# Patient Record
Sex: Male | Born: 1935 | Race: Black or African American | Hispanic: No | Marital: Married | Smoking: Never smoker
Health system: Southern US, Community
[De-identification: ages and names within clinical notes are randomized; demographics above are authoritative.]

## PROBLEM LIST (undated history)

## (undated) DIAGNOSIS — R569 Unspecified convulsions: Secondary | ICD-10-CM

## (undated) DIAGNOSIS — G309 Alzheimer's disease, unspecified: Secondary | ICD-10-CM

## (undated) DIAGNOSIS — F0280 Dementia in other diseases classified elsewhere without behavioral disturbance: Secondary | ICD-10-CM

## (undated) HISTORY — DX: Dementia in other diseases classified elsewhere, unspecified severity, without behavioral disturbance, psychotic disturbance, mood disturbance, and anxiety: F02.80

## (undated) HISTORY — PX: KNEE SURGERY: SHX244

## (undated) HISTORY — DX: Alzheimer's disease, unspecified: G30.9

---

## 2001-04-10 ENCOUNTER — Encounter: Admission: RE | Admit: 2001-04-10 | Discharge: 2001-04-10 | Payer: Self-pay | Admitting: Internal Medicine

## 2001-04-10 ENCOUNTER — Encounter: Payer: Self-pay | Admitting: Internal Medicine

## 2001-04-19 ENCOUNTER — Ambulatory Visit (HOSPITAL_COMMUNITY): Admission: RE | Admit: 2001-04-19 | Discharge: 2001-04-19 | Payer: Self-pay | Admitting: Otolaryngology

## 2001-04-19 ENCOUNTER — Encounter (INDEPENDENT_AMBULATORY_CARE_PROVIDER_SITE_OTHER): Payer: Self-pay | Admitting: *Deleted

## 2001-04-27 ENCOUNTER — Encounter: Admission: RE | Admit: 2001-04-27 | Discharge: 2001-04-27 | Payer: Self-pay | Admitting: Otolaryngology

## 2001-04-27 ENCOUNTER — Encounter: Payer: Self-pay | Admitting: Otolaryngology

## 2001-05-11 ENCOUNTER — Emergency Department (HOSPITAL_COMMUNITY): Admission: EM | Admit: 2001-05-11 | Discharge: 2001-05-11 | Payer: Self-pay | Admitting: Emergency Medicine

## 2001-05-16 ENCOUNTER — Encounter: Admission: RE | Admit: 2001-05-16 | Discharge: 2001-05-16 | Payer: Self-pay | Admitting: Hematology and Oncology

## 2001-05-16 ENCOUNTER — Encounter: Payer: Self-pay | Admitting: Hematology and Oncology

## 2001-05-18 ENCOUNTER — Emergency Department (HOSPITAL_COMMUNITY): Admission: EM | Admit: 2001-05-18 | Discharge: 2001-05-18 | Payer: Self-pay

## 2001-05-18 ENCOUNTER — Ambulatory Visit: Admission: RE | Admit: 2001-05-18 | Discharge: 2001-08-16 | Payer: Self-pay | Admitting: Radiation Oncology

## 2001-06-29 ENCOUNTER — Emergency Department (HOSPITAL_COMMUNITY): Admission: EM | Admit: 2001-06-29 | Discharge: 2001-06-29 | Payer: Self-pay | Admitting: Emergency Medicine

## 2001-07-28 ENCOUNTER — Encounter: Payer: Self-pay | Admitting: Otolaryngology

## 2001-07-28 ENCOUNTER — Ambulatory Visit (HOSPITAL_COMMUNITY): Admission: RE | Admit: 2001-07-28 | Discharge: 2001-07-28 | Payer: Self-pay | Admitting: Otolaryngology

## 2001-08-02 ENCOUNTER — Inpatient Hospital Stay (HOSPITAL_COMMUNITY): Admission: RE | Admit: 2001-08-02 | Discharge: 2001-10-10 | Payer: Self-pay | Admitting: Otolaryngology

## 2001-08-02 ENCOUNTER — Encounter: Payer: Self-pay | Admitting: Neurosurgery

## 2001-08-02 ENCOUNTER — Encounter (INDEPENDENT_AMBULATORY_CARE_PROVIDER_SITE_OTHER): Payer: Self-pay | Admitting: *Deleted

## 2001-08-03 ENCOUNTER — Encounter: Payer: Self-pay | Admitting: Neurosurgery

## 2001-08-07 ENCOUNTER — Encounter: Payer: Self-pay | Admitting: Neurosurgery

## 2001-08-07 ENCOUNTER — Encounter: Payer: Self-pay | Admitting: Otolaryngology

## 2001-08-08 ENCOUNTER — Encounter: Payer: Self-pay | Admitting: Critical Care Medicine

## 2001-08-09 ENCOUNTER — Encounter: Payer: Self-pay | Admitting: Critical Care Medicine

## 2001-08-10 ENCOUNTER — Encounter: Payer: Self-pay | Admitting: Critical Care Medicine

## 2001-08-11 ENCOUNTER — Encounter: Payer: Self-pay | Admitting: Critical Care Medicine

## 2001-08-11 ENCOUNTER — Encounter: Payer: Self-pay | Admitting: Pulmonary Disease

## 2001-08-12 ENCOUNTER — Encounter: Payer: Self-pay | Admitting: Otolaryngology

## 2001-08-13 ENCOUNTER — Encounter: Payer: Self-pay | Admitting: Neurosurgery

## 2001-08-13 ENCOUNTER — Encounter: Payer: Self-pay | Admitting: Otolaryngology

## 2001-08-14 ENCOUNTER — Encounter: Payer: Self-pay | Admitting: Otolaryngology

## 2001-08-15 ENCOUNTER — Encounter: Payer: Self-pay | Admitting: Otolaryngology

## 2001-08-16 ENCOUNTER — Encounter: Payer: Self-pay | Admitting: Pulmonary Disease

## 2001-08-17 ENCOUNTER — Encounter: Payer: Self-pay | Admitting: Otolaryngology

## 2001-08-18 ENCOUNTER — Encounter: Payer: Self-pay | Admitting: Neurosurgery

## 2001-08-18 ENCOUNTER — Encounter: Payer: Self-pay | Admitting: Otolaryngology

## 2001-08-18 ENCOUNTER — Encounter: Payer: Self-pay | Admitting: Pulmonary Disease

## 2001-08-19 ENCOUNTER — Encounter: Payer: Self-pay | Admitting: Otolaryngology

## 2001-08-20 ENCOUNTER — Encounter: Payer: Self-pay | Admitting: Neurosurgery

## 2001-08-21 ENCOUNTER — Encounter: Payer: Self-pay | Admitting: Otolaryngology

## 2001-08-21 ENCOUNTER — Encounter: Payer: Self-pay | Admitting: Neurosurgery

## 2001-08-22 ENCOUNTER — Encounter: Payer: Self-pay | Admitting: Otolaryngology

## 2001-08-24 ENCOUNTER — Encounter: Payer: Self-pay | Admitting: Critical Care Medicine

## 2001-08-25 ENCOUNTER — Encounter: Payer: Self-pay | Admitting: Otolaryngology

## 2001-08-26 ENCOUNTER — Encounter: Payer: Self-pay | Admitting: Pulmonary Disease

## 2001-08-27 ENCOUNTER — Encounter: Payer: Self-pay | Admitting: Neurosurgery

## 2001-08-28 ENCOUNTER — Encounter: Payer: Self-pay | Admitting: Neurosurgery

## 2001-08-29 ENCOUNTER — Encounter: Payer: Self-pay | Admitting: Neurosurgery

## 2001-08-30 ENCOUNTER — Encounter: Payer: Self-pay | Admitting: Otolaryngology

## 2001-08-31 ENCOUNTER — Encounter: Payer: Self-pay | Admitting: Otolaryngology

## 2001-09-01 ENCOUNTER — Encounter: Payer: Self-pay | Admitting: Neurosurgery

## 2001-09-03 ENCOUNTER — Encounter: Payer: Self-pay | Admitting: Otolaryngology

## 2001-09-03 ENCOUNTER — Encounter: Payer: Self-pay | Admitting: Pulmonary Disease

## 2001-09-04 ENCOUNTER — Encounter: Payer: Self-pay | Admitting: Otolaryngology

## 2001-09-05 ENCOUNTER — Encounter: Payer: Self-pay | Admitting: Pulmonary Disease

## 2001-09-06 ENCOUNTER — Encounter: Payer: Self-pay | Admitting: Otolaryngology

## 2001-09-07 ENCOUNTER — Encounter: Payer: Self-pay | Admitting: Otolaryngology

## 2001-09-07 ENCOUNTER — Encounter: Payer: Self-pay | Admitting: Pulmonary Disease

## 2001-09-08 ENCOUNTER — Encounter: Payer: Self-pay | Admitting: Otolaryngology

## 2001-09-09 ENCOUNTER — Encounter: Payer: Self-pay | Admitting: Otolaryngology

## 2001-09-10 ENCOUNTER — Encounter: Payer: Self-pay | Admitting: Neurosurgery

## 2001-09-11 ENCOUNTER — Encounter: Payer: Self-pay | Admitting: *Deleted

## 2001-09-11 ENCOUNTER — Encounter: Payer: Self-pay | Admitting: Neurosurgery

## 2001-09-12 ENCOUNTER — Encounter: Payer: Self-pay | Admitting: Neurosurgery

## 2001-09-12 ENCOUNTER — Encounter: Payer: Self-pay | Admitting: Critical Care Medicine

## 2001-09-13 ENCOUNTER — Encounter: Payer: Self-pay | Admitting: Neurosurgery

## 2001-09-14 ENCOUNTER — Encounter: Payer: Self-pay | Admitting: Otolaryngology

## 2001-09-14 ENCOUNTER — Encounter: Payer: Self-pay | Admitting: Neurosurgery

## 2001-09-15 ENCOUNTER — Encounter: Payer: Self-pay | Admitting: Critical Care Medicine

## 2001-09-15 ENCOUNTER — Encounter: Payer: Self-pay | Admitting: Otolaryngology

## 2001-09-16 ENCOUNTER — Encounter: Payer: Self-pay | Admitting: Otolaryngology

## 2001-09-16 ENCOUNTER — Encounter: Payer: Self-pay | Admitting: Neurosurgery

## 2001-09-18 ENCOUNTER — Encounter: Payer: Self-pay | Admitting: Neurosurgery

## 2001-09-19 ENCOUNTER — Encounter: Payer: Self-pay | Admitting: Pulmonary Disease

## 2001-09-21 ENCOUNTER — Encounter: Payer: Self-pay | Admitting: Otolaryngology

## 2001-09-23 ENCOUNTER — Encounter: Payer: Self-pay | Admitting: Otolaryngology

## 2001-09-25 ENCOUNTER — Encounter: Payer: Self-pay | Admitting: Otolaryngology

## 2001-09-27 ENCOUNTER — Encounter: Payer: Self-pay | Admitting: Otolaryngology

## 2001-09-28 ENCOUNTER — Encounter: Payer: Self-pay | Admitting: Otolaryngology

## 2001-09-30 ENCOUNTER — Encounter: Payer: Self-pay | Admitting: Otolaryngology

## 2001-11-06 ENCOUNTER — Emergency Department (HOSPITAL_COMMUNITY): Admission: EM | Admit: 2001-11-06 | Discharge: 2001-11-07 | Payer: Self-pay | Admitting: *Deleted

## 2002-01-09 ENCOUNTER — Inpatient Hospital Stay (HOSPITAL_COMMUNITY): Admission: EM | Admit: 2002-01-09 | Discharge: 2002-01-23 | Payer: Self-pay | Admitting: Emergency Medicine

## 2002-01-09 ENCOUNTER — Encounter: Payer: Self-pay | Admitting: Emergency Medicine

## 2002-01-10 ENCOUNTER — Encounter: Payer: Self-pay | Admitting: Neurosurgery

## 2002-01-11 ENCOUNTER — Encounter: Payer: Self-pay | Admitting: Neurosurgery

## 2002-02-05 ENCOUNTER — Inpatient Hospital Stay (HOSPITAL_COMMUNITY): Admission: EM | Admit: 2002-02-05 | Discharge: 2002-02-06 | Payer: Self-pay | Admitting: Emergency Medicine

## 2002-02-05 ENCOUNTER — Encounter: Payer: Self-pay | Admitting: Emergency Medicine

## 2002-02-12 ENCOUNTER — Emergency Department (HOSPITAL_COMMUNITY): Admission: EM | Admit: 2002-02-12 | Discharge: 2002-02-12 | Payer: Self-pay | Admitting: Emergency Medicine

## 2002-05-18 ENCOUNTER — Ambulatory Visit (HOSPITAL_COMMUNITY): Admission: RE | Admit: 2002-05-18 | Discharge: 2002-05-18 | Payer: Self-pay | Admitting: Gastroenterology

## 2002-07-04 ENCOUNTER — Emergency Department (HOSPITAL_COMMUNITY): Admission: EM | Admit: 2002-07-04 | Discharge: 2002-07-04 | Payer: Self-pay | Admitting: Emergency Medicine

## 2002-07-04 ENCOUNTER — Encounter: Payer: Self-pay | Admitting: Emergency Medicine

## 2002-07-20 ENCOUNTER — Encounter: Payer: Self-pay | Admitting: Internal Medicine

## 2002-07-20 ENCOUNTER — Ambulatory Visit (HOSPITAL_COMMUNITY): Admission: RE | Admit: 2002-07-20 | Discharge: 2002-07-20 | Payer: Self-pay | Admitting: Internal Medicine

## 2002-11-15 ENCOUNTER — Ambulatory Visit: Admission: RE | Admit: 2002-11-15 | Discharge: 2002-11-15 | Payer: Self-pay | Admitting: Radiation Oncology

## 2002-11-28 ENCOUNTER — Ambulatory Visit: Admission: RE | Admit: 2002-11-28 | Discharge: 2002-11-28 | Payer: Self-pay | Admitting: Radiation Oncology

## 2003-01-09 ENCOUNTER — Ambulatory Visit: Admission: RE | Admit: 2003-01-09 | Discharge: 2003-01-09 | Payer: Self-pay | Admitting: Radiation Oncology

## 2003-01-15 ENCOUNTER — Ambulatory Visit (HOSPITAL_COMMUNITY): Admission: RE | Admit: 2003-01-15 | Discharge: 2003-01-15 | Payer: Self-pay | Admitting: Radiation Oncology

## 2003-01-15 ENCOUNTER — Encounter: Payer: Self-pay | Admitting: Radiation Oncology

## 2003-01-16 ENCOUNTER — Ambulatory Visit (HOSPITAL_COMMUNITY): Admission: RE | Admit: 2003-01-16 | Discharge: 2003-01-16 | Payer: Self-pay | Admitting: Critical Care Medicine

## 2003-01-16 ENCOUNTER — Encounter: Payer: Self-pay | Admitting: Critical Care Medicine

## 2004-01-30 ENCOUNTER — Ambulatory Visit (HOSPITAL_COMMUNITY): Admission: RE | Admit: 2004-01-30 | Discharge: 2004-01-30 | Payer: Self-pay | Admitting: Radiation Oncology

## 2004-01-30 ENCOUNTER — Ambulatory Visit: Admission: RE | Admit: 2004-01-30 | Discharge: 2004-01-30 | Payer: Self-pay | Admitting: Radiation Oncology

## 2004-03-17 ENCOUNTER — Ambulatory Visit (HOSPITAL_COMMUNITY): Admission: RE | Admit: 2004-03-17 | Discharge: 2004-03-17 | Payer: Self-pay | Admitting: Radiation Oncology

## 2004-07-16 ENCOUNTER — Ambulatory Visit: Admission: RE | Admit: 2004-07-16 | Discharge: 2004-07-16 | Payer: Self-pay | Admitting: Radiation Oncology

## 2004-07-25 ENCOUNTER — Emergency Department (HOSPITAL_COMMUNITY): Admission: EM | Admit: 2004-07-25 | Discharge: 2004-07-25 | Payer: Self-pay | Admitting: Emergency Medicine

## 2004-07-30 ENCOUNTER — Emergency Department (HOSPITAL_COMMUNITY): Admission: EM | Admit: 2004-07-30 | Discharge: 2004-07-30 | Payer: Self-pay | Admitting: Emergency Medicine

## 2005-01-14 ENCOUNTER — Ambulatory Visit (HOSPITAL_COMMUNITY): Admission: RE | Admit: 2005-01-14 | Discharge: 2005-01-14 | Payer: Self-pay | Admitting: Radiation Oncology

## 2005-01-14 ENCOUNTER — Ambulatory Visit: Admission: RE | Admit: 2005-01-14 | Discharge: 2005-01-14 | Payer: Self-pay | Admitting: Radiation Oncology

## 2005-01-29 ENCOUNTER — Ambulatory Visit (HOSPITAL_COMMUNITY): Admission: RE | Admit: 2005-01-29 | Discharge: 2005-01-29 | Payer: Self-pay | Admitting: Radiation Oncology

## 2005-05-03 ENCOUNTER — Encounter: Admission: RE | Admit: 2005-05-03 | Discharge: 2005-05-03 | Payer: Self-pay | Admitting: Internal Medicine

## 2005-07-22 ENCOUNTER — Ambulatory Visit: Admission: RE | Admit: 2005-07-22 | Discharge: 2005-08-05 | Payer: Self-pay | Admitting: Otolaryngology

## 2006-01-20 ENCOUNTER — Ambulatory Visit (HOSPITAL_COMMUNITY): Admission: RE | Admit: 2006-01-20 | Discharge: 2006-01-20 | Payer: Self-pay | Admitting: Radiation Oncology

## 2006-01-27 ENCOUNTER — Ambulatory Visit (HOSPITAL_COMMUNITY): Admission: RE | Admit: 2006-01-27 | Discharge: 2006-01-27 | Payer: Self-pay | Admitting: Radiation Oncology

## 2007-01-31 ENCOUNTER — Ambulatory Visit (HOSPITAL_COMMUNITY): Admission: RE | Admit: 2007-01-31 | Discharge: 2007-01-31 | Payer: Self-pay | Admitting: Radiation Oncology

## 2007-08-23 ENCOUNTER — Emergency Department (HOSPITAL_COMMUNITY): Admission: EM | Admit: 2007-08-23 | Discharge: 2007-08-23 | Payer: Self-pay | Admitting: Emergency Medicine

## 2008-02-08 ENCOUNTER — Ambulatory Visit (HOSPITAL_COMMUNITY): Admission: RE | Admit: 2008-02-08 | Discharge: 2008-02-08 | Payer: Self-pay | Admitting: Radiation Oncology

## 2008-05-31 ENCOUNTER — Emergency Department (HOSPITAL_COMMUNITY): Admission: EM | Admit: 2008-05-31 | Discharge: 2008-05-31 | Payer: Self-pay | Admitting: Emergency Medicine

## 2009-10-03 ENCOUNTER — Ambulatory Visit (HOSPITAL_COMMUNITY): Admission: RE | Admit: 2009-10-03 | Discharge: 2009-10-03 | Payer: Self-pay | Admitting: Internal Medicine

## 2010-09-29 ENCOUNTER — Encounter: Admission: RE | Admit: 2010-09-29 | Discharge: 2010-09-29 | Payer: Self-pay | Admitting: Internal Medicine

## 2010-10-08 ENCOUNTER — Ambulatory Visit (HOSPITAL_COMMUNITY)
Admission: RE | Admit: 2010-10-08 | Discharge: 2010-10-08 | Payer: Self-pay | Source: Home / Self Care | Admitting: Gastroenterology

## 2010-10-16 ENCOUNTER — Ambulatory Visit (HOSPITAL_COMMUNITY)
Admission: RE | Admit: 2010-10-16 | Discharge: 2010-10-16 | Payer: Self-pay | Source: Home / Self Care | Attending: Gastroenterology | Admitting: Gastroenterology

## 2011-03-22 ENCOUNTER — Ambulatory Visit (HOSPITAL_COMMUNITY): Payer: Medicare Other | Attending: Internal Medicine

## 2011-03-22 DIAGNOSIS — M810 Age-related osteoporosis without current pathological fracture: Secondary | ICD-10-CM | POA: Insufficient documentation

## 2011-03-25 ENCOUNTER — Ambulatory Visit
Admission: RE | Admit: 2011-03-25 | Discharge: 2011-03-25 | Disposition: A | Discharge status: Home / Self Care | Payer: Medicare Other | Source: Ambulatory Visit | Attending: Radiation Oncology | Admitting: Radiation Oncology

## 2011-03-26 NOTE — Discharge Summary (Signed)
[place]. [facility]  Patient:    Erik Massey, Erik Massey Visit Number: [id number] MRN: [id number]          Service Type: SUR Location: 3000 [id number] 01 Attending Physician:  Susy Frizzle Dictated by:   Jeannett Senior Pollyann Kennedy, M.D. Admit Date:  08/02/2001 Disc. Date: 10/09/01                             Discharge Summary  ADMISSION DIAGNOSIS:  Esthesioneuroblastoma.  DISCHARGE DIAGNOSES: 1. Status post anterior craniofacial resection. 2. Status post severe pneumonia and adult respiratory distress syndrome.  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  He underwent preoperative radiotherapy.  He then was admitted to the hospital to undergo resection of the anterior skull base and the olfactory apparatus via combined neurosurgical and transfacial approach. He tolerated the surgery well.  The surgery was performed on the day of admission.  Postoperatively, he was extubated on postoperative day #2.  He, over the following 48 hours, developed severe respiratory distress.  He developed a severe pneumonia and respiratory failure.  He was then kept in the neurosurgical intensive care unit and treated for several weeks with antibiotics and ventilatory support as well as high dose steroids.  He developed bilateral pneumothoraces.  He was followed by several different services, including pulmonary critical care, ENT, and neurosurgery.  He eventually required tracheostomy for prolonged intubation.  He was also followed by the infectious disease service for the severe Pseudomonas pneumonia.  He was kept sedated, and, at times, paralyzed during the intubation.  He developed worsening weakness of the right side, right upper and lower extremity, and worsening trouble with his speech which he had, to some extent, preoperatively from a past injury to the head.   The other postoperative complication included on November 4, he had a seizure.  He has been treated with Dilantin since then without any further such events.  He was eventually started back on a regular diet when he was found that he could swallow and process food intraorally well.  He had a residual pneumothorax on the right side that was followed with serial x-rays and eventually it completely resolved.  He underwent physical therapy and rehabilitation for the right-sided weakness.  The tracheostomy was eventually removed.  The chest tubes and central lines were eventually removed as well.  PLAN:  Discharge to the nursing home for continued rehabilitation on the morning of December 3.  DISCHARGE MEDICATIONS:  He is instructed to continue use of the following medications:  1. Nasal saline spray several times each day.  2. Betaxolol eye drops 1 drop twice daily.  3. _____ eye drops 0.15% ophthalmic solution 1 drop t.i.d.  4. Xalatan ophthalmic 1 drop at bedtime.  5. Dilantin 100 mg 4 times daily.  6. Protonix 40 mg daily.  7. Flomax 0.4 mg at bedtime.  8. Proscar 4 mg each day.  9. Celebrex 200 mg daily. 10. Exelon 1.5 mg twice daily.  DIET:  He is to continue with a regular diet.  FOLLOWUP:  He is instructed to follow up with Dr. Pollyann Kennedy in about two or three weeks.  He will follow up with Dr. Mikal Plane as recommended by Dr. Mikal Plane.  For any other problems, his wife was instructed to contact myself. Dictated by:   Jeannett Senior Pollyann Kennedy, M.D. Attending Physician:  Serena Colonel H DD:  10/09/01 TD:  10/09/01 Job: [id number]

## 2011-03-26 NOTE — H&P (Signed)
[place]. [facility]  Patient:    ACESON, LABELL Visit Number: [id number] MRN: [id number]          Service Type: SUR Location: [phone] 01 Attending Physician:  Susy Frizzle Dictated by:   Jeannett Senior Pollyann Kennedy, M.D. Admit Date:  08/02/2001 Discharge Date: 10/10/2001                           History and Physical  ADMISSION DIAGNOSIS:  Esthesioneuroblastoma.  SERVICE:  ENT.  ATTENDING PHYSICIAN:  Jefry H. Pollyann Kennedy, M.D.  REFERRING PHYSICIAN:  Kern Reap, M.D.  HISTORY:  This is a 77 year old gentleman who presented to me with frequent nosebleeds.  He was found to have an erosive mass in the nasal cavity on the right side.  Biopsies were performed and imaging studies were performed, revealed an esthesioneuroblastoma with minimal invasion through the cribriform plate.  He underwent preoperative radiotherapy without difficulty.  He is admitted to the hospital to undergo definitive surgical resection via an anterior craniofacial approach combined with Dr. Coletta Memos of neurosurgery.  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  He is otherwise in good health.  He has some dementia that is felt to be related to Alzheimers.  He has some difficulty with his remaining right eye.  SOCIAL HISTORY:  He does not smoke or drink significant amounts.  He lives with his wife.  He lives in [place] and his daughter is a Development worker, community here in [place].  PHYSICAL EXAMINATION:  GENERAL:  He is a healthy-appearing elderly gentleman.  HEENT:  He is missing his left eye.  There is a cranial defect on the left temporoparietal area as well.  The right eye is normal to inspection.  No palpable adenopathy in the neck.  Oral cavity and pharynx and nasal cavities are clear to inspection.  CHEST:  Clear to auscultation.  HEART:   Regular rate and rhythm.  ABDOMEN:  Soft without any distention or masses.  EXTREMITIES:  Unremarkable except for weakness of the right side.  IMPRESSION:  Esthesioneuroblastoma.  PLAN:  Admit to the hospital to undergo definitive surgical removal. Dictated by:   Jeannett Senior. Pollyann Kennedy, M.D. Attending Physician:  Susy Frizzle DD:  10/20/01 TD:  10/20/01 Job: [id number]

## 2011-03-26 NOTE — Cardiovascular Report (Signed)
[place]. [facility]  Patient:    Erik, Massey Visit Number: [id number] MRN: [id number]          Service Type: MED Location: [phone] Attending Physician:  Erik Massey Dictated by:   Erik Massey, M.D. Proc. Date: 02/06/02 Admit Date:  02/05/2002   CC:         Erik Massey, M.D.  Kern Reap, M.D.  Orville Govern; Dr. Ellin Goodie office   Cardiac Catheterization  INDICATIONS:  Erik Massey is a 77 year old African-American male father of Erik Massey who resides at extended care.  The patient was admitted to [facility] yesterday with episodes of chest pain radiating to his neck associated with shortness of breath.  The patient has noticed intermittent chest pain off and on for approximately a month according to the patients wife.  Initial CK total was normal at 58.  However, the MB fraction was 5.5.  Initial troponin was elevated at 0.09.  Yesterday, an attempt was made to undergo cardiac catheterization.  However, the patient does have Alzheimers disease.  Numerous discussions were made with the patients wife, as well as the patients daughter, Dr. Renae Massey, as well as the patient discussing the procedure.  Ultimately, the patient did not allow him to be prepped in the catheterization laboratory despite Dr. Renae Massey coming to the lab.  After Dr. Renae Massey had left, the patient decided against the procedure.  Dr. Renae Massey and the patients wife had discussed matters again with the patient, as well as myself, and the patient is now brought to the catheterization laboratory with both his wife and Erik Massey in presence during the catheterization procedure.  PROCEDURE:  After premedication with Valium 4 mg intravenously, the patient was prepped and draped in the usual fashion.  The right femoral artery was punctured anteriorly and a 6-French sheath was inserted.  Diagnostic catheterization was done with a  6-French Judkins-4 left, and a right coronary catheter.  A 6-French pigtail catheter was used for biplane cine ventriculography as well as distal aortography.  Hemostasis was obtained by direct manual pressure.  HEMODYNAMIC DATA:  Central aortic pressure 134/74 and mean 101.  Left ventricular pressure 134/18.  ANGIOGRAPHIC DATA: 1.  Left main coronary artery was angiographically normal and trifurcated     into an LAD, a small intermediate, and a large dominant left circumflex     system. 2.  The LAD gave rise to a very high ostial first diagonal vessel.  The LAD     gave rise to three additional diagonal vessels; the second diagonal being     the largest.  In the apical portion of the LAD, beyond a diagonal vessel,     the apical portion of the LAD was diminutive and appeared perhaps to be     subtotally occluded.  There was faint collateralization seen of the apical     portion through a more proximal distal septal perforator. 3.  The intermediate vessel was angiographically normal and small in caliber. 4.  The circumflex vessel was a large caliber dominant vessel that gave rise     to two large marginal vessels as well as a PDA and posterolateral system. 5.  The right coronary artery was a non-dominant angiographically normal vessel     supplying the anterior RV, giving rise to an anterior RV artery, but not     supplying the PDA or PLA system. 7.  Biplane cine ventriculography revealed normal LV function.  There was a     suggestion of a borderline angiographic mitral valve prolapse.  Ejection     fraction was 70%.  No focal wall motion abnormalities were demonstrated. 8.  Distal aortography did not demonstrate any renal artery stenosis.  There     was no significant aortoiliac disease.  IMPRESSION: 1. Normal LV function. 2. Left dominant coronary system with essentially normal coronary arteries    with the exception of the most distal portion of the LAD which was    diminutive  in caliber and appeared to be subtotally occluded at the apex    with collateralization. 3. No evidence for renal artery stenosis or significant aortoiliac disease.  RECOMMENDATION:  Medical therapy. Dictated by:   Erik Massey, M.D. Attending Physician:  Erik Massey DD:  02/06/02 TD:  02/06/02 Job: [id number]

## 2011-03-26 NOTE — Discharge Summary (Signed)
[place]. [facility]  Patient:    Erik Massey, FIGG Visit Number: [id number] MRN: [id number]          Service Type: MED Location: [phone] Attending Physician:  Netta Cedars Dictated by:   Raymon Mutton, P.A. Admit Date:  02/05/2002 Disc. Date: 02/06/02   CC:         Lennette Bihari, M.D.   Discharge Summary  DATE OF BIRTH:  [birth year]  ADMITTING PHYSICIAN:  Netta Cedars, M.D.  DISCHARGING PHYSICIAN:  Dr. Tresa Endo.  DISCHARGE DIAGNOSES:  1. Chest pain, rule out myocardial infarction.  2. Status post coronary angiography.  3. Seizure disorder.  4. Status post ___________ neuroblastoma removal from the anterior skull     base.  5. Status post olfactory apparatus excision.  6. History of severe fall with left subdural hematoma and bilateral frontal     contusions now with residual right hemiparesis.  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  8. Right eye glaucoma.  9. Blindness of the left eye. 10. History of adult respiratory distress syndrome after ___________     neuroblastoma excision with bilateral pneumothoraces and prolonged     ventilatory requirement. 11. Status post __________. 12. Benign prostatic hypertrophy. 13. History of peptic ulcer disease.  HISTORY OF PRESENT ILLNESS:  Erik Massey is a 77 year old African American gentleman who is a resident of extended care facility.  He developed an episode of chest pain and neck pain with shortness of breath yesterday at 8 oclock in the morning and nurse called reporting the patients status.  Also his wife stated that he has been complaining of chest discomfort for a month or so and otherwise he was his usual state of health with no significant changes in the medical status.  In the emergency room on presentation, the patient did not complain of any chest pain and did not complain of shortness of breath.  EKG tracing was not remarkable but cardiac panel  revealed elevated CK-MB 5.5 and also elevated troponin I at 0.09, but CK fraction was within normal limits at 58.  EKG did not show any acute ST or T-wave changes.  No abnormalities other than slightly elevated heart rate at 94.  His blood pressure was within normal range, 137 systolic and 64 diastolic.  His sats were okay at 98% at room air.  He was admitted for observation and on rule out MI protocol.  LABORATORY DATA:  Sets of cardiac enzymes revealed the following:  CPK 37 and 59 on the second and third set; CK-MB 5.3 and 4.7 on second and third set, respectively; troponin I remained stable 0.08 on both second and third sets. His metabolic panel revealed BUN of 14, creatinine 0.9, sodium 141, potassium 3.7, glucose 96 this morning.  Liver function tests showed SGOT 18 and SGPT 13, _________ which was within normal range and alkaline phosphatase was 159 yesterday evening and 186 on admission, which was __________ 117.  CBC showed hemoglobin 11.7, hematocrit 34.0, platelets 207 and white blood cell count 7.4.  HOSPITAL COURSE:  Today the patient underwent coronary angiography performed by Lennette Bihari, M.D., the result of which is unavailable to me at the time of dictation but the diagram revealed no significant coronary artery disease, essentially normal coronaries except some diminished noted coronary flow and distal LAD with collateral circulation compensated with this occlusion.  Ejection fraction estimated at 70%.  Cath was performed with no difficulty and the patient developed no  complications.  The patient did not develop any complications.  For more detailed information, please refer to that dictation. He maintained sinus rhythm throughout admission but heart rate was borderline high.  PLAN:  The patient will be on continued bedrest until his post cath bedrest time expires and if stable, will be transferred back to extended care unit.  DISCHARGE MEDICATIONS:  1.  _________ 1 mg q.d.  2. Protonix 40 mg q.d.  3. Toprol XL 25 mg q.d.  4. Indocin 50 mg q.d.  5. Flomax 0.4 mg q.d.  6. Xalatan 0.05% one drop ocular dextra  7. Alphagan 0.2% one drop t.i.d. ocular dextra.  8. Betoptic ophthalmic solution 0.5% one drop b.i.d. ocular dextra.  9. Zocor 10 mg q.h.s. 10. Tegretol 400 mg b.i.d. 11. Exelon 3 mg b.i.d.  DISCHARGE ACTIVITY:  No strenuous activity for 72 hours after cath.  WOUND CARE:  The patient was allowed to take a shower but no soaks, no baths, and no rubbing of the groin puncture site, pat dry.  DISCHARGE DIET:  A 4 g sodium chloride modified diet.  SPECIAL INSTRUCTIONS:  Report any redness, bleeding, swelling or increased pain to Dr. Tresa Endo and phone number was provided.  DISCHARGE FOLLOW-UP:  He will follow up p.r.n. after cath.  Lipid status is unavailable at the time of discharge and is still pending. Dictated by:   Raymon Mutton, P.A. Attending Physician:  Netta Cedars DD:  02/06/02 TD:  02/06/02 Job: [id number]

## 2011-03-26 NOTE — Discharge Summary (Signed)
[place]. [facility]  Patient:    OLANREWAJU, OSBORN Visit Number: [id number] MRN: [id number]          Service Type: MED Location: 3000 3029 01 Attending Physician:  Olene Craven Dictated by:   Kern Reap, M.D. Admit Date:  01/09/2002 Disc. Date: 01/23/02                             Discharge Summary  ADDENDUM TO DISCHARGE SUMMARY #[id number]  DISCHARGE MEDICATIONS:  1. Flomax 0.4 mg p.o. q.d.  2. Betoptic 0.5% solution one drop OD b.i.d.  3. Xalatan ophthalmic 0.005% one drop OD q.h.s.  4. Albuterol nebulizer 2.5 mg q.6h. p.r.n.  5. Atrovent nebulizer 0.5 mg q.6h. p.r.n.  6. Protonix 40 mg p.o. q.d.  7. Alphagan 0.2% solution one drop OD q.8h.  8. Tegretol XR 400 mg p.o. b.i.d.  9. Indomethacin 50 mg p.o. b.i.d. with food. 10. Exelon 3 mg p.o. b.i.d. with breakfast and dinner. 11. Phenergan 25 mg p.o. q.6h. p.r.n. 12. Tylenol 500 mg p.o. q.6h. p.r.n. Dictated by:   Kern Reap, M.D. Attending Physician:  Olene Craven DD:  01/23/02 TD:  01/23/02 Job: [id number]

## 2011-03-26 NOTE — Op Note (Signed)
[place]. [facility]  Patient:    Erik Massey, Erik Massey Visit Number: [id number] MRN: [id number]          Service Type: SUR Location: [phone] Attending Physician:  Susy Frizzle Dictated by:   Jeannett Senior Pollyann Kennedy, M.D. Proc. Date: 08/21/01 Admit Date:  08/02/2001                             Operative Report  PREOPERATIVE DIAGNOSIS:  Ventilator dependency.  POSTOPERATIVE DIAGNOSIS:  Ventilator dependency.  OPERATION PERFORMED:  Tracheostomy.  SURGEON:  Jefry H. Pollyann Kennedy, M.D.  ANESTHESIA:  General endotracheal.  COMPLICATIONS:  None.  ESTIMATED BLOOD LOSS:  20 cc.  The patient tolerated the procedure well and was transferred back to neurosurgical intensive care unit without any change in his condition.  INDICATIONS FOR PROCEDURE:  The patient is a 77 year old gentleman who underwent resection of a olfactory neuroblastoma three weeks prior and developed pneumonia and adult respiratory distress syndrome postoperatively and has been intubated for slightly over two weeks.  The risks, benefits, alternatives and complications of the procedure were explained to the patients wife, who seemed to understand and agreed to surgery.  DESCRIPTION OF PROCEDURE:  The patient was taken to the operating room and placed on the operating table in supine position.  The patient was previously orotracheally intubated and on intravenous sedation.  1% Xylocaine with epinephrine was infiltrated into the lower cervical skin.  He had a history of tracheostomy about 30 years prior.  A vertical incision was created at the midline through dense scar tissue and electrocautery dissection was continued down through  the midline fascia.  The thyroid gland was not encountered.  The upper trachea was exposed and cleaned of overlying fascial tissue. Tracheostomy incision was created between the second and third tracheal ring, the lower flap was developed and then sutured to the cervical  skin with a chromic suture.  The orotracheal tube was then slowly removed and the airway was suctioned of thick tenacious secretions.  A #8 cuffed Shiley trach tube was placed without difficulty.  Velcro ties were used to secure the trach in place.  Nylon suture was then used to secure the shield to the cervical skin. The patient was then transferred back to the neurosurgical intensive care unit. Dictated by:   Jeannett Senior Pollyann Kennedy, M.D. Attending Physician:  Susy Frizzle DD:  08/21/01 TD:  08/21/01 Job: [id number]

## 2011-03-26 NOTE — Op Note (Signed)
[place]. [facility]  Patient:    Erik Massey, Erik Massey                      MRN: [id number] Proc. Date: 04/19/01 Attending:  Jeannett Senior. Pollyann Kennedy, M.D.                           Operative Report  PREOPERATIVE DIAGNOSIS:  Nasal mass.  POSTOPERATIVE DIAGNOSIS:  Nasal mass.  OPERATION PERFORMED:  Nasal endoscopy with biopsy of right nasal mass.  SURGEON:  Jefry H. Pollyann Kennedy, M.D.  ANESTHESIA:  General endotracheal.  COMPLICATIONS:  None.  FINDINGS:  Large friable mass, vascular in nature and emanating from the superior nasal cavity on the right side.  Frozen section evaluation consistent with ____________ neuroblastoma.  ESTIMATED BLOOD LOSS:  50 cc.  The patient tolerated the procedure well, was awakened, extubated and transferred to recovery in stable condition.  REFERRING PHYSICIAN:  Merlene Laughter. Renae Gloss, M.D.  INDICATIONS FOR PROCEDURE:  The patient is a 77 year old gentleman with a several month history of nasal obstruction and frequent nose bleeds.  The risks, benefits, alternatives and complications of the procedure were explained to the patient and his wife, who seemed to understand and agreed to surgery.  DESCRIPTION OF PROCEDURE:  The patient was taken to the operating room and placed on the operating table in the supine position.  Following induction of general endotracheal anesthesia, the nose was sprayed with Afrin spray.  A 0 degree nasal endoscopy was performed.  Photographs were taken of the nasal mass.  1% Xylocaine with epinephrine with epinephrine was infiltrated into the mass, the upper septum and the superior attachments of the middle turbinate. Large fragments of the mass were taken for biopsy with Blakesley-Weil forceps and sent for frozen section evaluation.  Additional fragments were taken and sent fresh for additional pathologic work-up.  The microdebrider was used to debulk the entire lesion within the nasal cavity and the nasal  cavity was then packed first with compressed Gelfoam up against the base of the tumor and then with a large Merocel pack that was inflated with the local anesthetic solution.  The pharynx was suctioned of blood and secretions.  The patient was then awakened, extubated and transferred to recovery. DD:  04/20/01 TD:  04/20/01 Job: [id number]

## 2011-03-26 NOTE — Op Note (Signed)
[place]. [facility]  Patient:    JANET, DECESARE Visit Number: [id number] MRN: [id number]          Service Type: SUR Location: [phone] Attending Physician:  Susy Frizzle Dictated by:   Mena Goes. Franky Macho, M.D. Proc. Date: 09/06/01 Admit Date:  08/02/2001                             Operative Report  PREOPERATIVE DIAGNOSIS:  Esthesioneuroblastoma, right nasal cavity.  POSTOPERATIVE DIAGNOSIS:  Esthesioneuroblastoma, right nasal cavity.  PROCEDURE: 1. Anterior craniofacial resection of esthesioneuroblastoma. 2. Dual patch grafting with pericranial graft taken from scalp flap.  INDICATIONS FOR PROCEDURE:  Mr. Kahlin Mark is a patient of both Dr. Brynda Peon and mine, who has an esthesioneuroblastoma.  Under a specialized protocol, he has undergone preoperative radiation for treatment of the esthesioneuroblastoma, and is admitted today for a definitive resection via combined neurosurgical ENT approach.  DESCRIPTION OF PROCEDURE:  After Dr. Pollyann Kennedy completed an anterior craniofacial resection by performing lateral rhinotomies, I performed a bifrontal craniotomy, first by making a bifrontal scalp flap extending from the right to the left side just in front of the tragus bilaterally.  After this was done, I developed a scalp flap, pulling that anteriorly, making sure that the pericranium was intact.  I then made two burr holes, one on the right side, and took this over to the midline with a craniotome.  Then using direct visualization, I was able to dissect underneath the scalp flap and resected enough bone on the left side to ensure that we had adequate exposure.  I then developed the anterior flap by dissecting the dura back from the anterior cranial fossa, so I was able to see all of the cribiforme plate.  I took down significant portion of the crista galli in order to perform this.  I was able to retract the dura without great difficulty.   After retracting the dura, I was then able to, with Dr. Brynda Peon, we were able to resect the tumor and block from the anterior fossa.  That portion of the note is also dictated in Dr. Maren Beach note.  After the esthesioneuroblastoma was removed, we then cranialized the frontal sinus bilaterally.  I then developed a pericranial flap and used that to close the opening in the dura which had been made anteriorly in order to retract it backwards.  I then performed a cranial reconstruction using pieces of the craniotomy flap that I had taken, and using skull grafts and using titanium mesh and plates.  When I thought I had achieved a good contour, I then reapproximated the scalp flap with subgaleal sutures.  I was able to close the dura posteriorly primarily using dural sutures.  Dural tackers were applied.  Gelfoam was left over the superior sagittal sinus, and there was excellent hemostasis obtained.  I then finished closing the scalp flap, placed a sterile dressing.  The patient tolerated the procedure well, was taken out of the operating room intubated, but following commands. Dictated by:   Mena Goes. Franky Macho, M.D. Attending Physician:  Susy Frizzle DD:  09/06/01 TD:  09/07/01 Job: [id number]

## 2011-03-26 NOTE — Op Note (Signed)
[place]. [facility]  Patient:    Erik Massey, Erik Massey Visit Number: [id number] MRN: [id number]          Service Type: SUR Location: 3100 3111 01 Attending Physician:  Susy Frizzle Dictated by:   Oley Balm Sung Amabile, M.D. [facility] Proc. Date: 08/18/01 Admit Date:  08/02/2001                             Operative Report  ROOM 3111  DATE OF BIRTH:  September 16, 1929.  PROCEDURE:  Bronchoscopy.  INDICATIONS:  Vent-dependent respiratory failure with right whole lung collapse.  Presumed mucus plugging.  DESCRIPTION OF PROCEDURE:  The patient is sedated and paralyzed on mechanical ventilation.  A bronchoscope was introduced via the endotracheal tube and advanced into the distal trachea.  In this position, abundant secretions were filling up the right main stem bronchus.  These were suctioned with some difficulty due to their viscosity.  It took 15 or 20 minutes of work with multiple passes of the scope to sufficiently clean out the right main stem bronchus and the bronchial tree on the right.  There were mucus plugs emanating from all lung segments on the right.  After completion of the procedure, an airways examination demonstrated diffuse bronchitic changes.  On the left side, there were minimal secretions.  The above specimens were sent for culture and sensitivity.  The patient tolerated the procedure well without complications.  A follow-up chest x-ray will be performed on the morning following this procedure or sooner if indicated. Dictated by:   Oley Balm Sung Amabile, M.D. [facility] Attending Physician:  Susy Frizzle DD:  08/18/01 TD:  08/19/01 Job: [id number]

## 2011-03-26 NOTE — Discharge Summary (Signed)
[place]. [facility]  Patient:    Erik Massey, Erik Massey Visit Number: [id number] MRN: [id number]          Service Type: MED Location: 3000 3029 01 Attending Physician:  Olene Craven Dictated by:   Kern Reap, M.D. Admit Date:  01/09/2002 Disc. Date: 01/23/02   CC:         Deanna Artis. Sharene Skeans, M.D.  Mena Goes. Franky Macho, M.D.  Barbaraann Share, M.D. [facility]   Discharge Summary  DISCHARGE DIAGNOSES:  1. Partial complex seizure, status post status epilepticus.  2. 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  3. 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  4. History of respiratory failure, status post tracheotomy with resection in     separate 2002.  5. Alzheimers dementia.  6. Benign prostatic hypertrophy.  7. Glaucoma.  8. Right upper and lower extremity weakness secondary to closed head injury.  CONSULTANTS:  1. Deanna Artis. Sharene Skeans, M.D., neurology.  2. Ronaldo Miyamoto L. Franky Macho, M.D., neurosurgery.  3. Barbaraann Share, M.D. , pulmonology.  4. Physical therapy.  PROCEDURES:  1. Electroencephalogram showed bifrontal right greater than left hemisphere     slowing, no epileptiform activity.  2. CT scan of the head showed stable appearance of the brain, extensive     bifrontal and left parietal encephalomalacia, and postoperative changes.  HOSPITAL COURSE: The patient was admitted on January 09, 2002 after noted seizure activity.  He was brought to the emergency department and was noted to be in status epilepticus.  He was loaded with Dilantin and given Ativan, and secondary to the seizure and benzodiazepines he had decline of respiratory function and he required intubation.  The patient was intubated and brought up to the intensive care unit, where his seizure subsided.  Fortunately, he was extubated the next day and able to maintain off ventilation.  Dr. Sharene Skeans was consulted and medications were adjusted.   EEG was performed, which showed bifrontal slowing without epileptiform activity.  The patient was transferred to the Floor, where secondary to seizure activity and ICU stay he had a setback in his physical therapy needs.  Physical therapy was consulted to work with the patient and it was decided that the patient would benefit from an extended stay in a rehab center again.  During the course of his hospitalization he remained seizure-free.  He did develop a right elbow possible causalgia pain that was addressed with NSAIDs and a rapid dose of steroids, which did improve.  This was similar to his previous stay in the hospital where his elbow troubled him but improved. PROBLEM #1 - SEIZURES: Dr. Sharene Skeans made the recommendation of switching the patient over to Tegretol.  His Dilantin level was increased during the hospitalization to bring him to more therapeutic range.  He was started on Tegretol.  His Tegretol level rose to 3.4 on 200 mg t.i.d.  He was subsequently switched to Tegretol XR 400 mg b.i.d., for which he will need a follow-up Tegretol level in the next 48 hours and meds adjusted.  Once Tegretol level was therapeutic Dilantin was to be discharged.  Last Dilantin check level was 34, so was subsequently discharged with anticipated achieving of goal of Tegretol.  He is to follow up with Dr. Sharene Skeans in the next two weeks.  PROBLEM #2 - INFUSIO NEUROBLASTOMA: Dr. Franky Macho saw the patient, reviewed the scans, and did not think that any further intervention was necessary at  this time.  PROBLEM #3 - RIGHT ELBOW: The patients elbow did become painful after his stay in the intensive care unit.  He was put on a short steroid taper and treated with NSAIDs, and the patients pain did improve to baseline.  PROBLEM #4 - DEBILITY: The patient was receiving home physical therapy as an outpatient and was doing quite well.  Was able to climb stairs on his own and take care of his ADLs.  With his  seizure and ICU stay he had a setback in terms of his physical therapy and was evaluated by physical therapy and recommended extended care with aggressive physical therapy, for which he is being sent to [facility] extended care.  FOLLOW-UP: The patient is to follow up with Dr. Sharene Skeans in approximately two weeks time.  He is to follow up with Dr. Barbee Shropshire a week after his release from [facility] extended care.  The patient will need a Tegretol level checked in approximately 48 hours, our goal of Tegretol being a level of 428.  He currently is on Tegretol XR 400 mg p.o. b.i.d.  Of note, his Dilantin level the day before discharge was supratherapeutic at 34.  It is being DCd at this time.  He is being discharged to [facility] extended care in stable condition.Dictated by:   Kern Reap, M.D. Attending Physician:  Olene Craven DD:  01/23/02 TD:  01/23/02 Job: [id number]

## 2011-03-26 NOTE — Op Note (Signed)
[place]. [facility]  Patient:    Erik Massey, Erik Massey Visit Number: [id number] MRN: [id number]          Service Type: SUR Location: 3100 3111 01 Attending Physician:  Erik Massey Dictated by:   Erik Massey Erik Massey, M.D. Proc. Date: 08/02/01 Admit Date:  08/02/2001                             Operative Report  PREOPERATIVE DIAGNOSIS:  Esthesia neuroblastoma, right nasal cavity.  POSTOPERATIVE DIAGNOSIS:  Esthesia neuroblastoma, right nasal cavity.  PROCEDURE:  Anterior cranial facial resection.  SURGEONS: 1. Erik Massey, M.D. 2. Erik Massey, M.D.  ASSISTANT:  Erik Massey. Erik Massey, M.D.  ANESTHESIA:  General endotracheal.  COMPLICATIONS:  None.  ESTIMATED BLOOD LOSS:  1000 cc.  FINDINGS:  Perforation of the superior nasal septum, possibly related to radiation therapy that was performed.  Defect in the cribriform plate on the right side corresponding to the location of the origin of the tumor.  No gross residual tumor present.  Frozen section of dura overlying the olfactory apparatus on the right side atypical cells possibly related to radiation, no definite neuroblastoma identified.  The patient tolerated the procedure well, was transferred to the neurosurgical intensive care unit intubated on a ventilator.  HISTORY OF PRESENT ILLNESS:  This is a 77 year old gentleman who presented to me with severe nose bleeds.  Was found on imaging and biopsies to have a right-sided esthesia neuroblastoma.  He had undergone preoperative radiotherapy.  This was according to protocol set up at [facility].  Risks, benefits, alternatives, and complications of the procedure were explained to the patient and his wife who seemed to understand and agreed to surgery.  DESCRIPTION OF PROCEDURE:  The patient was taken to the operating room, placed on the operating table in a supine position.  Following induction of general endotracheal anesthesia, the head  and face was prepped and draped in a standard fashion.  A right lateral rhinotomy approach was used.  Marking pen was used to outline the incision along the right side of nasal dorsum down around the right ala. Electrocautery was used to incise the skin and subcutaneous tissue.  Careful dissection down to the periosteum was accomplished, and the periosteum was sharply incised with a 15 scalpel.  Periosteum was elevated off the nasal bone and the nasal process of the maxilla.  The incision was continued up to a lynch incision, and the periorbita was dissected off of the ethmoid bone.  A corneal protector was used on the right side.  The pupil was checked periodically on the right side to make sure there was a good response.  The anterior ethmoid artery was identified and bipolar cautery was used to cauterize it, and sharp dissection was used to incise the artery.  The posterior ethmoid artery was left intact.  The optic nerve was not identified. The dissection was kept anterior to this.  The lacrimal sac was dissected out of the fossa, but kept intact, and the bony resection was just above the lacrimal fossa.  A lynch incision was accomplished on the left side, and a similar dissection was accomplished, also dividing the anterior ethmoid artery.  He has a prosthetic eye on the left side, and there is no need for checking pupillary function.  Bony cuts were created using combinations of curved, guarded, and straight osteotomes.  Starting the piriform appature on both sides, osteotomies  were accomplished up to approximately the level of the frontal ethmoid suture and then across the base of the nasal dorsum to completely disarticulate the nasal bones and reflect them off laterally.  The mucosa was incised as well.  The bony septal attachments to the nasal bones was divided as well with a small chisel.  The lower ethmoid cuts were created using a combination of chisel starting just above the  lacrimal fossa on both sides.  The posterior cuts were accomplished later on in the procedure.  Following the bifrontal craniotomy and retraction of the frontal lobe and separation of the dura from the olfactory apparatus, the remaining cuts were performed.  The posterior wall of the frontal sinus was separated from the anterior wall at the floor, and through several septa that were naturally occurring.  The lateral superior orbital cuts were then accomplished all the way back to the area of the cribriform and then staying just lateral to the cribriform plate bilaterally, and then across the planum sphenoidale.  The remaining orbital cuts were accomplished through the ethmoid, keeping careful traction of the globe on the right side, and the entire specimen was disarticulated and removed through the cranial cavity.  Margins were checked on the dura overlying the olfactory apparatus and were negative for any definite neuroblastoma.  Electrocautery was used as needed to provide hemostasis.  The anterior wall of the frontal sinus was polished with a diamond burr after having stripped all the mucosa.  Any fragments of bone that were projecting from the anterior wall were taken down with rongeurs.  A nice smooth frontal cavity was then created.  The facial incisions were reapproximated with 4-0 chromic in the deep layer and running 4-0 nylon on the skin.  A feeding tube was placed through the right nasal cavity.  The nasal cavities were packed with Adaptic dressing.  The remaining dural defect was repaired, and the craniotomy was closed by Dr. Franky Massey.  The patient was then transferred to neurosurgical intensive care unit in stable condition. Dictated by:   Erik Massey Erik Massey, M.D. Attending Physician:  Erik Massey DD:  08/03/01 TD:  08/03/01 Job: [id number]

## 2011-03-26 NOTE — Procedures (Signed)
[place]. [facility]  Patient:    Erik Massey, Erik Massey Visit Number: [id number] MRN: [id number]          Service Type: END Location: ENDO Attending Physician:  Charna Elizabeth Dictated by:   Anselmo Rod, M.D. Proc. Date: 05/18/02 Admit Date:  05/18/2002 Discharge Date: 05/18/2002   CC:         Kern Reap, M.D.   Procedure Report  DATE OF BIRTH:  [birth year]  REFERRING PHYSICIAN:  Kern Reap, M.D.  PROCEDURE PERFORMED:  Colonoscopy.  ENDOSCOPIST:  Anselmo Rod, M.D.  INSTRUMENT USED:  Olympus video colonoscope.  INDICATIONS FOR PROCEDURE:  Rectal bleeding with severe constipation in a 77 year old African-American male.  Rule out colonic polyps, masses, hemorrhoids, etc.  PREPROCEDURE PREPARATION:  Informed consent was procured from the patient. The patient was fasted for eight hours prior to the procedure and prepped with a bottle of magnesium citrate and a gallon of NuLytely the night prior to the procedure.  PREPROCEDURE PHYSICAL:  The patient had stable vital signs.  Neck supple. Chest clear to auscultation.  S1, S2 regular.  Abdomen soft with normal bowel sounds.  DESCRIPTION OF PROCEDURE:  The patient was placed in the left lateral decubitus position and sedated with 100 mg of Demerol and 9 mg of Versed intravenously.  An IV was placed in the patients foot as multiple attempts at the upper extremities failed.  Once the patient was adequately sedated and the Olympus video colonoscope was advanced from the rectum to the cecum without difficulty.  There were a few scattered diverticula seen throughout the colon. Small nonbleeding internal hemorrhoids were seen on retroflexion.  No masses or polyps were present.  The procedure was complete up to the cecum.  The patient tolerated the procedure well without complications.  IMPRESSION: 1. Normal-appearing colon except for a few scattered diverticula. 2. Small nonbleeding  internal hemorrhoids.  RECOMMENDATIONS: 1. A high fiber diet has been recommended for the patient. 2. Stool softeners have been advised. 3. A complete blood count checked revealed a hemoglobin of 13.5.  Further    recommendations made in follow-up.Dictated by:   Anselmo Rod, M.D.  Attending Physician:  Charna Elizabeth DD:  05/19/02 TD:  05/22/02 Job: [id number]

## 2011-03-26 NOTE — Consult Note (Signed)
[place]. [facility]  Patient:    ALEKSEY, NEWBERN Visit Number: [id number] MRN: [id number]          Service Type: SUR Location: 3000 3028 01 Attending Physician:  Susy Frizzle Dictated by:   Kern Reap, M.D. Proc. Date: 09/25/01 Admit Date:  08/02/2001   CC:         Jefry H. Pollyann Kennedy, M.D.   Consultation Report  REASON FOR CONSULTATION:  Right upper extremity pain.  HISTORY OF PRESENT ILLNESS:  A 77 year old male who is status post resection of a right olfactory neuroblastoma complicated by aspiration pneumonia, ARDS, MODS, right pneumothorax, C.difficile colitis, who has made slow gradual recovery.  Family and nursing has noticed that he has complained of some right upper extremity pain especially at the elbow for the past several days.  We were asked to see him to evaluate.  PAST MEDICAL HISTORY:  Significant for Alzheimers dementia, right-sided paralysis, upper greater than lower secondary to an accident in 1961, also associated loss of the left eye.  Recent olfactory neuroblastoma and glaucoma.  PAST SURGICAL HISTORY:  Status post resection of the olfactory neuroblastoma.  SOCIAL HISTORY:  Married with two children, on disability.  FAMILY HISTORY:  Positive for remote history of prostate cancer in an uncle, otherwise noncontributory.  ALLERGIES:  PENICILLIN.  MEDICATIONS: 1. Dilantin 100 mg p.o. q.6h. 2. Xalatan drops OD 0.005% q.h.s. 3. Alphagen drops 0.15% OD t.i.d. 4. Betoptic 0.5% b.i.d. OD. 5. Protonix 40 mg p.o. q.d. 6. Albuterol and Atrovent p.r.n.  PHYSICAL EXAMINATION:  VITAL SIGNS:  Temperature 98.7, blood pressure 130/70, pulse 88, respiratory rate 22.  GENERAL:  The patient is awake.  He will follow commands.  He is slightly dysarthric.  HEENT:  Right pupil is reactive to light.  He has arcus senilis.  His extraocular movement is intact.  The left is absent due to trauma.  Oropharynx was clear.  There was no  evidence of JVD, no bruit, no thyromegaly.  NECK:  Supple.  LUNGS:  He had diminished breath sounds bilaterally.  HEART:  Regular rate and rhythm without murmurs, rubs, or gallops.  ABDOMEN:  Soft, nontender, and nondistended.  Positive bowel sounds.  EXTREMITIES:  Examination of the right upper extremity had passive range of motion at the shoulder joint.  There was no pain elicited.  There was pain with passive range of motion at the elbow, however, there was no pain with palpation of the elbow itself.  Left was within normal limits.  Lower extremities, the patient had no clubbing, cyanosis, or edema.  NEUROLOGICAL:  The patient will follow examinations.  He does have the right upper extremity paralysis.  He will move his left and right upper and lower spontaneously.  The patient is slightly dysarthric.  LABORATORY DATA:  None recently.  IMPRESSION/PLAN:  #1 - Right upper extremity discomfort.  At this time, it is possible inflammatory arthropathy may be possibly related to early formation of constricture due to the patients debilitative state and immobility for a long period of time.  Will get an elbow film at this time.  Will start with Toradol 30 mg IV injection x 1 and start him on Celebrex 200 mg p.o. q.d.  Will check a uric acid level to rule out gout.  May need to consider steroid dosepack if symptoms do not improve.  PT and OT to continue working with the patient.  #2 - Olfactory neuroblastoma, status post resection.  Continue current treatment per neurosurgery and  ENT.  #3 - Deconditioning.  Continue with the PT and OT.  Would agree that the patient will need longterm care facility for rehab.  #4 - Alzheimers currently is stable.  Will check a film.  Laboratories on the patient including a sed rate, uric acid level, and follow up with the patient on p.r.n. basis. Dictated by:   Kern Reap, M.D. Attending Physician:  Susy Frizzle DD:  09/25/01 TD:   09/26/01 Job: [id number]

## 2011-03-26 NOTE — Consult Note (Signed)
[place]. [facility]  Patient:    WINDELL, MUSSON Visit Number: [id number] MRN: [id number]          Service Type: SUR Location: 3100 3104 01 Attending Physician:  Susy Frizzle Dictated by:   Georges Lynch Darrelyn Hillock, M.D. Proc. Date: 09/03/01 Admit Date:  08/02/2001   CC:         Barbaraann Share, M.D.             Mena Goes. Franky Macho, M.D.                          Consultation Report  HISTORY OF PRESENT ILLNESS:  The patient is a 77 year old male who I was called to see to evaluate him because of diffuse swelling and pain in his right elbow and pain in his right shoulder. He has been a patient up here in 3100 because of recent brain surgery for a neuroblastoma. He presently has tracheostomy in place, and to the best of everyones knowledge, there has been no trauma to his right shoulder, right elbow. Under the past history, it is all well documented on his chart.  PHYSICAL EXAMINATION: On physical examination, he has a diffuse swelling of his right elbow and right possible forearm. There is no erythema. He has painful motion of his elbow. I do not palpate any lymphadenopathy, and there is no ascending lymphangitis. In regards to his right shoulder, he has some pain with right shoulder motion. No signs of any septic joint in his right shoulder. The shoulder appears to be well located. His AC joint is intact. The remaining joints, his left elbow, left shoulder were fine. I see no evidence of any problems within the knees or ankles. He has no pain with motion of his hips. X-rays of his right shoulder show some degenerative arthritic changes. X-rays of the right elbow, there is 1 little area of irregularity of the medial epicondyle, and I am not sure if that is an osteomyelitis or if that is an old avulsion injury. Anyway, I did a sterile prep and aspirated his right elbow and obtained 3 cc of clear, serous fluid. There was not purulent material. I did send a  fluid for aerobic/anaerobic cultures. I will start a continuous warm moist heat pad around his elbow. I will wait and see what these cultures show. I certainly do not feel any surgery is indicated at this point, and I will call his daughter, Tyreek Clabo, who is a physician here in town. Dictated by:   Georges Lynch Darrelyn Hillock, M.D. Attending Physician:  Susy Frizzle DD:  09/03/01 TD:  09/05/01 Job: [id number]

## 2011-03-26 NOTE — Procedures (Signed)
[place]. [facility]  Patient:    KJELL, BRANNEN Visit Number: [id number] MRN: [id number]          Service Type: SUR Location: 3100 3111 01 Attending Physician:  Susy Frizzle Dictated by:   Kaylyn Layer Michelle Piper, M.D. Proc. Date: 08/03/01 Admit Date:  08/02/2001   CC:         Anesthesia Department   Procedure Report  PROCEDURE:  Insertion of spinal drain.  INDICATION FOR PROCEDURE:  I was consulted by Dr. Coletta Memos to place a lumbar drain in Mr. Breit.  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  I had the opportunity to discuss the risks and benefits with the patients wife and after giving her informed consent, we elected to proceed.  DESCRIPTION OF PROCEDURE:  The patient was sedated, was turned in the left lateral decubitus position.  The back was prepped with Betadine, and 1% Xylocaine was infiltrated locally.  At the L3-4 interspace, a #12 needle was inserted into the CSF uneventfully.  A Codman catheter was inserted 5 cm into the CSF.  The catheter was firmly affixed to the patients back, and CSF both aspirated and freely drained.  The patient was then turned supine.  There were no problems or complications.  He tolerated the procedure well. Care of the catheter is per Dr. Franky Macho. Dictated by:   Kaylyn Layer Michelle Piper, M.D. Attending Physician:  Susy Frizzle DD:  08/03/01 TD:  08/04/01 Job: [id number]

## 2011-03-26 NOTE — Consult Note (Signed)
[place]  Patient:    Erik Massey, Erik Massey Visit Number: [id number] MRN: [id number]          Service Type: MED Location: MICU 2108 01 Attending Physician:  Olene Craven Dictated by:   Deanna Artis. Sharene Skeans, M.D. Proc. Date: 01/09/02 Admit Date:  01/09/2002   CC:         Cala Bradford R. Renae Gloss, M.D.   Consultation Report  DATE OF BIRTH:  [birth year]  CHIEF COMPLAINT:  Seizures.  HISTORY OF PRESENT CONDITION:  Erik Massey is a 77 year old formerly right-handed African-American married gentleman who had new onset of seizures September 11, 2001.  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  The patient did initially well and was extubated after two days.  He then developed pneumonia, possibly aspiration type, and adult respiratory distress syndrome with respiratory failure, bilateral pneumothoraces, prolonged ventilatory requirement, Clostridium difficile.  The patient was finally treated with antibiotics, steroids, bronchodilators, and responded.  He required a tracheostomy.  During that time, he had increasing right hemiparesis and decreasing ability to communicate.  The patients seizure was treated with Dilantin by Dr. Coletta Memos.  He did not have seizure recurrence.  Levels were recurrently checked and were in the 10 to 12 mcg/dl range.  Tonight between 6 and 6:30, the patient had onset of unresponsive staring. There was no obvious clonic activity.  The patient just seemed to be distant and uncommunicative.  He seemed to improve somewhat in his level of arousal around 7 oclock and actually said a few words.  When he had recurrent seizures, call was placed to EMS, and the patient was brought to the [facility] Emergency Room at about 2045 hours.  By that time, the patient had some clonic right frontalis muscle  jerking.  The patient was given Ativan 1 mg at 2052 and 2057.  He had recurrent seizures at 2124 and received another 2 mg of Ativan.  He had recurrent seizures again at 2130 with respiratory embarrassment requiring intubation, treated with Versed, succinylcholine, and Pavulon.  Dilantin was given at 2150, about 20 minutes after the drug level returned at 10.9 mc/ml, a dose of 500 mg. Seizures recurred at 2205, probably before Dilantin had a chance to work. Pavulon was given 2 mg x 2 at 2227, 2248, and 2335.  I was finally called at 2330 and asked to assist in the management of the patient.  PAST MEDICAL HISTORY: 1. The patient had a severe two-story fall at work in 1961.  He suffered left    subdural and bilateral frontal contusions.  He had prolonged rehabilitation    with residual right hemiparesis.  Initially, he had global aphasia which    greatly improved over time.  He had no seizures. 2. 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  Before he was biopsied,    he received radiotherapy preoperatively, and a discrete lesion was removed    by Dr. Franky Macho. 3. 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  Nonetheless, he    was able to read the newspaper and fine news print with regular glasses.    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  PAST SURGICAL HISTORY:  The patient had evacuation of his subdural.  He had skull reconstruction using a rib graft.  He had resection  of the olfactory neuroblastoma.  FAMILY HISTORY:  His father died at age 16 of prostate cancer and old age. His mother is alive at age 75 and has "old age problems."  The patient has two children, one a physician (internal medicine) in [place].  There is also a remote history of prostate cancer in an uncle.  MEDICATIONS: 1. Dilantin 200 mg every 12 hours. 2. Xalatan 0.005% OD q.h.s. 3. Alphagan 0.15% OD 1 drop 3 times a day. 4. Betoptic 0.5% 1  drop OD b.i.d. 5. Protonix 40 mg p.o. q.d. 6. Albuterol and Atrovent as needed (inhalers).  ALLERGIES:  PENICILLIN.  SOCIAL HISTORY:  The patient lives with his wife.  A daughter was in attendance for this episode.  REVIEW OF SYSTEMS:  Negative for headache, fever, cough, respiratory distress, nausea, vomiting, diarrhea, bleeding disorders, shortness of breath.  The patient had obvious neurologic deficits related to prior surgery.  PHYSICAL EXAMINATION:  GENERAL:  The patient is obtunded.  VITAL SIGNS:  Temperature 98.6, resting pulse 103, respirations 14, blood pressure 152/86, pulse oximetry 100%.  HEENT:  Ear, nose, throat: No signs of infection.  NECK:  Not stiff.  The patient is intubated.  LUNGS: Clear.  HEART:  No murmurs.  Pulses normal.  ABDOMEN:  Soft.  Bowel sounds normal.  No hepatosplenomegaly.  EXTREMITIES:  Dystonia and spasticity of the right side.  NEUROLOGIC:  Mental Status: The patient was obtunded.  Cranial nerves: Right pupil is nonreactive status post iridectomy.  The fundus appears normal.  The patient has no blink, no corneal.  Positive gag, grimace on the left side greater than the right.  Motor examination: Right spastic, rigid hemiparesis.  On the left side, the patient moves the left leg and arm spontaneously and semipurposefully. Sensory examination: Withdraws on the left only.  Deep tendon reflexes were diminished.  The patient has a right extensor and left flexor plantar response.  IMPRESSION: 1. 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 2. 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 3. Respiratory distress.  PLAN:  Will change the Dilantin to 200 mg every 8 hours and watch the patients response.  The patient will receive Depacon 250 mg q.i.d. for breakthrough.  We are going to try to avoid Ativan if we can, but the patient  is  extremely agitated on the ventilator and is going to be difficult to manage without use of paralytic agents and benzodiazepines.  We will discuss care with critical care and see what we can do to avoid having to place this patient on chronic Pavulon and/or benzodiazepines and/or steroids which could very well set off a neuromuscular syndrome from which he will only slowly recover.  I appreciate the opportunity to see him and will continue to follow along with you. Dictated by:   Deanna Artis. Sharene Skeans, M.D. Attending Physician:  Olene Craven DD:  01/10/02 TD:  01/10/02 Job: [id number]

## 2011-03-26 NOTE — Consult Note (Signed)
[place]. [facility]  Patient:    Erik Massey, Erik Massey Visit Number: [id number] MRN: [id number]          Service Type: SUR Location: 3000 [id number] 01 Attending Physician:  Susy Frizzle Dictated by:   Lindaann Slough, M.D. Proc. Date: 10/09/01 Admit Date:  08/02/2001   CC:         Stefani Dama, M.D.   Consultation Report  REASON FOR CONSULTATION:  Foley catheter care.  HISTORY OF PRESENT ILLNESS:  The patient is a 77 year old male who has an indwelling Foley catheter inserted for urinary retention secondary to BPH. The patient had voiding symptoms prior to his hospitalization. He has had an indwelling Foley catheter for several months. The catheter has been replaced on a regular basis. He also has been on Proscar and Flomax. He has failed several voiding trials. Over the weekend, he had some streaks of blood passing through the Foley catheter. I was asked to see the patient for that reason.  The catheter is now draining clear urine.  PLAN:  Leave the Foley catheter indwelling for now. Continue Flomax and Proscar and will give the patient another voiding trial when his general condition improves. Dictated by:   Lindaann Slough, M.D. Attending Physician:  Susy Frizzle DD:  10/09/01 TD:  10/09/01 Job: [id number]

## 2011-05-06 ENCOUNTER — Encounter: Payer: Self-pay | Admitting: Podiatry

## 2011-11-11 DIAGNOSIS — H4011X0 Primary open-angle glaucoma, stage unspecified: Secondary | ICD-10-CM | POA: Diagnosis not present

## 2011-11-12 DIAGNOSIS — R310 Gross hematuria: Secondary | ICD-10-CM | POA: Diagnosis not present

## 2011-11-18 DIAGNOSIS — B351 Tinea unguium: Secondary | ICD-10-CM | POA: Diagnosis not present

## 2011-11-18 DIAGNOSIS — A630 Anogenital (venereal) warts: Secondary | ICD-10-CM | POA: Diagnosis not present

## 2011-11-18 DIAGNOSIS — N401 Enlarged prostate with lower urinary tract symptoms: Secondary | ICD-10-CM | POA: Diagnosis not present

## 2011-11-18 DIAGNOSIS — M79609 Pain in unspecified limb: Secondary | ICD-10-CM | POA: Diagnosis not present

## 2011-11-19 DIAGNOSIS — Z79899 Other long term (current) drug therapy: Secondary | ICD-10-CM | POA: Diagnosis not present

## 2011-11-19 DIAGNOSIS — E039 Hypothyroidism, unspecified: Secondary | ICD-10-CM | POA: Diagnosis not present

## 2012-01-26 DIAGNOSIS — K5900 Constipation, unspecified: Secondary | ICD-10-CM | POA: Diagnosis not present

## 2012-01-26 DIAGNOSIS — N182 Chronic kidney disease, stage 2 (mild): Secondary | ICD-10-CM | POA: Diagnosis not present

## 2012-01-26 DIAGNOSIS — Z23 Encounter for immunization: Secondary | ICD-10-CM | POA: Diagnosis not present

## 2012-01-26 DIAGNOSIS — K921 Melena: Secondary | ICD-10-CM | POA: Diagnosis not present

## 2012-01-26 DIAGNOSIS — E039 Hypothyroidism, unspecified: Secondary | ICD-10-CM | POA: Diagnosis not present

## 2012-01-27 ENCOUNTER — Other Ambulatory Visit: Payer: Self-pay | Admitting: Specialist

## 2012-01-27 DIAGNOSIS — H4011X0 Primary open-angle glaucoma, stage unspecified: Secondary | ICD-10-CM | POA: Diagnosis not present

## 2012-01-27 DIAGNOSIS — H5340 Unspecified visual field defects: Secondary | ICD-10-CM

## 2012-01-27 DIAGNOSIS — Z961 Presence of intraocular lens: Secondary | ICD-10-CM | POA: Diagnosis not present

## 2012-01-27 DIAGNOSIS — H47099 Other disorders of optic nerve, not elsewhere classified, unspecified eye: Secondary | ICD-10-CM | POA: Diagnosis not present

## 2012-02-01 ENCOUNTER — Ambulatory Visit
Admission: RE | Admit: 2012-02-01 | Discharge: 2012-02-01 | Disposition: A | Discharge status: Home / Self Care | Payer: Medicare Other | Source: Ambulatory Visit | Attending: Specialist | Admitting: Specialist

## 2012-02-01 DIAGNOSIS — H5340 Unspecified visual field defects: Secondary | ICD-10-CM

## 2012-02-01 DIAGNOSIS — G9389 Other specified disorders of brain: Secondary | ICD-10-CM | POA: Diagnosis not present

## 2012-02-01 DIAGNOSIS — H547 Unspecified visual loss: Secondary | ICD-10-CM | POA: Diagnosis not present

## 2012-02-01 DIAGNOSIS — G319 Degenerative disease of nervous system, unspecified: Secondary | ICD-10-CM | POA: Diagnosis not present

## 2012-02-01 MED ORDER — GADOBENATE DIMEGLUMINE 529 MG/ML IV SOLN
6.0000 mL | Freq: Once | INTRAVENOUS | Status: AC | PRN
  Administered 2012-02-01: 6 mL via INTRAVENOUS

## 2012-02-03 DIAGNOSIS — B351 Tinea unguium: Secondary | ICD-10-CM | POA: Diagnosis not present

## 2012-02-03 DIAGNOSIS — M79609 Pain in unspecified limb: Secondary | ICD-10-CM | POA: Diagnosis not present

## 2012-03-10 DIAGNOSIS — H409 Unspecified glaucoma: Secondary | ICD-10-CM | POA: Diagnosis not present

## 2012-03-10 DIAGNOSIS — Z961 Presence of intraocular lens: Secondary | ICD-10-CM | POA: Diagnosis not present

## 2012-03-10 DIAGNOSIS — H4011X0 Primary open-angle glaucoma, stage unspecified: Secondary | ICD-10-CM | POA: Diagnosis not present

## 2012-05-16 DIAGNOSIS — N390 Urinary tract infection, site not specified: Secondary | ICD-10-CM | POA: Diagnosis not present

## 2012-05-16 DIAGNOSIS — N401 Enlarged prostate with lower urinary tract symptoms: Secondary | ICD-10-CM | POA: Diagnosis not present

## 2012-05-17 DIAGNOSIS — E039 Hypothyroidism, unspecified: Secondary | ICD-10-CM | POA: Diagnosis not present

## 2012-05-17 DIAGNOSIS — Z79899 Other long term (current) drug therapy: Secondary | ICD-10-CM | POA: Diagnosis not present

## 2012-05-17 DIAGNOSIS — N182 Chronic kidney disease, stage 2 (mild): Secondary | ICD-10-CM | POA: Diagnosis not present

## 2012-05-17 DIAGNOSIS — R030 Elevated blood-pressure reading, without diagnosis of hypertension: Secondary | ICD-10-CM | POA: Diagnosis not present

## 2012-05-18 DIAGNOSIS — M79609 Pain in unspecified limb: Secondary | ICD-10-CM | POA: Diagnosis not present

## 2012-05-18 DIAGNOSIS — B351 Tinea unguium: Secondary | ICD-10-CM | POA: Diagnosis not present

## 2012-07-20 DIAGNOSIS — H4011X0 Primary open-angle glaucoma, stage unspecified: Secondary | ICD-10-CM | POA: Diagnosis not present

## 2012-07-20 DIAGNOSIS — H4720 Unspecified optic atrophy: Secondary | ICD-10-CM | POA: Diagnosis not present

## 2012-08-10 DIAGNOSIS — L84 Corns and callosities: Secondary | ICD-10-CM | POA: Diagnosis not present

## 2012-08-10 DIAGNOSIS — B351 Tinea unguium: Secondary | ICD-10-CM | POA: Diagnosis not present

## 2012-08-10 DIAGNOSIS — M2040 Other hammer toe(s) (acquired), unspecified foot: Secondary | ICD-10-CM | POA: Diagnosis not present

## 2012-08-10 DIAGNOSIS — E1149 Type 2 diabetes mellitus with other diabetic neurological complication: Secondary | ICD-10-CM | POA: Diagnosis not present

## 2012-09-22 DIAGNOSIS — Z23 Encounter for immunization: Secondary | ICD-10-CM | POA: Diagnosis not present

## 2012-10-19 DIAGNOSIS — B351 Tinea unguium: Secondary | ICD-10-CM | POA: Diagnosis not present

## 2012-10-19 DIAGNOSIS — M79609 Pain in unspecified limb: Secondary | ICD-10-CM | POA: Diagnosis not present

## 2012-10-31 DIAGNOSIS — M2550 Pain in unspecified joint: Secondary | ICD-10-CM | POA: Diagnosis not present

## 2012-10-31 DIAGNOSIS — G309 Alzheimer's disease, unspecified: Secondary | ICD-10-CM | POA: Diagnosis not present

## 2012-10-31 DIAGNOSIS — I10 Essential (primary) hypertension: Secondary | ICD-10-CM | POA: Diagnosis not present

## 2012-10-31 DIAGNOSIS — R030 Elevated blood-pressure reading, without diagnosis of hypertension: Secondary | ICD-10-CM | POA: Diagnosis not present

## 2012-10-31 DIAGNOSIS — Z79899 Other long term (current) drug therapy: Secondary | ICD-10-CM | POA: Diagnosis not present

## 2012-10-31 DIAGNOSIS — E7800 Pure hypercholesterolemia, unspecified: Secondary | ICD-10-CM | POA: Diagnosis not present

## 2012-10-31 DIAGNOSIS — E039 Hypothyroidism, unspecified: Secondary | ICD-10-CM | POA: Diagnosis not present

## 2012-10-31 DIAGNOSIS — Z0000 Encounter for general adult medical examination without abnormal findings: Secondary | ICD-10-CM | POA: Diagnosis not present

## 2012-10-31 DIAGNOSIS — N400 Enlarged prostate without lower urinary tract symptoms: Secondary | ICD-10-CM | POA: Diagnosis not present

## 2012-10-31 DIAGNOSIS — R5383 Other fatigue: Secondary | ICD-10-CM | POA: Diagnosis not present

## 2012-11-06 DIAGNOSIS — H409 Unspecified glaucoma: Secondary | ICD-10-CM | POA: Diagnosis not present

## 2012-11-06 DIAGNOSIS — R5381 Other malaise: Secondary | ICD-10-CM | POA: Diagnosis not present

## 2012-11-06 DIAGNOSIS — H538 Other visual disturbances: Secondary | ICD-10-CM | POA: Diagnosis not present

## 2012-11-06 DIAGNOSIS — H269 Unspecified cataract: Secondary | ICD-10-CM | POA: Diagnosis not present

## 2012-11-06 DIAGNOSIS — M542 Cervicalgia: Secondary | ICD-10-CM | POA: Diagnosis not present

## 2012-11-06 DIAGNOSIS — R413 Other amnesia: Secondary | ICD-10-CM | POA: Diagnosis not present

## 2012-11-06 DIAGNOSIS — H9190 Unspecified hearing loss, unspecified ear: Secondary | ICD-10-CM | POA: Diagnosis not present

## 2012-11-06 DIAGNOSIS — Z8673 Personal history of transient ischemic attack (TIA), and cerebral infarction without residual deficits: Secondary | ICD-10-CM | POA: Diagnosis not present

## 2012-11-06 DIAGNOSIS — Z923 Personal history of irradiation: Secondary | ICD-10-CM | POA: Diagnosis not present

## 2012-11-06 DIAGNOSIS — Z880 Allergy status to penicillin: Secondary | ICD-10-CM | POA: Diagnosis not present

## 2012-11-06 DIAGNOSIS — K5900 Constipation, unspecified: Secondary | ICD-10-CM | POA: Diagnosis not present

## 2012-11-06 DIAGNOSIS — Z9889 Other specified postprocedural states: Secondary | ICD-10-CM | POA: Diagnosis not present

## 2012-11-06 DIAGNOSIS — H53439 Sector or arcuate defects, unspecified eye: Secondary | ICD-10-CM | POA: Diagnosis not present

## 2012-11-06 DIAGNOSIS — G9389 Other specified disorders of brain: Secondary | ICD-10-CM | POA: Diagnosis not present

## 2012-11-06 DIAGNOSIS — F0280 Dementia in other diseases classified elsewhere without behavioral disturbance: Secondary | ICD-10-CM | POA: Diagnosis not present

## 2012-11-06 DIAGNOSIS — H4011X0 Primary open-angle glaucoma, stage unspecified: Secondary | ICD-10-CM | POA: Diagnosis not present

## 2012-11-06 DIAGNOSIS — R51 Headache: Secondary | ICD-10-CM | POA: Diagnosis not present

## 2012-11-06 DIAGNOSIS — R569 Unspecified convulsions: Secondary | ICD-10-CM | POA: Diagnosis not present

## 2012-11-06 DIAGNOSIS — R3915 Urgency of urination: Secondary | ICD-10-CM | POA: Diagnosis not present

## 2012-11-06 DIAGNOSIS — H5789 Other specified disorders of eye and adnexa: Secondary | ICD-10-CM | POA: Diagnosis not present

## 2012-11-06 DIAGNOSIS — R350 Frequency of micturition: Secondary | ICD-10-CM | POA: Diagnosis not present

## 2012-11-06 DIAGNOSIS — R42 Dizziness and giddiness: Secondary | ICD-10-CM | POA: Diagnosis not present

## 2012-11-06 DIAGNOSIS — Z970 Presence of artificial eye: Secondary | ICD-10-CM | POA: Diagnosis not present

## 2012-11-12 DIAGNOSIS — S069X9A Unspecified intracranial injury with loss of consciousness of unspecified duration, initial encounter: Secondary | ICD-10-CM | POA: Diagnosis not present

## 2012-11-12 DIAGNOSIS — F0390 Unspecified dementia without behavioral disturbance: Secondary | ICD-10-CM | POA: Diagnosis not present

## 2012-11-12 DIAGNOSIS — I69959 Hemiplegia and hemiparesis following unspecified cerebrovascular disease affecting unspecified side: Secondary | ICD-10-CM | POA: Diagnosis not present

## 2012-11-12 DIAGNOSIS — I69919 Unspecified symptoms and signs involving cognitive functions following unspecified cerebrovascular disease: Secondary | ICD-10-CM | POA: Diagnosis not present

## 2012-11-12 DIAGNOSIS — R269 Unspecified abnormalities of gait and mobility: Secondary | ICD-10-CM | POA: Diagnosis not present

## 2012-11-15 DIAGNOSIS — H53439 Sector or arcuate defects, unspecified eye: Secondary | ICD-10-CM | POA: Diagnosis not present

## 2012-11-15 DIAGNOSIS — H409 Unspecified glaucoma: Secondary | ICD-10-CM | POA: Diagnosis not present

## 2012-11-15 DIAGNOSIS — I69919 Unspecified symptoms and signs involving cognitive functions following unspecified cerebrovascular disease: Secondary | ICD-10-CM | POA: Diagnosis not present

## 2012-11-15 DIAGNOSIS — R269 Unspecified abnormalities of gait and mobility: Secondary | ICD-10-CM | POA: Diagnosis not present

## 2012-11-15 DIAGNOSIS — S069X9A Unspecified intracranial injury with loss of consciousness of unspecified duration, initial encounter: Secondary | ICD-10-CM | POA: Diagnosis not present

## 2012-11-15 DIAGNOSIS — H4011X0 Primary open-angle glaucoma, stage unspecified: Secondary | ICD-10-CM | POA: Diagnosis not present

## 2012-11-15 DIAGNOSIS — F0390 Unspecified dementia without behavioral disturbance: Secondary | ICD-10-CM | POA: Diagnosis not present

## 2012-11-15 DIAGNOSIS — Z970 Presence of artificial eye: Secondary | ICD-10-CM | POA: Diagnosis not present

## 2012-11-15 DIAGNOSIS — I69959 Hemiplegia and hemiparesis following unspecified cerebrovascular disease affecting unspecified side: Secondary | ICD-10-CM | POA: Diagnosis not present

## 2012-11-17 DIAGNOSIS — F0390 Unspecified dementia without behavioral disturbance: Secondary | ICD-10-CM | POA: Diagnosis not present

## 2012-11-17 DIAGNOSIS — I69919 Unspecified symptoms and signs involving cognitive functions following unspecified cerebrovascular disease: Secondary | ICD-10-CM | POA: Diagnosis not present

## 2012-11-17 DIAGNOSIS — I69959 Hemiplegia and hemiparesis following unspecified cerebrovascular disease affecting unspecified side: Secondary | ICD-10-CM | POA: Diagnosis not present

## 2012-11-17 DIAGNOSIS — S069X9A Unspecified intracranial injury with loss of consciousness of unspecified duration, initial encounter: Secondary | ICD-10-CM | POA: Diagnosis not present

## 2012-11-17 DIAGNOSIS — R269 Unspecified abnormalities of gait and mobility: Secondary | ICD-10-CM | POA: Diagnosis not present

## 2012-11-20 DIAGNOSIS — I69959 Hemiplegia and hemiparesis following unspecified cerebrovascular disease affecting unspecified side: Secondary | ICD-10-CM | POA: Diagnosis not present

## 2012-11-20 DIAGNOSIS — S069X9A Unspecified intracranial injury with loss of consciousness of unspecified duration, initial encounter: Secondary | ICD-10-CM | POA: Diagnosis not present

## 2012-11-20 DIAGNOSIS — I69919 Unspecified symptoms and signs involving cognitive functions following unspecified cerebrovascular disease: Secondary | ICD-10-CM | POA: Diagnosis not present

## 2012-11-20 DIAGNOSIS — R269 Unspecified abnormalities of gait and mobility: Secondary | ICD-10-CM | POA: Diagnosis not present

## 2012-11-20 DIAGNOSIS — F0390 Unspecified dementia without behavioral disturbance: Secondary | ICD-10-CM | POA: Diagnosis not present

## 2012-11-21 DIAGNOSIS — I69919 Unspecified symptoms and signs involving cognitive functions following unspecified cerebrovascular disease: Secondary | ICD-10-CM | POA: Diagnosis not present

## 2012-11-21 DIAGNOSIS — S069X9A Unspecified intracranial injury with loss of consciousness of unspecified duration, initial encounter: Secondary | ICD-10-CM | POA: Diagnosis not present

## 2012-11-21 DIAGNOSIS — I69959 Hemiplegia and hemiparesis following unspecified cerebrovascular disease affecting unspecified side: Secondary | ICD-10-CM | POA: Diagnosis not present

## 2012-11-21 DIAGNOSIS — R269 Unspecified abnormalities of gait and mobility: Secondary | ICD-10-CM | POA: Diagnosis not present

## 2012-11-21 DIAGNOSIS — F0390 Unspecified dementia without behavioral disturbance: Secondary | ICD-10-CM | POA: Diagnosis not present

## 2012-11-24 DIAGNOSIS — I69919 Unspecified symptoms and signs involving cognitive functions following unspecified cerebrovascular disease: Secondary | ICD-10-CM | POA: Diagnosis not present

## 2012-11-24 DIAGNOSIS — S069X9A Unspecified intracranial injury with loss of consciousness of unspecified duration, initial encounter: Secondary | ICD-10-CM | POA: Diagnosis not present

## 2012-11-24 DIAGNOSIS — I69959 Hemiplegia and hemiparesis following unspecified cerebrovascular disease affecting unspecified side: Secondary | ICD-10-CM | POA: Diagnosis not present

## 2012-11-24 DIAGNOSIS — R269 Unspecified abnormalities of gait and mobility: Secondary | ICD-10-CM | POA: Diagnosis not present

## 2012-11-24 DIAGNOSIS — F0390 Unspecified dementia without behavioral disturbance: Secondary | ICD-10-CM | POA: Diagnosis not present

## 2012-11-27 DIAGNOSIS — F0390 Unspecified dementia without behavioral disturbance: Secondary | ICD-10-CM | POA: Diagnosis not present

## 2012-11-27 DIAGNOSIS — I69919 Unspecified symptoms and signs involving cognitive functions following unspecified cerebrovascular disease: Secondary | ICD-10-CM | POA: Diagnosis not present

## 2012-11-27 DIAGNOSIS — S069X9A Unspecified intracranial injury with loss of consciousness of unspecified duration, initial encounter: Secondary | ICD-10-CM | POA: Diagnosis not present

## 2012-11-27 DIAGNOSIS — R269 Unspecified abnormalities of gait and mobility: Secondary | ICD-10-CM | POA: Diagnosis not present

## 2012-11-27 DIAGNOSIS — I69959 Hemiplegia and hemiparesis following unspecified cerebrovascular disease affecting unspecified side: Secondary | ICD-10-CM | POA: Diagnosis not present

## 2012-11-29 DIAGNOSIS — I69919 Unspecified symptoms and signs involving cognitive functions following unspecified cerebrovascular disease: Secondary | ICD-10-CM | POA: Diagnosis not present

## 2012-11-29 DIAGNOSIS — F0390 Unspecified dementia without behavioral disturbance: Secondary | ICD-10-CM | POA: Diagnosis not present

## 2012-11-29 DIAGNOSIS — S069X9A Unspecified intracranial injury with loss of consciousness of unspecified duration, initial encounter: Secondary | ICD-10-CM | POA: Diagnosis not present

## 2012-11-29 DIAGNOSIS — I69959 Hemiplegia and hemiparesis following unspecified cerebrovascular disease affecting unspecified side: Secondary | ICD-10-CM | POA: Diagnosis not present

## 2012-11-29 DIAGNOSIS — R269 Unspecified abnormalities of gait and mobility: Secondary | ICD-10-CM | POA: Diagnosis not present

## 2012-12-01 DIAGNOSIS — I69959 Hemiplegia and hemiparesis following unspecified cerebrovascular disease affecting unspecified side: Secondary | ICD-10-CM | POA: Diagnosis not present

## 2012-12-01 DIAGNOSIS — R269 Unspecified abnormalities of gait and mobility: Secondary | ICD-10-CM | POA: Diagnosis not present

## 2012-12-01 DIAGNOSIS — F0390 Unspecified dementia without behavioral disturbance: Secondary | ICD-10-CM | POA: Diagnosis not present

## 2012-12-01 DIAGNOSIS — S069X9A Unspecified intracranial injury with loss of consciousness of unspecified duration, initial encounter: Secondary | ICD-10-CM | POA: Diagnosis not present

## 2012-12-01 DIAGNOSIS — I69919 Unspecified symptoms and signs involving cognitive functions following unspecified cerebrovascular disease: Secondary | ICD-10-CM | POA: Diagnosis not present

## 2012-12-04 DIAGNOSIS — I69919 Unspecified symptoms and signs involving cognitive functions following unspecified cerebrovascular disease: Secondary | ICD-10-CM | POA: Diagnosis not present

## 2012-12-04 DIAGNOSIS — S069X9A Unspecified intracranial injury with loss of consciousness of unspecified duration, initial encounter: Secondary | ICD-10-CM | POA: Diagnosis not present

## 2012-12-04 DIAGNOSIS — F0390 Unspecified dementia without behavioral disturbance: Secondary | ICD-10-CM | POA: Diagnosis not present

## 2012-12-04 DIAGNOSIS — R269 Unspecified abnormalities of gait and mobility: Secondary | ICD-10-CM | POA: Diagnosis not present

## 2012-12-04 DIAGNOSIS — I69959 Hemiplegia and hemiparesis following unspecified cerebrovascular disease affecting unspecified side: Secondary | ICD-10-CM | POA: Diagnosis not present

## 2012-12-05 DIAGNOSIS — S069X9A Unspecified intracranial injury with loss of consciousness of unspecified duration, initial encounter: Secondary | ICD-10-CM | POA: Diagnosis not present

## 2012-12-05 DIAGNOSIS — R269 Unspecified abnormalities of gait and mobility: Secondary | ICD-10-CM | POA: Diagnosis not present

## 2012-12-05 DIAGNOSIS — F0390 Unspecified dementia without behavioral disturbance: Secondary | ICD-10-CM | POA: Diagnosis not present

## 2012-12-05 DIAGNOSIS — I69959 Hemiplegia and hemiparesis following unspecified cerebrovascular disease affecting unspecified side: Secondary | ICD-10-CM | POA: Diagnosis not present

## 2012-12-05 DIAGNOSIS — I69919 Unspecified symptoms and signs involving cognitive functions following unspecified cerebrovascular disease: Secondary | ICD-10-CM | POA: Diagnosis not present

## 2012-12-08 DIAGNOSIS — I69919 Unspecified symptoms and signs involving cognitive functions following unspecified cerebrovascular disease: Secondary | ICD-10-CM | POA: Diagnosis not present

## 2012-12-08 DIAGNOSIS — I69959 Hemiplegia and hemiparesis following unspecified cerebrovascular disease affecting unspecified side: Secondary | ICD-10-CM | POA: Diagnosis not present

## 2012-12-08 DIAGNOSIS — S069X9A Unspecified intracranial injury with loss of consciousness of unspecified duration, initial encounter: Secondary | ICD-10-CM | POA: Diagnosis not present

## 2012-12-08 DIAGNOSIS — R269 Unspecified abnormalities of gait and mobility: Secondary | ICD-10-CM | POA: Diagnosis not present

## 2012-12-08 DIAGNOSIS — F0390 Unspecified dementia without behavioral disturbance: Secondary | ICD-10-CM | POA: Diagnosis not present

## 2012-12-11 DIAGNOSIS — R269 Unspecified abnormalities of gait and mobility: Secondary | ICD-10-CM | POA: Diagnosis not present

## 2012-12-11 DIAGNOSIS — S069X9A Unspecified intracranial injury with loss of consciousness of unspecified duration, initial encounter: Secondary | ICD-10-CM | POA: Diagnosis not present

## 2012-12-11 DIAGNOSIS — I69919 Unspecified symptoms and signs involving cognitive functions following unspecified cerebrovascular disease: Secondary | ICD-10-CM | POA: Diagnosis not present

## 2012-12-11 DIAGNOSIS — F0390 Unspecified dementia without behavioral disturbance: Secondary | ICD-10-CM | POA: Diagnosis not present

## 2012-12-11 DIAGNOSIS — I69959 Hemiplegia and hemiparesis following unspecified cerebrovascular disease affecting unspecified side: Secondary | ICD-10-CM | POA: Diagnosis not present

## 2012-12-14 DIAGNOSIS — F0390 Unspecified dementia without behavioral disturbance: Secondary | ICD-10-CM | POA: Diagnosis not present

## 2012-12-14 DIAGNOSIS — I69919 Unspecified symptoms and signs involving cognitive functions following unspecified cerebrovascular disease: Secondary | ICD-10-CM | POA: Diagnosis not present

## 2012-12-14 DIAGNOSIS — I69959 Hemiplegia and hemiparesis following unspecified cerebrovascular disease affecting unspecified side: Secondary | ICD-10-CM | POA: Diagnosis not present

## 2012-12-14 DIAGNOSIS — S069X9A Unspecified intracranial injury with loss of consciousness of unspecified duration, initial encounter: Secondary | ICD-10-CM | POA: Diagnosis not present

## 2012-12-14 DIAGNOSIS — R269 Unspecified abnormalities of gait and mobility: Secondary | ICD-10-CM | POA: Diagnosis not present

## 2012-12-19 DIAGNOSIS — I69959 Hemiplegia and hemiparesis following unspecified cerebrovascular disease affecting unspecified side: Secondary | ICD-10-CM | POA: Diagnosis not present

## 2012-12-19 DIAGNOSIS — S069X9A Unspecified intracranial injury with loss of consciousness of unspecified duration, initial encounter: Secondary | ICD-10-CM | POA: Diagnosis not present

## 2012-12-19 DIAGNOSIS — R269 Unspecified abnormalities of gait and mobility: Secondary | ICD-10-CM | POA: Diagnosis not present

## 2012-12-19 DIAGNOSIS — F0390 Unspecified dementia without behavioral disturbance: Secondary | ICD-10-CM | POA: Diagnosis not present

## 2012-12-19 DIAGNOSIS — I69919 Unspecified symptoms and signs involving cognitive functions following unspecified cerebrovascular disease: Secondary | ICD-10-CM | POA: Diagnosis not present

## 2012-12-26 DIAGNOSIS — F0390 Unspecified dementia without behavioral disturbance: Secondary | ICD-10-CM | POA: Diagnosis not present

## 2012-12-26 DIAGNOSIS — S069X9A Unspecified intracranial injury with loss of consciousness of unspecified duration, initial encounter: Secondary | ICD-10-CM | POA: Diagnosis not present

## 2012-12-26 DIAGNOSIS — I69959 Hemiplegia and hemiparesis following unspecified cerebrovascular disease affecting unspecified side: Secondary | ICD-10-CM | POA: Diagnosis not present

## 2012-12-26 DIAGNOSIS — R269 Unspecified abnormalities of gait and mobility: Secondary | ICD-10-CM | POA: Diagnosis not present

## 2012-12-26 DIAGNOSIS — I69919 Unspecified symptoms and signs involving cognitive functions following unspecified cerebrovascular disease: Secondary | ICD-10-CM | POA: Diagnosis not present

## 2012-12-28 DIAGNOSIS — F0390 Unspecified dementia without behavioral disturbance: Secondary | ICD-10-CM | POA: Diagnosis not present

## 2012-12-28 DIAGNOSIS — I69959 Hemiplegia and hemiparesis following unspecified cerebrovascular disease affecting unspecified side: Secondary | ICD-10-CM | POA: Diagnosis not present

## 2012-12-28 DIAGNOSIS — I69919 Unspecified symptoms and signs involving cognitive functions following unspecified cerebrovascular disease: Secondary | ICD-10-CM | POA: Diagnosis not present

## 2012-12-28 DIAGNOSIS — S069X9A Unspecified intracranial injury with loss of consciousness of unspecified duration, initial encounter: Secondary | ICD-10-CM | POA: Diagnosis not present

## 2012-12-28 DIAGNOSIS — R269 Unspecified abnormalities of gait and mobility: Secondary | ICD-10-CM | POA: Diagnosis not present

## 2013-01-01 DIAGNOSIS — R269 Unspecified abnormalities of gait and mobility: Secondary | ICD-10-CM | POA: Diagnosis not present

## 2013-01-01 DIAGNOSIS — I69959 Hemiplegia and hemiparesis following unspecified cerebrovascular disease affecting unspecified side: Secondary | ICD-10-CM | POA: Diagnosis not present

## 2013-01-01 DIAGNOSIS — I69919 Unspecified symptoms and signs involving cognitive functions following unspecified cerebrovascular disease: Secondary | ICD-10-CM | POA: Diagnosis not present

## 2013-01-01 DIAGNOSIS — F0390 Unspecified dementia without behavioral disturbance: Secondary | ICD-10-CM | POA: Diagnosis not present

## 2013-01-01 DIAGNOSIS — S069X9A Unspecified intracranial injury with loss of consciousness of unspecified duration, initial encounter: Secondary | ICD-10-CM | POA: Diagnosis not present

## 2013-01-02 DIAGNOSIS — I69919 Unspecified symptoms and signs involving cognitive functions following unspecified cerebrovascular disease: Secondary | ICD-10-CM | POA: Diagnosis not present

## 2013-01-02 DIAGNOSIS — R269 Unspecified abnormalities of gait and mobility: Secondary | ICD-10-CM | POA: Diagnosis not present

## 2013-01-02 DIAGNOSIS — F0390 Unspecified dementia without behavioral disturbance: Secondary | ICD-10-CM | POA: Diagnosis not present

## 2013-01-02 DIAGNOSIS — S069X9A Unspecified intracranial injury with loss of consciousness of unspecified duration, initial encounter: Secondary | ICD-10-CM | POA: Diagnosis not present

## 2013-01-02 DIAGNOSIS — I69959 Hemiplegia and hemiparesis following unspecified cerebrovascular disease affecting unspecified side: Secondary | ICD-10-CM | POA: Diagnosis not present

## 2013-02-22 DIAGNOSIS — Z9181 History of falling: Secondary | ICD-10-CM | POA: Diagnosis not present

## 2013-02-22 DIAGNOSIS — F0280 Dementia in other diseases classified elsewhere without behavioral disturbance: Secondary | ICD-10-CM | POA: Diagnosis not present

## 2013-02-22 DIAGNOSIS — R42 Dizziness and giddiness: Secondary | ICD-10-CM | POA: Diagnosis not present

## 2013-02-22 DIAGNOSIS — G309 Alzheimer's disease, unspecified: Secondary | ICD-10-CM | POA: Diagnosis not present

## 2013-03-14 DIAGNOSIS — M79609 Pain in unspecified limb: Secondary | ICD-10-CM | POA: Diagnosis not present

## 2013-03-14 DIAGNOSIS — B351 Tinea unguium: Secondary | ICD-10-CM | POA: Diagnosis not present

## 2013-04-04 DIAGNOSIS — H409 Unspecified glaucoma: Secondary | ICD-10-CM | POA: Diagnosis not present

## 2013-04-04 DIAGNOSIS — H4011X0 Primary open-angle glaucoma, stage unspecified: Secondary | ICD-10-CM | POA: Diagnosis not present

## 2013-04-11 DIAGNOSIS — Z87820 Personal history of traumatic brain injury: Secondary | ICD-10-CM | POA: Diagnosis not present

## 2013-04-11 DIAGNOSIS — F0390 Unspecified dementia without behavioral disturbance: Secondary | ICD-10-CM | POA: Diagnosis not present

## 2013-04-11 DIAGNOSIS — R7989 Other specified abnormal findings of blood chemistry: Secondary | ICD-10-CM | POA: Diagnosis not present

## 2013-05-21 DIAGNOSIS — N401 Enlarged prostate with lower urinary tract symptoms: Secondary | ICD-10-CM | POA: Diagnosis not present

## 2013-06-06 DIAGNOSIS — M79609 Pain in unspecified limb: Secondary | ICD-10-CM | POA: Diagnosis not present

## 2013-06-06 DIAGNOSIS — B351 Tinea unguium: Secondary | ICD-10-CM | POA: Diagnosis not present

## 2013-06-21 DIAGNOSIS — N390 Urinary tract infection, site not specified: Secondary | ICD-10-CM | POA: Diagnosis not present

## 2013-11-07 DIAGNOSIS — H409 Unspecified glaucoma: Secondary | ICD-10-CM | POA: Diagnosis not present

## 2013-11-07 DIAGNOSIS — Z23 Encounter for immunization: Secondary | ICD-10-CM | POA: Diagnosis not present

## 2013-11-07 DIAGNOSIS — H4011X0 Primary open-angle glaucoma, stage unspecified: Secondary | ICD-10-CM | POA: Diagnosis not present

## 2013-11-20 DIAGNOSIS — Z0000 Encounter for general adult medical examination without abnormal findings: Secondary | ICD-10-CM | POA: Diagnosis not present

## 2013-11-20 DIAGNOSIS — E559 Vitamin D deficiency, unspecified: Secondary | ICD-10-CM | POA: Diagnosis not present

## 2013-11-20 DIAGNOSIS — E039 Hypothyroidism, unspecified: Secondary | ICD-10-CM | POA: Diagnosis not present

## 2013-11-20 DIAGNOSIS — G309 Alzheimer's disease, unspecified: Secondary | ICD-10-CM | POA: Diagnosis not present

## 2013-11-20 DIAGNOSIS — F0280 Dementia in other diseases classified elsewhere without behavioral disturbance: Secondary | ICD-10-CM | POA: Diagnosis not present

## 2013-11-20 DIAGNOSIS — R7309 Other abnormal glucose: Secondary | ICD-10-CM | POA: Diagnosis not present

## 2013-11-20 DIAGNOSIS — N400 Enlarged prostate without lower urinary tract symptoms: Secondary | ICD-10-CM | POA: Diagnosis not present

## 2013-11-30 DIAGNOSIS — L723 Sebaceous cyst: Secondary | ICD-10-CM | POA: Diagnosis not present

## 2013-11-30 DIAGNOSIS — N139 Obstructive and reflux uropathy, unspecified: Secondary | ICD-10-CM | POA: Diagnosis not present

## 2013-11-30 DIAGNOSIS — N401 Enlarged prostate with lower urinary tract symptoms: Secondary | ICD-10-CM | POA: Diagnosis not present

## 2014-02-26 DIAGNOSIS — G309 Alzheimer's disease, unspecified: Secondary | ICD-10-CM | POA: Diagnosis not present

## 2014-02-26 DIAGNOSIS — F0280 Dementia in other diseases classified elsewhere without behavioral disturbance: Secondary | ICD-10-CM | POA: Diagnosis not present

## 2014-02-26 DIAGNOSIS — E039 Hypothyroidism, unspecified: Secondary | ICD-10-CM | POA: Diagnosis not present

## 2014-02-26 DIAGNOSIS — H918X9 Other specified hearing loss, unspecified ear: Secondary | ICD-10-CM | POA: Diagnosis not present

## 2014-02-26 DIAGNOSIS — K5900 Constipation, unspecified: Secondary | ICD-10-CM | POA: Diagnosis not present

## 2014-04-03 DIAGNOSIS — H6120 Impacted cerumen, unspecified ear: Secondary | ICD-10-CM | POA: Diagnosis not present

## 2014-04-03 DIAGNOSIS — H903 Sensorineural hearing loss, bilateral: Secondary | ICD-10-CM | POA: Diagnosis not present

## 2014-04-17 DIAGNOSIS — Z961 Presence of intraocular lens: Secondary | ICD-10-CM | POA: Diagnosis not present

## 2014-04-17 DIAGNOSIS — H4011X0 Primary open-angle glaucoma, stage unspecified: Secondary | ICD-10-CM | POA: Diagnosis not present

## 2014-04-17 DIAGNOSIS — H409 Unspecified glaucoma: Secondary | ICD-10-CM | POA: Diagnosis not present

## 2014-05-28 DIAGNOSIS — E039 Hypothyroidism, unspecified: Secondary | ICD-10-CM | POA: Diagnosis not present

## 2014-05-29 ENCOUNTER — Ambulatory Visit (INDEPENDENT_AMBULATORY_CARE_PROVIDER_SITE_OTHER): Payer: Medicare Other | Admitting: Podiatry

## 2014-05-29 ENCOUNTER — Encounter: Payer: Self-pay | Admitting: Podiatry

## 2014-05-29 VITALS — BP 122/88 | HR 69 | Resp 16 | Ht 70.0 in | Wt 135.0 lb

## 2014-05-29 DIAGNOSIS — B351 Tinea unguium: Secondary | ICD-10-CM | POA: Diagnosis not present

## 2014-05-29 DIAGNOSIS — M79609 Pain in unspecified limb: Secondary | ICD-10-CM

## 2014-05-29 DIAGNOSIS — M79673 Pain in unspecified foot: Secondary | ICD-10-CM

## 2014-05-29 NOTE — Progress Notes (Signed)
Trim the toenails and calluses

## 2014-05-30 NOTE — Progress Notes (Signed)
Subjective:     Patient ID: Erik Massey, male   DOB: October 08, 1929, 80 y.o.   MRN: [id number]  HPI poor health individual with painful nail bed 1-5 both feet that are impossible for him to her caregiver to cut and air painful   Review of Systems     Objective:   Physical Exam Neurovascular status unchanged with thick yellow brittle nailbeds 1-5 both feet    Assessment:     Mycotic nail infection is with pain 1-5 both feet    Plan:     Debris painful nailbeds 1-5 both feet with no iatrogenic bleeding noted

## 2014-06-03 DIAGNOSIS — N139 Obstructive and reflux uropathy, unspecified: Secondary | ICD-10-CM | POA: Diagnosis not present

## 2014-06-03 DIAGNOSIS — N401 Enlarged prostate with lower urinary tract symptoms: Secondary | ICD-10-CM | POA: Diagnosis not present

## 2014-08-15 DIAGNOSIS — Z23 Encounter for immunization: Secondary | ICD-10-CM | POA: Diagnosis not present

## 2014-09-02 ENCOUNTER — Ambulatory Visit (INDEPENDENT_AMBULATORY_CARE_PROVIDER_SITE_OTHER): Payer: Medicare Other | Admitting: Podiatry

## 2014-09-02 ENCOUNTER — Encounter: Payer: Self-pay | Admitting: Podiatry

## 2014-09-02 DIAGNOSIS — M79673 Pain in unspecified foot: Secondary | ICD-10-CM | POA: Diagnosis not present

## 2014-09-02 DIAGNOSIS — B351 Tinea unguium: Secondary | ICD-10-CM | POA: Diagnosis not present

## 2014-09-02 NOTE — Progress Notes (Signed)
   Subjective:    Patient ID: Erik Massey, male    DOB: 1929-03-01, 80 y.o.   MRN: [id number]  HPI   Pt presents for nail debridement Review of Systems     Objective:   Physical Exam        Assessment & Plan:

## 2014-09-02 NOTE — Progress Notes (Signed)
Subjective:     Patient ID: Erik Massey, male   DOB: [birth year], 80 y.o.   MRN: [id number]  HPI patient presents with thick nail bed 1-5 both feet that are painful and impossible for him or caregivers to cut   Review of Systems     Objective:   Physical Exam Neurovascular status intact with yellow brittle painful nail bed 1-5 both feet that he cannot cut    Assessment:     Mycotic nail infection with pain and chronic deformity    Plan:     Debride painful nailbeds 1-5 both feet with no iatrogenic bleeding noted

## 2014-10-15 DIAGNOSIS — H4011X3 Primary open-angle glaucoma, severe stage: Secondary | ICD-10-CM | POA: Diagnosis not present

## 2014-10-15 DIAGNOSIS — H548 Legal blindness, as defined in USA: Secondary | ICD-10-CM | POA: Diagnosis not present

## 2014-12-05 DIAGNOSIS — N401 Enlarged prostate with lower urinary tract symptoms: Secondary | ICD-10-CM | POA: Diagnosis not present

## 2014-12-05 DIAGNOSIS — R350 Frequency of micturition: Secondary | ICD-10-CM | POA: Diagnosis not present

## 2014-12-05 DIAGNOSIS — R351 Nocturia: Secondary | ICD-10-CM | POA: Diagnosis not present

## 2014-12-09 ENCOUNTER — Ambulatory Visit (INDEPENDENT_AMBULATORY_CARE_PROVIDER_SITE_OTHER): Payer: Medicare Other | Admitting: Podiatry

## 2014-12-09 DIAGNOSIS — M79673 Pain in unspecified foot: Secondary | ICD-10-CM | POA: Diagnosis not present

## 2014-12-09 DIAGNOSIS — B351 Tinea unguium: Secondary | ICD-10-CM

## 2014-12-09 NOTE — Progress Notes (Signed)
Subjective:     Patient ID: Erik Massey, male   DOB: [birth year], 81 y.o.   MRN: [id number]  HPI patient presents with thick nail bed 1-5 both feet that are painful and impossible for him or caregivers to cut   Review of Systems     Objective:   Physical Exam Neurovascular status intact with yellow brittle painful nail bed 1-5 both feet that he cannot cut    Assessment:     Mycotic nail infection with pain and chronic deformity    Plan:     Debride painful nailbeds 1-5 both feet with no iatrogenic bleeding noted

## 2015-02-26 DIAGNOSIS — Z8522 Personal history of malignant neoplasm of nasal cavities, middle ear, and accessory sinuses: Secondary | ICD-10-CM | POA: Diagnosis not present

## 2015-02-26 DIAGNOSIS — Z9181 History of falling: Secondary | ICD-10-CM | POA: Diagnosis not present

## 2015-02-26 DIAGNOSIS — M21371 Foot drop, right foot: Secondary | ICD-10-CM | POA: Diagnosis not present

## 2015-02-26 DIAGNOSIS — R262 Difficulty in walking, not elsewhere classified: Secondary | ICD-10-CM | POA: Diagnosis not present

## 2015-02-26 DIAGNOSIS — M1990 Unspecified osteoarthritis, unspecified site: Secondary | ICD-10-CM | POA: Diagnosis not present

## 2015-02-26 DIAGNOSIS — R279 Unspecified lack of coordination: Secondary | ICD-10-CM | POA: Diagnosis not present

## 2015-02-26 DIAGNOSIS — G309 Alzheimer's disease, unspecified: Secondary | ICD-10-CM | POA: Diagnosis not present

## 2015-03-04 DIAGNOSIS — G309 Alzheimer's disease, unspecified: Secondary | ICD-10-CM | POA: Diagnosis not present

## 2015-03-04 DIAGNOSIS — M21371 Foot drop, right foot: Secondary | ICD-10-CM | POA: Diagnosis not present

## 2015-03-04 DIAGNOSIS — R262 Difficulty in walking, not elsewhere classified: Secondary | ICD-10-CM | POA: Diagnosis not present

## 2015-03-04 DIAGNOSIS — Z9181 History of falling: Secondary | ICD-10-CM | POA: Diagnosis not present

## 2015-03-04 DIAGNOSIS — R279 Unspecified lack of coordination: Secondary | ICD-10-CM | POA: Diagnosis not present

## 2015-03-04 DIAGNOSIS — M1990 Unspecified osteoarthritis, unspecified site: Secondary | ICD-10-CM | POA: Diagnosis not present

## 2015-03-05 DIAGNOSIS — Z0000 Encounter for general adult medical examination without abnormal findings: Secondary | ICD-10-CM | POA: Diagnosis not present

## 2015-03-05 DIAGNOSIS — I10 Essential (primary) hypertension: Secondary | ICD-10-CM | POA: Diagnosis not present

## 2015-03-05 DIAGNOSIS — E039 Hypothyroidism, unspecified: Secondary | ICD-10-CM | POA: Diagnosis not present

## 2015-03-05 DIAGNOSIS — E782 Mixed hyperlipidemia: Secondary | ICD-10-CM | POA: Diagnosis not present

## 2015-03-05 DIAGNOSIS — R351 Nocturia: Secondary | ICD-10-CM | POA: Diagnosis not present

## 2015-03-05 DIAGNOSIS — K5900 Constipation, unspecified: Secondary | ICD-10-CM | POA: Diagnosis not present

## 2015-03-05 DIAGNOSIS — R5383 Other fatigue: Secondary | ICD-10-CM | POA: Diagnosis not present

## 2015-03-05 DIAGNOSIS — R195 Other fecal abnormalities: Secondary | ICD-10-CM | POA: Diagnosis not present

## 2015-03-05 DIAGNOSIS — F0281 Dementia in other diseases classified elsewhere with behavioral disturbance: Secondary | ICD-10-CM | POA: Diagnosis not present

## 2015-03-05 DIAGNOSIS — R7309 Other abnormal glucose: Secondary | ICD-10-CM | POA: Diagnosis not present

## 2015-03-05 DIAGNOSIS — E559 Vitamin D deficiency, unspecified: Secondary | ICD-10-CM | POA: Diagnosis not present

## 2015-03-06 DIAGNOSIS — M21371 Foot drop, right foot: Secondary | ICD-10-CM | POA: Diagnosis not present

## 2015-03-06 DIAGNOSIS — M1990 Unspecified osteoarthritis, unspecified site: Secondary | ICD-10-CM | POA: Diagnosis not present

## 2015-03-06 DIAGNOSIS — R262 Difficulty in walking, not elsewhere classified: Secondary | ICD-10-CM | POA: Diagnosis not present

## 2015-03-06 DIAGNOSIS — G309 Alzheimer's disease, unspecified: Secondary | ICD-10-CM | POA: Diagnosis not present

## 2015-03-06 DIAGNOSIS — Z9181 History of falling: Secondary | ICD-10-CM | POA: Diagnosis not present

## 2015-03-06 DIAGNOSIS — R279 Unspecified lack of coordination: Secondary | ICD-10-CM | POA: Diagnosis not present

## 2015-03-10 DIAGNOSIS — G309 Alzheimer's disease, unspecified: Secondary | ICD-10-CM | POA: Diagnosis not present

## 2015-03-10 DIAGNOSIS — R279 Unspecified lack of coordination: Secondary | ICD-10-CM | POA: Diagnosis not present

## 2015-03-10 DIAGNOSIS — Z9181 History of falling: Secondary | ICD-10-CM | POA: Diagnosis not present

## 2015-03-10 DIAGNOSIS — M1990 Unspecified osteoarthritis, unspecified site: Secondary | ICD-10-CM | POA: Diagnosis not present

## 2015-03-10 DIAGNOSIS — R262 Difficulty in walking, not elsewhere classified: Secondary | ICD-10-CM | POA: Diagnosis not present

## 2015-03-10 DIAGNOSIS — M21371 Foot drop, right foot: Secondary | ICD-10-CM | POA: Diagnosis not present

## 2015-03-12 DIAGNOSIS — M1990 Unspecified osteoarthritis, unspecified site: Secondary | ICD-10-CM | POA: Diagnosis not present

## 2015-03-12 DIAGNOSIS — R262 Difficulty in walking, not elsewhere classified: Secondary | ICD-10-CM | POA: Diagnosis not present

## 2015-03-12 DIAGNOSIS — R279 Unspecified lack of coordination: Secondary | ICD-10-CM | POA: Diagnosis not present

## 2015-03-12 DIAGNOSIS — Z9181 History of falling: Secondary | ICD-10-CM | POA: Diagnosis not present

## 2015-03-12 DIAGNOSIS — G309 Alzheimer's disease, unspecified: Secondary | ICD-10-CM | POA: Diagnosis not present

## 2015-03-12 DIAGNOSIS — M21371 Foot drop, right foot: Secondary | ICD-10-CM | POA: Diagnosis not present

## 2015-03-14 DIAGNOSIS — Z9181 History of falling: Secondary | ICD-10-CM | POA: Diagnosis not present

## 2015-03-14 DIAGNOSIS — M1990 Unspecified osteoarthritis, unspecified site: Secondary | ICD-10-CM | POA: Diagnosis not present

## 2015-03-14 DIAGNOSIS — R279 Unspecified lack of coordination: Secondary | ICD-10-CM | POA: Diagnosis not present

## 2015-03-14 DIAGNOSIS — G309 Alzheimer's disease, unspecified: Secondary | ICD-10-CM | POA: Diagnosis not present

## 2015-03-14 DIAGNOSIS — R262 Difficulty in walking, not elsewhere classified: Secondary | ICD-10-CM | POA: Diagnosis not present

## 2015-03-14 DIAGNOSIS — M21371 Foot drop, right foot: Secondary | ICD-10-CM | POA: Diagnosis not present

## 2015-03-17 ENCOUNTER — Ambulatory Visit: Payer: Medicare Other

## 2015-03-17 DIAGNOSIS — M21371 Foot drop, right foot: Secondary | ICD-10-CM | POA: Diagnosis not present

## 2015-03-17 DIAGNOSIS — G309 Alzheimer's disease, unspecified: Secondary | ICD-10-CM | POA: Diagnosis not present

## 2015-03-17 DIAGNOSIS — R279 Unspecified lack of coordination: Secondary | ICD-10-CM | POA: Diagnosis not present

## 2015-03-17 DIAGNOSIS — M1990 Unspecified osteoarthritis, unspecified site: Secondary | ICD-10-CM | POA: Diagnosis not present

## 2015-03-17 DIAGNOSIS — R262 Difficulty in walking, not elsewhere classified: Secondary | ICD-10-CM | POA: Diagnosis not present

## 2015-03-17 DIAGNOSIS — Z9181 History of falling: Secondary | ICD-10-CM | POA: Diagnosis not present

## 2015-03-18 ENCOUNTER — Ambulatory Visit (INDEPENDENT_AMBULATORY_CARE_PROVIDER_SITE_OTHER): Payer: Medicare Other

## 2015-03-18 DIAGNOSIS — B351 Tinea unguium: Secondary | ICD-10-CM

## 2015-03-18 DIAGNOSIS — M79673 Pain in unspecified foot: Secondary | ICD-10-CM | POA: Diagnosis not present

## 2015-03-20 DIAGNOSIS — R262 Difficulty in walking, not elsewhere classified: Secondary | ICD-10-CM | POA: Diagnosis not present

## 2015-03-20 DIAGNOSIS — R279 Unspecified lack of coordination: Secondary | ICD-10-CM | POA: Diagnosis not present

## 2015-03-20 DIAGNOSIS — Z9181 History of falling: Secondary | ICD-10-CM | POA: Diagnosis not present

## 2015-03-20 DIAGNOSIS — M1990 Unspecified osteoarthritis, unspecified site: Secondary | ICD-10-CM | POA: Diagnosis not present

## 2015-03-20 DIAGNOSIS — M21371 Foot drop, right foot: Secondary | ICD-10-CM | POA: Diagnosis not present

## 2015-03-20 DIAGNOSIS — G309 Alzheimer's disease, unspecified: Secondary | ICD-10-CM | POA: Diagnosis not present

## 2015-03-26 DIAGNOSIS — G309 Alzheimer's disease, unspecified: Secondary | ICD-10-CM | POA: Diagnosis not present

## 2015-03-26 DIAGNOSIS — G40909 Epilepsy, unspecified, not intractable, without status epilepticus: Secondary | ICD-10-CM | POA: Diagnosis not present

## 2015-03-26 DIAGNOSIS — Z87891 Personal history of nicotine dependence: Secondary | ICD-10-CM | POA: Diagnosis not present

## 2015-03-26 DIAGNOSIS — R195 Other fecal abnormalities: Secondary | ICD-10-CM | POA: Diagnosis not present

## 2015-03-27 DIAGNOSIS — M21371 Foot drop, right foot: Secondary | ICD-10-CM | POA: Diagnosis not present

## 2015-03-27 DIAGNOSIS — R279 Unspecified lack of coordination: Secondary | ICD-10-CM | POA: Diagnosis not present

## 2015-03-27 DIAGNOSIS — Z9181 History of falling: Secondary | ICD-10-CM | POA: Diagnosis not present

## 2015-03-27 DIAGNOSIS — M1990 Unspecified osteoarthritis, unspecified site: Secondary | ICD-10-CM | POA: Diagnosis not present

## 2015-03-27 DIAGNOSIS — R262 Difficulty in walking, not elsewhere classified: Secondary | ICD-10-CM | POA: Diagnosis not present

## 2015-03-27 DIAGNOSIS — G309 Alzheimer's disease, unspecified: Secondary | ICD-10-CM | POA: Diagnosis not present

## 2015-04-02 DIAGNOSIS — H6123 Impacted cerumen, bilateral: Secondary | ICD-10-CM | POA: Diagnosis not present

## 2015-04-02 DIAGNOSIS — H903 Sensorineural hearing loss, bilateral: Secondary | ICD-10-CM | POA: Diagnosis not present

## 2015-04-15 DIAGNOSIS — H4011X3 Primary open-angle glaucoma, severe stage: Secondary | ICD-10-CM | POA: Diagnosis not present

## 2015-04-24 DIAGNOSIS — N138 Other obstructive and reflux uropathy: Secondary | ICD-10-CM | POA: Diagnosis not present

## 2015-04-24 DIAGNOSIS — N401 Enlarged prostate with lower urinary tract symptoms: Secondary | ICD-10-CM | POA: Diagnosis not present

## 2015-04-24 DIAGNOSIS — R351 Nocturia: Secondary | ICD-10-CM | POA: Diagnosis not present

## 2015-04-24 DIAGNOSIS — R350 Frequency of micturition: Secondary | ICD-10-CM | POA: Diagnosis not present

## 2015-06-05 NOTE — Progress Notes (Signed)
Subjective:     Patient ID: Erik Massey, male   DOB: [birth year], 81 y.o.   MRN: [id number]  HPI patient presents with thick nail bed 1-5 both feet that are painful and impossible for him or caregivers to cut   Review of Systems     Objective:   Physical Exam Neurovascular status intact with yellow brittle painful nail bed 1-5 both feet that he cannot cut    Assessment:     Mycotic nail infection with pain and chronic deformity    Plan:     Debride painful nailbeds 1-5 both feet with no iatrogenic bleeding noted

## 2015-06-18 ENCOUNTER — Ambulatory Visit (INDEPENDENT_AMBULATORY_CARE_PROVIDER_SITE_OTHER): Payer: Medicare Other | Admitting: Podiatry

## 2015-06-18 ENCOUNTER — Encounter: Payer: Self-pay | Admitting: Podiatry

## 2015-06-18 VITALS — BP 122/73 | HR 76 | Resp 16

## 2015-06-18 DIAGNOSIS — M79673 Pain in unspecified foot: Secondary | ICD-10-CM

## 2015-06-18 DIAGNOSIS — B351 Tinea unguium: Secondary | ICD-10-CM | POA: Diagnosis not present

## 2015-06-18 NOTE — Progress Notes (Signed)
Subjective:     Patient ID: KATHRYN LINAREZ, male   DOB: 17-Oct-1929, 81 y.o.   MRN: [id number]  HPIThis patient presents to the office for preventive foot care services.  He says his nail have grown long and thick and he is unable to self treat.   Review of Systems     Objective:   Physical Exam GENERAL APPEARANCE: Alert, conversant. Appropriately groomed. No acute distress.  VASCULAR: Pedal pulses palpable at  [facility] and PT bilateral.  Capillary refill time is immediate to all digits,  Cold feet noted.l  NEUROLOGIC: sensation is normal to 5.07 monofilament at 5/5 sites bilateral.  Light touch is intact bilateral, Muscle strength normal.  MUSCULOSKELETAL: acceptable muscle strength, tone and stability bilateral.  Intrinsic muscluature intact bilateral.  Rectus appearance of foot and digits noted bilateral. Severely contracted digits left foot with overlapping second toe left foot.  DERMATOLOGIC: skin color, texture, and turgor are within normal limits.  No preulcerative lesions or ulcers  are seen, no interdigital maceration noted.  No open lesions present.  No drainage noted.  MAILS  Thick disfigured discolored nails both feet      Assessment:     Onychomycosis     Plan:     Debridement of Nails both feet.  RTC 3 months

## 2015-07-01 ENCOUNTER — Ambulatory Visit: Payer: Medicare Other | Admitting: Podiatry

## 2015-09-11 ENCOUNTER — Ambulatory Visit: Payer: Medicare Other | Admitting: Podiatry

## 2015-09-26 ENCOUNTER — Emergency Department (HOSPITAL_COMMUNITY)
Admission: EM | Admit: 2015-09-26 | Discharge: 2015-09-26 | Disposition: A | Discharge status: Home / Self Care | Payer: Medicare Other | Attending: Emergency Medicine | Admitting: Emergency Medicine

## 2015-09-26 ENCOUNTER — Emergency Department (HOSPITAL_COMMUNITY): Payer: Medicare Other

## 2015-09-26 ENCOUNTER — Emergency Department (EMERGENCY_DEPARTMENT_HOSPITAL): Payer: Medicare Other

## 2015-09-26 DIAGNOSIS — M7989 Other specified soft tissue disorders: Secondary | ICD-10-CM

## 2015-09-26 DIAGNOSIS — R296 Repeated falls: Secondary | ICD-10-CM | POA: Diagnosis not present

## 2015-09-26 DIAGNOSIS — R531 Weakness: Secondary | ICD-10-CM | POA: Diagnosis not present

## 2015-09-26 DIAGNOSIS — G309 Alzheimer's disease, unspecified: Secondary | ICD-10-CM | POA: Insufficient documentation

## 2015-09-26 DIAGNOSIS — M79609 Pain in unspecified limb: Secondary | ICD-10-CM | POA: Diagnosis not present

## 2015-09-26 DIAGNOSIS — G8220 Paraplegia, unspecified: Secondary | ICD-10-CM | POA: Insufficient documentation

## 2015-09-26 DIAGNOSIS — Z880 Allergy status to penicillin: Secondary | ICD-10-CM | POA: Diagnosis not present

## 2015-09-26 DIAGNOSIS — R600 Localized edema: Secondary | ICD-10-CM | POA: Diagnosis not present

## 2015-09-26 DIAGNOSIS — Z87820 Personal history of traumatic brain injury: Secondary | ICD-10-CM | POA: Insufficient documentation

## 2015-09-26 DIAGNOSIS — D649 Anemia, unspecified: Secondary | ICD-10-CM | POA: Diagnosis not present

## 2015-09-26 DIAGNOSIS — Z79899 Other long term (current) drug therapy: Secondary | ICD-10-CM | POA: Insufficient documentation

## 2015-09-26 DIAGNOSIS — R269 Unspecified abnormalities of gait and mobility: Secondary | ICD-10-CM | POA: Diagnosis not present

## 2015-09-26 DIAGNOSIS — Z970 Presence of artificial eye: Secondary | ICD-10-CM | POA: Diagnosis not present

## 2015-09-26 DIAGNOSIS — M6281 Muscle weakness (generalized): Secondary | ICD-10-CM | POA: Diagnosis not present

## 2015-09-26 DIAGNOSIS — K5900 Constipation, unspecified: Secondary | ICD-10-CM | POA: Insufficient documentation

## 2015-09-26 LAB — BASIC METABOLIC PANEL
Anion gap: 6 (ref 5–15)
BUN: 23 mg/dL — AB (ref 6–20)
CHLORIDE: 108 mmol/L (ref 101–111)
CO2: 25 mmol/L (ref 22–32)
Calcium: 8.8 mg/dL — ABNORMAL LOW (ref 8.9–10.3)
Creatinine, Ser: 1.06 mg/dL (ref 0.61–1.24)
GFR calc Af Amer: >60 mL/min (ref >60)
GFR calc non Af Amer: >60 mL/min (ref >60)
GLUCOSE: 74 mg/dL (ref 65–99)
POTASSIUM: 3.9 mmol/L (ref 3.5–5.1)
Sodium: 139 mmol/L (ref 135–145)

## 2015-09-26 LAB — CBC
HEMATOCRIT: 30.9 % — AB (ref 39.0–52.0)
HEMOGLOBIN: 10.2 g/dL — AB (ref 13.0–17.0)
MCH: 30.1 pg (ref 26.0–34.0)
MCHC: 33 g/dL (ref 30.0–36.0)
MCV: 91.2 fL (ref 78.0–100.0)
Platelets: 170 10*3/uL (ref 150–400)
RBC: 3.39 MIL/uL — ABNORMAL LOW (ref 4.22–5.81)
RDW: 14 % (ref 11.5–15.5)
WBC: 3.9 10*3/uL — AB (ref 4.0–10.5)

## 2015-09-26 LAB — CBG MONITORING, ED: Glucose-Capillary: 109 mg/dL — ABNORMAL HIGH (ref 65–99)

## 2015-09-26 NOTE — ED Notes (Addendum)
According to pt wife, pt has bilateral knee and leg pain after falling on oct 13. Pain is getting progressively worse and is affecting his mobility. Pt. Has been falling more frequently since. Hx alzheimer's.

## 2015-09-26 NOTE — Discharge Instructions (Signed)
Fall Prevention in the Home  Falls can cause injuries. They can happen to people of all ages. There are many things you can do to make your home safe and to help prevent falls.  WHAT CAN I DO ON THE OUTSIDE OF MY HOME?  Regularly fix the edges of walkways and driveways and fix any cracks.  Remove anything that might make you trip as you walk through a door, such as a raised step or threshold.  Trim any bushes or trees on the path to your home.  Use bright outdoor lighting.  Clear any walking paths of anything that might make someone trip, such as rocks or tools.  Regularly check to see if handrails are loose or broken. Make sure that both sides of any steps have handrails.  Any raised decks and porches should have guardrails on the edges.  Have any leaves, snow, or ice cleared regularly.  Use sand or salt on walking paths during winter.  Clean up any spills in your garage right away. This includes oil or grease spills. WHAT CAN I DO IN THE BATHROOM?   Use night lights.  Install grab bars by the toilet and in the tub and shower. Do not use towel bars as grab bars.  Use non-skid mats or decals in the tub or shower.  If you need to sit down in the shower, use a plastic, non-slip stool.  Keep the floor dry. Clean up any water that spills on the floor as soon as it happens.  Remove soap buildup in the tub or shower regularly.  Attach bath mats securely with double-sided non-slip rug tape.  Do not have throw rugs and other things on the floor that can make you trip. WHAT CAN I DO IN THE BEDROOM?  Use night lights.  Make sure that you have a light by your bed that is easy to reach.  Do not use any sheets or blankets that are too big for your bed. They should not hang down onto the floor.  Have a firm chair that has side arms. You can use this for support while you get dressed.  Do not have throw rugs and other things on the floor that can make you trip. WHAT CAN I DO IN  THE KITCHEN?  Clean up any spills right away.  Avoid walking on wet floors.  Keep items that you use a lot in easy-to-reach places.  If you need to reach something above you, use a strong step stool that has a grab bar.  Keep electrical cords out of the way.  Do not use floor polish or wax that makes floors slippery. If you must use wax, use non-skid floor wax.  Do not have throw rugs and other things on the floor that can make you trip. WHAT CAN I DO WITH MY STAIRS?  Do not leave any items on the stairs.  Make sure that there are handrails on both sides of the stairs and use them. Fix handrails that are broken or loose. Make sure that handrails are as long as the stairways.  Check any carpeting to make sure that it is firmly attached to the stairs. Fix any carpet that is loose or worn.  Avoid having throw rugs at the top or bottom of the stairs. If you do have throw rugs, attach them to the floor with carpet tape.  Make sure that you have a light switch at the top of the stairs and the bottom of the stairs.  If you do not have them, ask someone to add them for you. WHAT ELSE CAN I DO TO HELP PREVENT FALLS?  Wear shoes that:  Do not have high heels.  Have rubber bottoms.  Are comfortable and fit you well.  Are closed at the toe. Do not wear sandals.  If you use a stepladder:  Make sure that it is fully opened. Do not climb a closed stepladder.  Make sure that both sides of the stepladder are locked into place.  Ask someone to hold it for you, if possible.  Clearly mark and make sure that you can see:  Any grab bars or handrails.  First and last steps.  Where the edge of each step is.  Use tools that help you move around (mobility aids) if they are needed. These include:  Canes.  Walkers.  Scooters.  Crutches.  Turn on the lights when you go into a dark area. Replace any light bulbs as soon as they burn out.  Set up your furniture so you have a clear  path. Avoid moving your furniture around.  If any of your floors are uneven, fix them.  If there are any pets around you, be aware of where they are.  Review your medicines with your doctor. Some medicines can make you feel dizzy. This can increase your chance of falling. Ask your doctor what other things that you can do to help prevent falls.   This information is not intended to replace advice given to you by your health care provider. Make sure you discuss any questions you have with your health care provider.   Document Released: 08/21/2009 Document Revised: 03/11/2015 Document Reviewed: 11/29/2014 Elsevier Interactive Patient Education 2016 Elsevier Inc.  Weakness Weakness is a lack of strength. You may feel weak all over your body or just in one part of your body. Weakness can be serious. In some cases, you may need more medical tests. HOME [place] a well-balanced diet.  Try to exercise every day.  Only take medicines as told by your doctor. GET HELP RIGHT AWAY IF:   You cannot do your normal daily activities.  You cannot walk up and down stairs, or you feel very tired when you do so.  You have shortness of breath or chest pain.  You have trouble moving parts of your body.  You have weakness in only one body part or on only one side of the body.  You have a fever.  You have trouble speaking or swallowing.  You cannot control when you pee (urinate) or poop (bowel movement).  You have black or bloody throw up (vomit) or poop.  Your weakness gets worse or spreads to other body parts.  You have new aches or pains. MAKE SURE YOU:   Understand these instructions.  Will watch your condition.  Will get help right away if you are not doing well or get worse.   This information is not intended to replace advice given to you by your health care provider. Make sure you discuss any questions you have with your health care provider.   Document Released:  10/07/2008 Document Revised: 04/25/2012 Document Reviewed: 12/24/2011 Elsevier Interactive Patient Education [employer].

## 2015-09-26 NOTE — ED Provider Notes (Signed)
L5 caveat dementia. History is obtained from patient's wife. Patient's wife reports the patient has had frequent falls over the past several months. He last fell 2 days ago. Patient denies pain anywhere. He has chronic weakness since head injury in 1961 as rosella fall. No new change in weakness. Walks with cane. He has been ambulatory today. No change in behavior. His been eating well. On exam chronically ill-appearing cachectic. H ENT exam temporal wasting normocephalic atraumatic neck supple trachea midline lungs clear to auscultation abdomen nondistended nontender.  left lower extremity with 2+ pretibial pitting edema. Not red or warm nontender. All other extremities or contusion abrasion or tenderness Norvasc intact. Entire spine nontender. Pelvis stable nontender.  Nursing was unable to obtain blood from patient. Procedure :I perform phlebotomy. Time out performed. Left groin was prepped with chlorhexidine. There is a 21-gauge needle with a syringe and it successfully obtain blood from left femoral vein. No hematoma after blood draw  Orlie Dakin, MD 09/26/15 1800

## 2015-09-26 NOTE — ED Notes (Signed)
Attempted blood draw with no success.     

## 2015-09-26 NOTE — ED Notes (Signed)
MD at bedside. 

## 2015-09-26 NOTE — Progress Notes (Addendum)
   09/26/15 0000  CM Assessment  Expected Discharge [place]  In-house Referral NA  Discharge Planning Services CM Consult  [facility]  Choice offered to / list presented to  Spouse  Status of Service Completed, signed off  Discharge Disposition Home w [place]    CM reviewed in details medicare guidelines, home health [facility]) (length of stay in home, types of [facility] staff available, coverage, primary caregiver, up to 24 hrs before services may be started) and Private duty nursing (PDN-coverage, length of stay in the home types of staff available) plus [facility] ([facility]) CM reviewed availability of HH SW to assist pcp to get pt to snf (if desired disposition) from the community level. CM provided pt/family with a list of [place] home health agencies, information on [facility] and PDN. Wife wants to consult daughter prior to making any decisions Pt asleep during entire conversation Discussed once pt leave [facility] ED ED P unable to provide further orders/assistance  PCP is a participating [facility] provider as verified on [facility] web site   Cm sent an EPIC in basket message for pcp [facility] [place] Left Cm contact number for questions EDP updated by ED PM CM Amy Wife reports pt has cane, can not use walker related to paralysis of one side but has a wheel chair and denies need of a bedside commode

## 2015-09-26 NOTE — Progress Notes (Signed)
VASCULAR LAB PRELIMINARY  PRELIMINARY  PRELIMINARY  PRELIMINARY  Left lower extremity venous duplex completed.    Preliminary report:  Left:  No evidence of DVT, superficial thrombosis, or Baker's cyst.  Samantha Ragen, RVS 09/26/2015, 4:57 PM

## 2015-09-26 NOTE — ED Notes (Signed)
Hold on UA per DR. Lenna Sciara

## 2015-09-26 NOTE — ED Notes (Signed)
EDP J STATES NO NEED FOR IV AT THIS TIME

## 2015-09-26 NOTE — ED Provider Notes (Signed)
CSN: [id number]     Arrival date & time 09/26/15  1233 History   First MD Initiated Contact with Patient 09/26/15 1504     Chief Complaint  Patient presents with  . Weakness     (Consider location/radiation/quality/duration/timing/severity/associated sxs/prior Treatment) The history is provided by the spouse. The history is limited by the condition of the patient.     Pt presents with progressively worsening mobility and increasing unwitnessed falls, most recent was in the bathroom 2 days ago.  History is provided by his wife, who is a poor historian, and the pt is reportedly at his baseline mental status with dementia.  He will answer simple questions, but his wife reports that he will not complain of pain.  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  His wife reports more frequent falls over the last month, most recent was two days ago when she heard him fall downstairs in the bathroom.  She does not believe he hit his head at that time, and she states he is not on blood thinners.  He has been eating and drinking normally, has not had fever, sweats, cough, N, V, D.  He is chronically constipated and treatment has been difficult, due to his wife's inability to quickly get to to the commode with treatments such as miralax.  Last bowel movement was 2 days ago, hard stool, no hematochezia or melena. His wife's main concern is possible pain or injury to his legs, because although he does not complain of pain, he appears to have pain when bearing weight. There are no other acute issues reported.  No abdominal pain, weight loss, chest pain, syncope, shortness of breath, change in mental status.   Past Medical History  Diagnosis Date  .  Alzheimer disease    Past Surgical History  Procedure Laterality Date  . Knee surgery     No family history on file. Social History  Substance Use Topics  . Smoking status: Never Smoker   . Smokeless tobacco: Never Used  . Alcohol Use: Not on file    Review of Systems  Unable to perform ROS: Dementia  Constitutional: Negative for fever, diaphoresis, activity change and fatigue.  HENT: Negative.   Eyes:       False left eye  Respiratory: Negative for cough.   Cardiovascular: Positive for leg swelling. Negative for chest pain.       Chronic left lower extremity swelling since fall in 1961 - per wife  Gastrointestinal: Positive for constipation. Negative for nausea, vomiting, diarrhea, blood in stool, abdominal distention and anal bleeding.  Musculoskeletal: Positive for gait problem.  Skin: Negative.  Negative for pallor.      Allergies  Penicillins  Home Medications   Prior to Admission medications   Medication Sig Start Date End Date Taking? Authorizing Provider  BETOPTIC-S 0.25 % ophthalmic suspension Place 1 drop into the right eye daily.  05/17/14  Yes Historical Provider, MD  carbamazepine (TEGRETOL XR) 400 MG 12 hr tablet Take 400 mg by mouth 2 (two) times daily.  05/09/14  Yes Historical Provider, MD  dorzolamide (TRUSOPT) 2 % ophthalmic solution Place 1 drop into the right eye every 12 (twelve) hours.  05/16/14  Yes Historical Provider, MD  EXELON 4.6  MG/24HR Place 4.6 mg onto the skin daily. Take one off and put the other on 04/11/14  Yes Historical Provider, MD  finasteride (PROSCAR) 5 MG tablet Take 5 mg by mouth daily.  05/03/14  Yes Historical Provider, MD  latanoprost (XALATAN) 0.005 % ophthalmic solution Place 1 drop into the right eye at bedtime.  04/19/14  Yes Historical Provider, MD  SYNTHROID 50 MCG tablet Take 50 mcg by mouth daily before breakfast.  05/14/14  Yes Historical Provider, MD  tamsulosin (FLOMAX) 0.4 MG CAPS capsule Take 0.8 mg by mouth daily after  supper.  05/14/14  Yes Historical Provider, MD   BP 118/66 mmHg  Pulse 67  Temp(Src) 97.4 F (36.3 C) (Oral)  Resp 16  SpO2 100% Physical Exam  Constitutional: He appears well-developed. No distress.  Chronically ill and frail appearing elderly male, pleasantly demented, smiling, NAD  HENT:  Head: Normocephalic and atraumatic.  Mouth/Throat: No oropharyngeal exudate.  Eyes: EOM are normal. Right eye exhibits no discharge. Right conjunctiva is not injected. Right conjunctiva has no hemorrhage. No scleral icterus. Right eye exhibits normal extraocular motion and no nystagmus.  Right pupil round and reactive to light, normal conjunctiva, no scleral icterus, no discharge, normal EOM Left eye - prosthetic  Neck: Normal range of motion. No JVD present. No tracheal deviation present. No thyromegaly present.  Cardiovascular: Normal rate, regular rhythm, normal heart sounds and intact distal pulses.  Exam reveals no gallop and no friction rub.   No murmur heard. LLE 1+ dorsal and pretibial pitting edema, no erythema  Pulmonary/Chest: Effort normal and breath sounds normal. No accessory muscle usage. No respiratory distress. He has no wheezes. He has no rhonchi. He has no rales. He exhibits no tenderness.  Lungs CTA with anterior and lateral auscultation  Abdominal: Soft. Bowel sounds are normal. He exhibits no distension and no mass. There is no tenderness. There is no rebound and no guarding.  Thin, soft, no ttp  Musculoskeletal: Normal range of motion. He exhibits no edema or tenderness.  Lymphadenopathy:    He has no cervical adenopathy.  Neurological: He is alert. He exhibits abnormal muscle tone. He displays no seizure activity. Coordination and gait abnormal.  Oriented to person Paralysis of right arm, flexed at elbow and internally rotated, held against abdomen Neuro eval limited by pt's inability to follow commands or answer questions  Skin: Skin is warm, dry and intact. No rash noted.  He is not diaphoretic. No cyanosis or erythema. No pallor. Nails show no clubbing.  Psychiatric: He has a normal mood and affect. His behavior is normal. Judgment and thought content normal.  Nursing note and vitals reviewed.   ED Course  Procedures (including critical care time) Labs Review Labs Reviewed  BASIC METABOLIC PANEL - Abnormal; Notable for the following:    BUN 23 (*)    Calcium 8.8 (*)    All other components within normal limits  CBC - Abnormal; Notable for the following:    WBC 3.9 (*)    RBC 3.39 (*)    Hemoglobin 10.2 (*)    HCT 30.9 (*)    All other components within normal limits  CBG MONITORING, ED - Abnormal; Notable for the following:    Glucose-Capillary 109 (*)    All other components within normal limits  URINALYSIS, ROUTINE W REFLEX MICROSCOPIC (NOT AT [facility])    Imaging Review Dg Tibia/fibula Left  09/26/2015  CLINICAL DATA:  Fall. Swelling noted to distal left tib/fib. Pt has bilateral knee  and leg pain after falling on oct 13. Pain is getting progressively worse and is affecting his mobility. Pt. has been falling more frequently since. Hx alzheimer's. EXAM: LEFT TIBIA AND FIBULA - 2 VIEW COMPARISON:  None. FINDINGS: No fracture.  No bone lesion. Knee and ankle joints are normally aligned. There are no significant arthropathic changes. Mild subcutaneous soft tissue edema is noted most evident at the ankle. IMPRESSION: 1. No fracture or acute finding. 2. No bone lesion. 3. Mild soft tissue edema. Electronically Signed   By: Lajean Manes M.D.   On: 09/26/2015 16:40   Ct Head Wo Contrast  09/26/2015  CLINICAL DATA:  Unwitnessed fall, Alzheimer's disease EXAM: CT HEAD WITHOUT CONTRAST CT CERVICAL SPINE WITHOUT CONTRAST TECHNIQUE: Multidetector CT imaging of the head and cervical spine was performed following the standard protocol without intravenous contrast. Multiplanar CT image reconstructions of the cervical spine were also generated. COMPARISON:  MRI brain  dated 02/01/2012. FINDINGS: CT HEAD FINDINGS No evidence of parenchymal hemorrhage or extra-axial fluid collection. No mass lesion, mass effect, or midline shift. No CT evidence of acute infarction. Encephalomalacic changes involving the bilateral frontal lobes and left parietal lobe. Overlying postsurgical changes. Extensive subcortical white matter and periventricular small vessel ischemic changes. Global cortical and central atrophy.  Secondary ventriculomegaly. Postsurgical changes involving the frontal and anterior ethmoid sinuses. Bilateral mastoid air cells are clear. No evidence of calvarial fracture. CT CERVICAL SPINE FINDINGS Normal cervical lordosis. No evidence of fracture or dislocation. Vertebral body heights are maintained. Dens appears intact. No prevertebral soft tissue swelling. Moderate multilevel degenerative changes. Visualized thyroid is unremarkable. Visualized lung apices are notable for mild centrilobular and paraseptal emphysematous changes and mild right upper lobe scarring. IMPRESSION: No evidence of acute intracranial abnormality. Encephalomalacic/postsurgical changes involving the bilateral upper lobes and left parietal lobe. Extensive small vessel ischemic changes. Atrophy with secondary ventriculomegaly. No evidence of traumatic injury to the cervical spine. Moderate multilevel degenerative changes. Electronically Signed   By: Julian Hy M.D.   On: 09/26/2015 16:38   Ct Cervical Spine Wo Contrast  09/26/2015  CLINICAL DATA:  Unwitnessed fall, Alzheimer's disease EXAM: CT HEAD WITHOUT CONTRAST CT CERVICAL SPINE WITHOUT CONTRAST TECHNIQUE: Multidetector CT imaging of the head and cervical spine was performed following the standard protocol without intravenous contrast. Multiplanar CT image reconstructions of the cervical spine were also generated. COMPARISON:  MRI brain dated 02/01/2012. FINDINGS: CT HEAD FINDINGS No evidence of parenchymal hemorrhage or extra-axial fluid  collection. No mass lesion, mass effect, or midline shift. No CT evidence of acute infarction. Encephalomalacic changes involving the bilateral frontal lobes and left parietal lobe. Overlying postsurgical changes. Extensive subcortical white matter and periventricular small vessel ischemic changes. Global cortical and central atrophy.  Secondary ventriculomegaly. Postsurgical changes involving the frontal and anterior ethmoid sinuses. Bilateral mastoid air cells are clear. No evidence of calvarial fracture. CT CERVICAL SPINE FINDINGS Normal cervical lordosis. No evidence of fracture or dislocation. Vertebral body heights are maintained. Dens appears intact. No prevertebral soft tissue swelling. Moderate multilevel degenerative changes. Visualized thyroid is unremarkable. Visualized lung apices are notable for mild centrilobular and paraseptal emphysematous changes and mild right upper lobe scarring. IMPRESSION: No evidence of acute intracranial abnormality. Encephalomalacic/postsurgical changes involving the bilateral upper lobes and left parietal lobe. Extensive small vessel ischemic changes. Atrophy with secondary ventriculomegaly. No evidence of traumatic injury to the cervical spine. Moderate multilevel degenerative changes. Electronically Signed   By: Julian Hy M.D.   On: 09/26/2015 16:38   I  have personally reviewed and evaluated these images and lab results as part of my medical decision-making.   EKG Interpretation None      MDM   Final diagnoses:  None    Pt with dementia, right sided paralysis, and increasing falls over the past month. Pt is reportedly not on blood thinners.  Wife is concerned for injury or pain in his legs.  There are no visible signs of trauma.  He is reportedly at his baseline mental status, normal level of activity, and has been eating and drinking normally.    Given unwitnessed falls and age of pt, will obtain CT head and cervical spine. Dr. Winfred Leeds has  seen and evaluated the pt and requested left LE duplex and x-ray of left tib/fib Basic labs were obtained, UA pending, EKG obtained NSR   Basic labs reveal mild anemia, otherwise unremarkable with normal sodium.  X-rays were negative for fracture or acute findings, head CT and cervical spine were also negative for acute pathology, lower extremity duplex was negative for DVT.  Dr. Winfred Leeds has cancelled the in and out cath ad urinalysis and determined that the patient is stable for discharge at this time.    Delsa Grana, PA-C 09/26/15 [place], MD 09/26/15 2358

## 2015-10-20 ENCOUNTER — Encounter (HOSPITAL_COMMUNITY): Payer: Self-pay | Admitting: Emergency Medicine

## 2015-10-20 ENCOUNTER — Inpatient Hospital Stay (HOSPITAL_COMMUNITY)
Admission: EM | Admit: 2015-10-20 | Discharge: 2015-10-24 | DRG: 101 | Disposition: A | Discharge status: Skilled Nursing Facility | Payer: Medicare Other | Attending: Internal Medicine | Admitting: Internal Medicine

## 2015-10-20 DIAGNOSIS — R569 Unspecified convulsions: Secondary | ICD-10-CM | POA: Diagnosis not present

## 2015-10-20 DIAGNOSIS — N400 Enlarged prostate without lower urinary tract symptoms: Secondary | ICD-10-CM | POA: Diagnosis present

## 2015-10-20 DIAGNOSIS — N390 Urinary tract infection, site not specified: Secondary | ICD-10-CM | POA: Diagnosis present

## 2015-10-20 DIAGNOSIS — S098XXA Other specified injuries of head, initial encounter: Secondary | ICD-10-CM | POA: Diagnosis present

## 2015-10-20 DIAGNOSIS — R05 Cough: Secondary | ICD-10-CM | POA: Diagnosis present

## 2015-10-20 DIAGNOSIS — G40409 Other generalized epilepsy and epileptic syndromes, not intractable, without status epilepticus: Secondary | ICD-10-CM | POA: Diagnosis not present

## 2015-10-20 DIAGNOSIS — R1310 Dysphagia, unspecified: Secondary | ICD-10-CM | POA: Diagnosis present

## 2015-10-20 DIAGNOSIS — R059 Cough, unspecified: Secondary | ICD-10-CM

## 2015-10-20 DIAGNOSIS — R5381 Other malaise: Secondary | ICD-10-CM | POA: Diagnosis not present

## 2015-10-20 DIAGNOSIS — Z880 Allergy status to penicillin: Secondary | ICD-10-CM

## 2015-10-20 DIAGNOSIS — Z87440 Personal history of urinary (tract) infections: Secondary | ICD-10-CM

## 2015-10-20 DIAGNOSIS — G40909 Epilepsy, unspecified, not intractable, without status epilepticus: Secondary | ICD-10-CM | POA: Diagnosis not present

## 2015-10-20 DIAGNOSIS — Z9119 Patient's noncompliance with other medical treatment and regimen: Secondary | ICD-10-CM

## 2015-10-20 DIAGNOSIS — W19XXXA Unspecified fall, initial encounter: Secondary | ICD-10-CM | POA: Diagnosis present

## 2015-10-20 DIAGNOSIS — Z66 Do not resuscitate: Secondary | ICD-10-CM | POA: Diagnosis present

## 2015-10-20 DIAGNOSIS — K5909 Other constipation: Secondary | ICD-10-CM | POA: Diagnosis present

## 2015-10-20 DIAGNOSIS — G8191 Hemiplegia, unspecified affecting right dominant side: Secondary | ICD-10-CM | POA: Diagnosis present

## 2015-10-20 DIAGNOSIS — R296 Repeated falls: Secondary | ICD-10-CM | POA: Diagnosis present

## 2015-10-20 DIAGNOSIS — M25561 Pain in right knee: Secondary | ICD-10-CM | POA: Diagnosis present

## 2015-10-20 DIAGNOSIS — F0280 Dementia in other diseases classified elsewhere without behavioral disturbance: Secondary | ICD-10-CM | POA: Diagnosis present

## 2015-10-20 DIAGNOSIS — G309 Alzheimer's disease, unspecified: Secondary | ICD-10-CM | POA: Diagnosis present

## 2015-10-20 DIAGNOSIS — R561 Post traumatic seizures: Secondary | ICD-10-CM | POA: Diagnosis present

## 2015-10-20 DIAGNOSIS — Y92009 Unspecified place in unspecified non-institutional (private) residence as the place of occurrence of the external cause: Secondary | ICD-10-CM

## 2015-10-20 DIAGNOSIS — E039 Hypothyroidism, unspecified: Secondary | ICD-10-CM | POA: Diagnosis present

## 2015-10-20 DIAGNOSIS — E876 Hypokalemia: Secondary | ICD-10-CM | POA: Diagnosis present

## 2015-10-20 DIAGNOSIS — D61818 Other pancytopenia: Secondary | ICD-10-CM | POA: Diagnosis present

## 2015-10-20 DIAGNOSIS — Z79899 Other long term (current) drug therapy: Secondary | ICD-10-CM

## 2015-10-20 DIAGNOSIS — M25562 Pain in left knee: Secondary | ICD-10-CM | POA: Diagnosis present

## 2015-10-20 HISTORY — DX: Unspecified convulsions: R56.9

## 2015-10-20 LAB — URINALYSIS, ROUTINE W REFLEX MICROSCOPIC
BILIRUBIN URINE: NEGATIVE
Glucose, UA: NEGATIVE mg/dL
Ketones, ur: NEGATIVE mg/dL
Nitrite: NEGATIVE
Protein, ur: 30 mg/dL — AB
SPECIFIC GRAVITY, URINE: 1.019 (ref 1.005–1.030)
pH: 6.5 (ref 5.0–8.0)

## 2015-10-20 LAB — BASIC METABOLIC PANEL
Anion gap: 10 (ref 5–15)
BUN: 26 mg/dL — AB (ref 6–20)
CHLORIDE: 109 mmol/L (ref 101–111)
CO2: 22 mmol/L (ref 22–32)
CREATININE: 1.14 mg/dL (ref 0.61–1.24)
Calcium: 8.7 mg/dL — ABNORMAL LOW (ref 8.9–10.3)
GFR calc Af Amer: >60 mL/min (ref >60)
GFR calc non Af Amer: 57 mL/min — ABNORMAL LOW (ref >60)
Glucose, Bld: 83 mg/dL (ref 65–99)
Potassium: 4.1 mmol/L (ref 3.5–5.1)
SODIUM: 141 mmol/L (ref 135–145)

## 2015-10-20 LAB — TSH: TSH: 12.427 u[IU]/mL — AB (ref 0.350–4.500)

## 2015-10-20 LAB — URINE MICROSCOPIC-ADD ON

## 2015-10-20 LAB — CARBAMAZEPINE LEVEL, TOTAL

## 2015-10-20 LAB — CBG MONITORING, ED: Glucose-Capillary: 84 mg/dL (ref 65–99)

## 2015-10-20 MED ORDER — CARBAMAZEPINE 200 MG PO TABS
400.0000 mg | ORAL_TABLET | Freq: Once | ORAL | Status: DC
  Filled 2015-10-20: qty 2

## 2015-10-20 MED ORDER — CARBAMAZEPINE 200 MG PO TABS
400.0000 mg | ORAL_TABLET | Freq: Once | ORAL | Status: AC
  Administered 2015-10-20: 400 mg via ORAL

## 2015-10-20 MED ORDER — CARBAMAZEPINE ER 400 MG PO TB12: 400.0000 mg | ORAL_TABLET | Freq: Two times a day (BID) | ORAL | Status: DC

## 2015-10-20 MED ORDER — BETAXOLOL HCL 0.25 % OP SUSP
1.0000 [drp] | Freq: Every day | OPHTHALMIC | Status: DC
  Administered 2015-10-20 – 2015-10-24 (×5): 1 [drp] via OPHTHALMIC
  Filled 2015-10-20: qty 10

## 2015-10-20 MED ORDER — DEXTROSE 5 % IV SOLN
1.0000 g | Freq: Once | INTRAVENOUS | Status: AC
  Administered 2015-10-20: 1 g via INTRAVENOUS
  Filled 2015-10-20: qty 10

## 2015-10-20 MED ORDER — MIRABEGRON ER 50 MG PO TB24
50.0000 mg | ORAL_TABLET | Freq: Every evening | ORAL | Status: DC
  Administered 2015-10-20 – 2015-10-23 (×4): 50 mg via ORAL
  Filled 2015-10-20 (×5): qty 1

## 2015-10-20 MED ORDER — CARBAMAZEPINE 200 MG PO TABS
400.0000 mg | ORAL_TABLET | Freq: Two times a day (BID) | ORAL | Status: DC
  Administered 2015-10-20 – 2015-10-24 (×9): 400 mg via ORAL
  Filled 2015-10-20 (×9): qty 2

## 2015-10-20 MED ORDER — LATANOPROST 0.005 % OP SOLN
1.0000 [drp] | Freq: Every day | OPHTHALMIC | Status: DC
  Administered 2015-10-20 – 2015-10-23 (×4): 1 [drp] via OPHTHALMIC
  Filled 2015-10-20: qty 2.5

## 2015-10-20 MED ORDER — LORAZEPAM 2 MG/ML IJ SOLN
1.0000 mg | Freq: Once | INTRAMUSCULAR | Status: DC
  Filled 2015-10-20: qty 1

## 2015-10-20 MED ORDER — LEVOTHYROXINE SODIUM 25 MCG PO TABS: 25.0000 ug | ORAL_TABLET | Freq: Every day | ORAL | Status: DC

## 2015-10-20 MED ORDER — DORZOLAMIDE HCL 2 % OP SOLN
1.0000 [drp] | Freq: Two times a day (BID) | OPHTHALMIC | Status: DC
  Administered 2015-10-20 – 2015-10-24 (×9): 1 [drp] via OPHTHALMIC
  Filled 2015-10-20: qty 10

## 2015-10-20 MED ORDER — SODIUM CHLORIDE 0.9 % IV SOLN
75.0000 mL/h | INTRAVENOUS | Status: DC
  Administered 2015-10-20 – 2015-10-21 (×2): 75 mL/h via INTRAVENOUS

## 2015-10-20 MED ORDER — TAMSULOSIN HCL 0.4 MG PO CAPS
0.4000 mg | ORAL_CAPSULE | Freq: Every day | ORAL | Status: DC
  Administered 2015-10-20 – 2015-10-24 (×5): 0.4 mg via ORAL
  Filled 2015-10-20 (×5): qty 1

## 2015-10-20 MED ORDER — SULFAMETHOXAZOLE-TRIMETHOPRIM 800-160 MG PO TABS: 1.0000 | ORAL_TABLET | Freq: Two times a day (BID) | ORAL | Status: DC

## 2015-10-20 MED ORDER — POLYETHYLENE GLYCOL 3350 17 G PO PACK: 17.0000 g | PACK | Freq: Every day | ORAL | Status: DC | PRN

## 2015-10-20 MED ORDER — ENOXAPARIN SODIUM 40 MG/0.4ML ~~LOC~~ SOLN
40.0000 mg | SUBCUTANEOUS | Status: DC
  Administered 2015-10-20 – 2015-10-24 (×5): 40 mg via SUBCUTANEOUS
  Filled 2015-10-20 (×5): qty 0.4

## 2015-10-20 MED ORDER — DEXTROSE 5 % IV SOLN
1.0000 g | INTRAVENOUS | Status: DC
  Administered 2015-10-21 – 2015-10-24 (×4): 1 g via INTRAVENOUS
  Filled 2015-10-20 (×6): qty 10

## 2015-10-20 MED ORDER — SULFAMETHOXAZOLE-TRIMETHOPRIM 800-160 MG PO TABS: 1.0000 | ORAL_TABLET | Freq: Once | ORAL | Status: DC

## 2015-10-20 MED ORDER — FINASTERIDE 5 MG PO TABS
5.0000 mg | ORAL_TABLET | Freq: Every day | ORAL | Status: DC
  Administered 2015-10-20 – 2015-10-24 (×5): 5 mg via ORAL
  Filled 2015-10-20 (×5): qty 1

## 2015-10-20 MED ORDER — RIVASTIGMINE 4.6 MG/24HR TD PT24
4.6000 mg | MEDICATED_PATCH | TRANSDERMAL | Status: DC
  Administered 2015-10-20 – 2015-10-23 (×4): 4.6 mg via TRANSDERMAL
  Filled 2015-10-20 (×7): qty 1

## 2015-10-20 NOTE — Evaluation (Signed)
Clinical/Bedside Swallow Evaluation Patient Details  Name: Erik Massey MRN: [id number] Date of Birth: 06-Jun-1929  Today's Date: 10/20/2015 Time: SLP Start Time (ACUTE ONLY): 1107 SLP Stop Time (ACUTE ONLY): 1127 SLP Time Calculation (min) (ACUTE ONLY): 20 min  Past Medical History:  Past Medical History  Diagnosis Date  . Alzheimer disease   . Seizures ([place])    Past Surgical History:  Past Surgical History  Procedure Laterality Date  . Knee surgery     HPI:  Erik Massey is a 81 y.o. male with a history of seizures. Patient has not had a seizure in 16 years, maintained on Tegretol. Patient had a seizure at home this a.m.. In the emergency department Tegretol level was less than 2. After stabilization plan was to discharge patient home but then he had a second seizure with tongue trauma. Per wife, patient ran out of Tegretol. Wife called for refill but it did not get approved in time and patient missed some doses.  Patient's wife provides some details about medical history : Patient has right upper extremity weakness secondary to a fall from a 3 story building in 1961.  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  His wife reported that he is implusive with his intake, always eating very quickly.  She reported no recent respiratory issues.     Assessment / Plan / Recommendation Clinical Impression  Clinical swallowing evaluation was completed.  The patient's wife reported that the patient is very impulsive while eating and eats very quickly.  She did not report any recent respiratory issues.  Clinically the patient presented with mild oral dysphagia characterized by delayed oral transit. It appeared at least partially due to lack of dentition.  The patient had dentures but has lost them.   Swallow trigger was timely and hyo-laryngeal excursion appeared to  be adequate.  The patient ate/drank as quickly as he possibly could and despite cues was unable to slow down.  Overt s/s of aspiration were not observed, however the patient is a risk due to impulsivity.  Recommend continue with a regular diet and thin liquids.  The patient should be fully supervised with all intake in attempt to control rate of intake.  ST to follow up for therapeutic diet tolerance.      Aspiration Risk  Mild aspiration risk    Diet Recommendation   Regular with thin liquids.    Medication Administration: Crushed with puree    Other  Recommendations Oral Care Recommendations: Oral care BID   Follow up Recommendations   (TBD)    Frequency and Duration min 2x/week  2 weeks       Prognosis Barriers to Reach Goals: Cognitive deficits      Swallow Study   General Date of Onset: 10/20/15 HPI: Erik Massey is a 81 y.o. male with a history of seizures. Patient has not had a seizure in 16 years, maintained on Tegretol. Patient had a seizure at home this a.m.. In the emergency department Tegretol level was less than 2. After stabilization plan was to discharge patient home but then he had a second seizure with tongue trauma. Per wife, patient ran out of Tegretol. Wife called for refill but it did not get approved in time and patient missed some doses.  Patient's wife provides some details about medical history : Patient  has right upper extremity weakness secondary to a fall from a 3 story building in 1961.  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  His wife reported that he is implusive with his intake, always eating very quickly.  She reported no recent respiratory issues.   Type of Study: Bedside Swallow Evaluation Previous Swallow Assessment: None noted.   Diet Prior to this Study: Regular;Thin liquids Temperature Spikes Noted:  No Respiratory Status: Room air History of Recent Intubation: No Behavior/Cognition: Alert;Impulsive;Requires cueing Oral Cavity Assessment: Erythema (bruising on tongue was observed.) Oral Care Completed by SLP: No Oral Cavity - Dentition: Missing dentition Vision: Functional for self-feeding Self-Feeding Abilities: Able to feed self Patient Positioning: Upright in bed Baseline Vocal Quality: Normal Volitional Cough: Strong Volitional Swallow: Able to elicit    Oral/Motor/Sensory Function Overall Oral Motor/Sensory Function: Mild impairment Facial ROM: Within Functional Limits Facial Symmetry: Within Functional Limits Facial Strength: Within Functional Limits Lingual ROM: Reduced right Lingual Symmetry: Abnormal symmetry right Lingual Strength: Reduced Mandible: Within Functional Limits   Ice Chips Ice chips: Not tested   Thin Liquid Thin Liquid: Within functional limits Presentation: Cup;Self Fed;Spoon;Straw    Nectar Thick Nectar Thick Liquid: Not tested   Honey Thick Honey Thick Liquid: Not tested   Puree Puree: Impaired Presentation: Spoon;Self Fed Oral Phase Impairments: Impaired mastication Oral Phase Functional Implications: Prolonged oral transit   Solid Solid: Impaired Presentation: Self Fed Oral Phase Impairments: Impaired mastication Oral Phase Functional Implications: Prolonged oral transit      Shelly Flatten, MA, CCC-SLP Acute Rehab SLP [id number] Shelly Flatten N 10/20/2015,11:38 AM

## 2015-10-20 NOTE — Progress Notes (Signed)
Patient arrived from ED to room 5M11 accompanied by wife. Safety precautions and orders reviewed with patient. TELE applied and confirmed with Glenard Haring (CCMD). Patient denied any discomfort. No other distress noted. Will continue to monitor.   Ave Filter, RN

## 2015-10-20 NOTE — Discharge Instructions (Signed)

## 2015-10-20 NOTE — ED Notes (Signed)
Family at bedside. 

## 2015-10-20 NOTE — ED Provider Notes (Addendum)
CSN: [id number]     Arrival date & time 10/20/15  0250 History  By signing my name below, I, Altamease Oiler, attest that this documentation has been prepared under the direction and in the presence of Orpah Greek, MD. Electronically Signed: Altamease Oiler, ED Scribe. 10/20/2015. 3:26 AM     Chief Complaint  Patient presents with  . Seizures   Level V caveat secondary to dementia.  The history is provided by the EMS personnel.   Erik Massey is a 81 y.o. male with history of alzheimer's disease and epilepsy who presents to the Emergency Department complaining of a non-focal seizure witnessed by his wife in bed tonight. There was no fall or head injury.  He was post-ictal on EMS arrival and is reportedly near baseline at present. His last seizure was in 2000.   Past Medical History  Diagnosis Date  . Alzheimer disease    Past Surgical History  Procedure Laterality Date  . Knee surgery     No family history on file. Social History  Substance Use Topics  . Smoking status: Never Smoker   . Smokeless tobacco: Never Used  . Alcohol Use: Not on file    Review of Systems  Unable to perform ROS: Dementia      Allergies  Penicillins  Home Medications   Prior to Admission medications   Medication Sig Start Date End Date Taking? Authorizing Provider  BETOPTIC-S 0.25 % ophthalmic suspension Place 1 drop into the right eye daily.  05/17/14  Yes Historical Provider, MD  carbamazepine (TEGRETOL XR) 400 MG 12 hr tablet Take 400 mg by mouth 2 (two) times daily.  05/09/14  Yes Historical Provider, MD  cholecalciferol (VITAMIN D) 1000 UNITS tablet Take 2,000 Units by mouth daily.   Yes Historical Provider, MD  dorzolamide (TRUSOPT) 2 % ophthalmic solution Place 1 drop into the right eye every 12 (twelve) hours.  05/16/14  Yes Historical Provider, MD  EXELON 4.6 MG/24HR Place 4.6 mg onto the skin daily. Take one off and put the other on 04/11/14  Yes Historical Provider, MD   finasteride (PROSCAR) 5 MG tablet Take 5 mg by mouth daily.  05/03/14  Yes Historical Provider, MD  latanoprost (XALATAN) 0.005 % ophthalmic solution Place 1 drop into the right eye at bedtime.  04/19/14  Yes Historical Provider, MD  mirabegron ER (MYRBETRIQ) 50 MG TB24 tablet Take 50 mg by mouth every evening.   Yes Historical Provider, MD  OVER THE COUNTER MEDICATION Take 1 capsule by mouth 2 (two) times daily. Prostate plus   Yes Historical Provider, MD  senna-docusate (SENOKOT-S) 8.6-50 MG tablet Take 1 tablet by mouth 2 (two) times daily.   Yes Historical Provider, MD  SYNTHROID 50 MCG tablet Take 50 mcg by mouth daily before breakfast.  05/14/14  Yes Historical Provider, MD  tamsulosin (FLOMAX) 0.4 MG CAPS capsule Take 0.8 mg by mouth daily after supper.  05/14/14  Yes Historical Provider, MD   BP 124/68 mmHg  Pulse 72  Temp(Src) 98.5 F (36.9 C)  Resp 24  SpO2 99% Physical Exam  Constitutional: He appears well-developed and well-nourished. No distress.  HENT:  Head: Normocephalic and atraumatic.  Right Ear: Hearing normal.  Left Ear: Hearing normal.  Nose: Nose normal.  Mouth/Throat: Oropharynx is clear and moist and mucous membranes are normal.  Eyes: Conjunctivae and EOM are normal. Pupils are equal, round, and reactive to light.  Neck: Normal range of motion. Neck supple.  Cardiovascular: Regular rhythm, S1  normal and S2 normal.  Exam reveals no gallop and no friction rub.   No murmur heard. Pulmonary/Chest: Effort normal and breath sounds normal. No respiratory distress. He exhibits no tenderness.  Abdominal: Soft. Normal appearance and bowel sounds are normal. There is no hepatosplenomegaly. There is no tenderness. There is no rebound, no guarding, no tenderness at McBurney's point and negative Murphy's sign. No hernia.  Musculoskeletal: Normal range of motion.  Neurological: He is alert. He has normal strength. No cranial nerve deficit or sensory deficit. Coordination normal.  GCS eye subscore is 4. GCS verbal subscore is 5. GCS motor subscore is 6.  Pt is awake, alert, disoriented, and confused.   Skin: Skin is warm, dry and intact. No rash noted. No cyanosis.  Psychiatric: He has a normal mood and affect. His speech is normal and behavior is normal. Thought content normal.  Nursing note and vitals reviewed.   ED Course  Procedures (including critical care time)  DIAGNOSTIC STUDIES: Oxygen Saturation is 100% on RA,  normal by my interpretation.    COORDINATION OF CARE: 3:10 AM Treatment plan includes lab work and EKG.  Labs Review Labs Reviewed  CARBAMAZEPINE LEVEL, TOTAL - Abnormal; Notable for the following:    Carbamazepine Lvl <2.0 (*)    All other components within normal limits  BASIC METABOLIC PANEL - Abnormal; Notable for the following:    BUN 26 (*)    Calcium 8.7 (*)    GFR calc non Af Amer 57 (*)    All other components within normal limits  URINALYSIS, ROUTINE W REFLEX MICROSCOPIC (NOT AT [facility]) - Abnormal; Notable for the following:    APPearance TURBID (*)    Hgb urine dipstick MODERATE (*)    Protein, ur 30 (*)    Leukocytes, UA MODERATE (*)    All other components within normal limits  URINE MICROSCOPIC-ADD ON - Abnormal; Notable for the following:    Squamous Epithelial / LPF 0-5 (*)    Bacteria, UA MANY (*)    All other components within normal limits  CBG MONITORING, ED    Imaging Review No results found. I have personally reviewed and evaluated these  lab results as part of my medical decision-making.   EKG Interpretation   Date/Time:  Monday October 20 2015 03:00:20 EST Ventricular Rate:  75 PR Interval:  175 QRS Duration: 86 QT Interval:  384 QTC Calculation: 429 R Axis:   78 Text Interpretation:  Sinus rhythm Normal ECG Confirmed by Neta Upadhyay  MD,  Amillion Macchia [id number]) on 10/20/2015 4:16:59 AM      MDM   Final diagnoses:  Seizure ([place])  UTI (lower urinary tract infection)    Patient presented to the ER  for evaluation of single seizure. He does have a history of seizures, but has not had one since 2000. He normally takes Tegretol for seizures. His wife administers his medications. She reports that he has run out of the Tegretol and the pharmacy was unable to refill it. The pharmacy was supposed to get in touch with her doctor to get a refill, but this has not happened. He has missed at least 3 doses. Tegretol level was undetectable here, likely a cause of the seizure. Blood work was unremarkable. Urinalysis suggest infection. Urinary tract infection coupled with noncompliance with his Tegretol likely related to this breakthrough seizure.   Patient was administered Tegretol and oral Bactrim, anticipating outpatient management. When arranging for discharge, however, patient had a second seizure. He did bite his tongue,  has abrasion/laceration on the underside of his tongue. No repair is necessary. Based on the fact that he has had 2 seizures, will admit the patient. Discussed briefly with Dr. Armida Sans, on call for neurology. Recommend second dose of Tegretol. Has been given 1 mg of Ativan IV as well. Rocephin IV for urinary tract infection. Admit to medicine.  I personally performed the services described in this documentation, which was scribed in my presence. The recorded information has been reviewed and is accurate.    Orpah Greek, MD 10/20/15 [id number]  Orpah Greek, MD 10/20/15 [place], MD 10/20/15 [phone]

## 2015-10-20 NOTE — ED Notes (Signed)
Patient family states patient has hard time swallowing PO medicatio.

## 2015-10-20 NOTE — ED Notes (Signed)
Attempted to obtain IV access again, unsuccessful.

## 2015-10-20 NOTE — Progress Notes (Signed)
TSH 12. Off synthroid for a month. Will resume at 1/2 of home synthroid dose, will need to titrate upwards.

## 2015-10-20 NOTE — ED Notes (Signed)
attempted report advised to call [facility] in about 5 min in patients room.

## 2015-10-20 NOTE — ED Notes (Signed)
Pt arrives from home post seizure, hasn't had a seizure since 2000. Takes tegretol. Hx dementia and epilepsy. Post ictal on EMS arrival, alert to baseline now.

## 2015-10-20 NOTE — ED Notes (Signed)
Pt had 30 second witnessed seizure, full body convulsing. Oral trama present. MD notified. Pt remains post ictal at this time.

## 2015-10-20 NOTE — Consult Note (Signed)
NEURO HOSPITALIST CONSULT NOTE   Referring physician: Dr Betsey Holiday Reason for Consult: seizures  HPI:                                                                                                                                          Erik Massey is an 81 y.o. 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 Wife is at the bedside and tells me that he run out his AED 2 or 3 days ago and couldn't be refilled. She reports " at least no big seizures since year 2000". Patient returned to his baseline, had undetectable carbamazepine level today and was given additional dose 400 mg and IV ativan and was about to go home when unfourtunately sustained another seizure. Then, advised to give him additional 400 mg tegretol (total 800 mg extra tegretol today) Found to have UTI. Wife indicated that he is " pretty much at his baseline" right now. At baseline walks with a cane. He open eyes and follows simple commands.   Past Medical History  Diagnosis Date  . Alzheimer disease   . Seizures [facility])     Past Surgical History  Procedure Laterality Date  . Knee surgery      No family history on file.  Family History: no epilepsy, MS, or brain tumor   Social History:  reports that he has never smoked. He has never used smokeless tobacco. He reports that he does not drink alcohol. His drug history is not on file.  Allergies  Allergen Reactions  . Penicillins Swelling    Has patient had a PCN reaction causing immediate rash, facial/tongue/throat swelling, SOB or lightheadedness with hypotension: yes Has patient had a PCN reaction causing severe rash involving mucus membranes or skin necrosis: no Has patient had a PCN reaction that required  hospitalization- yes already in the hospital Has patient had a PCN reaction occurring within the last 10 years: No If all of the above answers are "NO", then may proceed with Cephalosporin use.     MEDICATIONS:  I have reviewed the patient's current medications.   ROS: unable to obtain due to mental status                                                                                                                                      History obtained from wife and chart review   Physical exam:  Constitutional: well developed, pleasant male in no apparent distress. Blood pressure 169/82, pulse 70, temperature 97.9 F (36.6 C), temperature source Oral, resp. rate 20, SpO2 100 %. Eyes: no jaundice or exophthalmos. Blind left eye  Head: normocephalic. Neck: supple, no bruits, no JVD. Cardiac: no murmurs. Lungs: clear. Abdomen: soft, no tender, no mass. Extremities: no edema, clubbing, or cyanosis.  Skin: no rash  Neurologic Examination:                                                                                                      General: NAD Mental Status: Alert and awake, knows his name but disoriented to place. Follows simple commands. Cranial Nerves: II: Discs flat bilaterally; Visual fields grossly normal, pupils equal, round, reactive to light and accommodation III,IV, VI: ptosis not present, extra-ocular motions intact bilaterally V,VII: smile symmetric, facial light touch sensation normal bilaterally VIII: hearing normal bilaterally IX,X: uvula rises symmetrically XI: bilateral shoulder shrug XII: midline tongue extension without atrophy or fasciculations Motor: Right hemiparesis Sensory: withdraws to noxious stimuli Deep Tendon Reflexes:  1 all over Plantars: Right: upgoing   Left: downgoing Cerebellar: Unable to test due to mental  status Gait:  No tested due to multiple leads and mental status    No results found for: CHOL  Results for orders placed or performed during the hospital encounter of 10/20/15 (from the past 48 hour(s))  CBG monitoring, ED     Status: None   Collection Time: 10/20/15  2:59 AM  Result Value Ref Range   Glucose-Capillary 84 65 - 99 mg/dL  Carbamazepine (Tegretol) Level  (if patient is taking this medication)     Status: Abnormal   Collection Time: 10/20/15  4:09 AM  Result Value Ref Range   Carbamazepine Lvl <2.0 (L) 4.0 - 12.0 ug/mL  Basic metabolic panel     Status: Abnormal   Collection Time: 10/20/15  4:09 AM  Result Value Ref Range   Sodium 141 135 - 145 mmol/L   Potassium 4.1 3.5 - 5.1 mmol/L   Chloride 109 101 - 111 mmol/L   CO2 22 22 - 32 mmol/L  Glucose, Bld 83 65 - 99 mg/dL   BUN 26 (H) 6 - 20 mg/dL   Creatinine, Ser 1.14 0.61 - 1.24 mg/dL   Calcium 8.7 (L) 8.9 - 10.3 mg/dL   GFR calc non Af Amer 57 (L) >60 mL/min   GFR calc Af Amer >60 >60 mL/min    Comment: (NOTE) The eGFR has been calculated using the CKD EPI equation. This calculation has not been validated in all clinical situations. eGFR's persistently <60 mL/min signify possible Chronic Kidney Disease.    Anion gap 10 5 - 15  Urinalysis, Routine w reflex microscopic (not at [facility])     Status: Abnormal   Collection Time: 10/20/15  5:29 AM  Result Value Ref Range   Color, Urine YELLOW YELLOW   APPearance TURBID (A) CLEAR   Specific Gravity, Urine 1.019 1.005 - 1.030   pH 6.5 5.0 - 8.0   Glucose, UA NEGATIVE NEGATIVE mg/dL   Hgb urine dipstick MODERATE (A) NEGATIVE   Bilirubin Urine NEGATIVE NEGATIVE   Ketones, ur NEGATIVE NEGATIVE mg/dL   Protein, ur 30 (A) NEGATIVE mg/dL   Nitrite NEGATIVE NEGATIVE   Leukocytes, UA MODERATE (A) NEGATIVE  Urine microscopic-add on     Status: Abnormal   Collection Time: 10/20/15  5:29 AM  Result Value Ref Range   Squamous Epithelial / LPF 0-5 (A) NONE SEEN   WBC, UA  6-30 0 - 5 WBC/hpf   RBC / HPF 6-30 0 - 5 RBC/hpf   Bacteria, UA MANY (A) NONE SEEN    No results found.   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 As per wife, he is back to baseline. Received extra dose 800 mg tegretol. Continue regular outpatient dose tegretol. Check tegretol level in am. May necessitate loading dose IV keppra if further seizures. Will follow up.  Dorian Pod, MD 10/20/2015, 7:40 AM

## 2015-10-20 NOTE — Evaluation (Signed)
Occupational Therapy Evaluation Patient Details Name: Erik Massey MRN: [id number] DOB: July 05, 1929 Today's Date: 10/20/2015    History of Present Illness Erik Massey is a 81 y.o. male with a history of seizures. Patient has not had a seizure in 16 years. Patient had a seizure at home. After stabilization plan was to discharge patient home but then he had a second seizure with tongue trauma. 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 His wife reported that he is implusive with his intake, always eating very quickly. She reported no recent respiratory issues.    Clinical Impression   Patient presenting with decreased ADL and functional mobility independence. Patient mod assist with ADLs and mod I with functional mobility using hemi-walker PTA. Patient currently functioning at an overall total assist level for ADLs and mod assist level for sit to/from stands and stand pivot transfer with 1 person hand held assist. Patient will benefit from acute OT to increase overall independence in the areas of ADLs, functional mobility, and overall safety in order to safely discharge to venue listed below.   Wife present for some of evaluation, but left in middle of session. Wife states that she has been helping him since accident in 40. Pt not verbally communicating with therapist, patient's wife states this is new. Pt requiring extra time to follow one step commands.    Wife required assistance with transportation off unit and to car.    Follow Up Recommendations  SNF;Supervision/Assistance - 24 hour    Equipment Recommendations  Other (comment) (TBD)    Recommendations for Other Services  None at this time   Precautions / Restrictions Precautions Precautions:  Fall Restrictions Weight Bearing Restrictions: No     Mobility Bed Mobility Overal bed mobility: Needs Assistance Bed Mobility: Supine to Sit     Supine to sit: Mod assist     General bed mobility comments: Assistance for management of BLEs. Cues for initiation, sequencing, and safety  Transfers Overall transfer level: Needs assistance Equipment used: None;1 person hand held assist Transfers: Sit to/from [employer] Transfers Sit to Stand: Mod assist Stand pivot transfers: Mod assist       General transfer comment: Multimodal cueing for initiation, sequencing, safety.     Balance Overall balance assessment: Needs assistance Sitting-balance support: No upper extremity supported;Feet supported Sitting balance-Leahy Scale: Poor     Standing balance support: No upper extremity supported;During functional activity Standing balance-Leahy Scale: Poor    ADL Overall ADL's : Needs assistance/impaired General ADL Comments: Pt overall total assist for ADLs at this time for physical assistance and due to poor cognition. Unsure of patient's PLOF regarding cognition PTA. According to wife, pt was overall mod assist for bathing & dressing.     Vision Additional Comments: Unsure due to poor cognition. Will continue to further assess.           Pertinent Vitals/Pain Pain Assessment: Faces Faces Pain Scale: No hurt     Hand Dominance Right   Extremity/Trunk Assessment Upper Extremity Assessment Upper Extremity Assessment: RUE deficits/detail;Generalized weakness RUE Deficits / Details: contracture/weakness in RUE - premorbid from fall in 1961 resulting in TBI   Lower Extremity Assessment Lower Extremity Assessment: Defer to PT evaluation   Cervical / Trunk Assessment  Cervical / Trunk Assessment: Kyphotic   Communication Communication Communication: Receptive difficulties;Expressive difficulties   Cognition Arousal/Alertness: Lethargic Behavior During Therapy:  Flat affect;Restless Overall Cognitive Status: No family/caregiver present to determine baseline cognitive functioning (pt with TBI in 1961)              Home Living Family/patient expects to be discharged to:: Private residence Living Arrangements: Spouse/significant other Available Help at Discharge: Family;Available 24 hours/day Type of Home: House Home Access: Stairs to enter [employer] of Steps: 3   Home Layout: Two level;1/2 bath on main level Alternate Level Stairs-Number of Steps: 15   Bathroom Shower/Tub: Tub/shower unit;Curtain   Bathroom Toilet: Handicapped height     Home Equipment: Tub bench;Cane - quad   Prior Functioning/Environment Level of Independence: Needs assistance  Gait / Transfers Assistance Needed: mod I using cane ADL's / Homemaking Assistance Needed: mod assist for bathing and dressing, mod I with cane ambulating to/from bathroom Communication / Swallowing Assistance Needed: wife reports pt is independent       OT Diagnosis: Generalized weakness   OT Problem List: Decreased strength;Decreased range of motion;Decreased activity tolerance;Impaired balance (sitting and/or standing);Impaired vision/perception;Decreased safety awareness;Decreased knowledge of use of DME or AE;Decreased cognition;Decreased knowledge of precautions   OT Treatment/Interventions: Self-care/ADL training;Therapeutic exercise;Energy conservation;DME and/or AE instruction;Therapeutic activities;Patient/family education;Balance training;Cognitive remediation/compensation    OT Goals(Current goals can be found in the care plan section) Acute Rehab OT Goals Patient Stated Goal: none stated OT Goal Formulation: Patient unable to participate in goal setting Time For Goal Achievement: 11/03/15 Potential to Achieve Goals: Fair ADL Goals Pt Will Perform Grooming: with supervision;standing Pt Will Transfer to Toilet: with supervision;bedside commode;ambulating Pt Will  Perform Tub/Shower Transfer: Tub transfer;tub bench;ambulating;with supervision Additional ADL Goal #1: Pt will be supervision for functional ambulation using hemi walker  OT Frequency: Min 2X/week   Barriers to D/C: None known at this time    End of Session Equipment Utilized During Treatment: Gait belt Nurse Communication: Mobility status;Other (comment) (pt up in recliner with chair alarm)  Activity Tolerance: Patient tolerated treatment well Patient left: in chair;with call bell/phone within reach;with chair alarm set   Time: [id number] OT Time Calculation (min): 29 min Charges:  OT General Charges $OT Visit: 1 Procedure OT Evaluation $Initial OT Evaluation Tier I: 1 Procedure OT Treatments $Self Care/Home Management : 8-22 mins G-Codes: OT G-codes **NOT FOR INPATIENT CLASS** Functional Limitation: Self care Self Care Current Status [id number]): 100 percent impaired, limited or restricted Self Care Goal Status [id number]): At least 1 percent but less than 20 percent impaired, limited or restricted  Butler Vegh , MS, OTR/L, CLT Pager: [phone]  10/20/2015, 2:11 PM

## 2015-10-20 NOTE — H&P (Signed)
[facility] History and Physical  Erik Massey [id number] DOB: [birth year] DOA: 10/20/2015  Referring physician: Emergency Department PCP: Erik Greenland, MD   CHIEF COMPLAINT: seizures  HPI: Erik Massey is a 81 y.o. male with a history of seizures. Patient has not had a seizure in 16 years,  maintained on Tegretol. Patient had a seizure at home this a.m.. In the emergency department Tegretol level was less than 2. After stabilization plan was to discharge patient home but then he had a second seizure with tongue trauma. Per wife, patient ran out of Tegretol. Wife called for refill but it did not get approved in time and patient missed some doses.   Patient's wife provides some details about medical history : Patient has right upper extremity weakness secondary to a fall from a 3 story building many years ago.  Patient has been losing his balance and falling at home recently.  He has chronic constipation with hard, large stools. He cough if eats or drinks to rapidly.  ED COURSE:    dose of Ativan Dose of tegetrol To give 2nd dose of tegetrol after seizure in ED  Labs:   tegretol level < 2 Glucose 83 WBC 3.9 hgb 10.2  Urinalysis:    Turbid, many bacteria, moderate leukocytes, neg nitrites  EKG:    Normal ECG  Medications  carbamazepine (TEGRETOL) tablet 400 mg (not administered)  sulfamethoxazole-trimethoprim (BACTRIM DS,SEPTRA DS) 800-160 MG per tablet 1 tablet (not administered)  LORazepam (ATIVAN) injection 1 mg (not administered)  carbamazepine (TEGRETOL) tablet 400 mg (not administered)  cefTRIAXone (ROCEPHIN) 1 g in dextrose 5 % 50 mL IVPB (not administered)    Review of Systems  Unable to perform ROS: medical condition    Past Medical History  Diagnosis Date  . Alzheimer disease   . Seizures [facility])    Past Surgical History  Procedure Laterality Date  . Knee surgery      SOCIAL HISTORY:  reports that he has never smoked. He has never  used smokeless tobacco. He reports that he does not drink alcohol. His drug history is not on file. Lives:    At home with wife  Assistive devices:   Cane needed for ambulation.   Allergies  Allergen Reactions  . Penicillins Swelling    Has patient had a PCN reaction causing immediate rash, facial/tongue/throat swelling, SOB or lightheadedness with hypotension: yes Has patient had a PCN reaction causing severe rash involving mucus membranes or skin necrosis: no Has patient had a PCN reaction that required hospitalization- yes already in the hospital Has patient had a PCN reaction occurring within the last 10 years: No If all of the above answers are "NO", then may proceed with Cephalosporin use.     [place]: unobtainable. Post-ictal state  Prior to Admission medications   Medication Sig Start Date End Date Taking? Authorizing Provider  BETOPTIC-S 0.25 % ophthalmic suspension Place 1 drop into the right eye daily.  05/17/14  Yes Historical Provider, MD  cholecalciferol (VITAMIN D) 1000 UNITS tablet Take 2,000 Units by mouth daily.   Yes Historical Provider, MD  dorzolamide (TRUSOPT) 2 % ophthalmic solution Place 1 drop into the right eye every 12 (twelve) hours.  05/16/14  Yes Historical Provider, MD  EXELON 4.6 MG/24HR Place 4.6 mg onto the skin daily. Take one off and put the other on 04/11/14  Yes Historical Provider, MD  finasteride (PROSCAR) 5 MG tablet Take 5 mg by mouth daily.  05/03/14  Yes Historical Provider, MD  latanoprost (XALATAN) 0.005 % ophthalmic solution Place 1 drop into the right eye at bedtime.  04/19/14  Yes Historical Provider, MD  mirabegron ER (MYRBETRIQ) 50 MG TB24 tablet Take 50 mg by mouth every evening.   Yes Historical Provider, MD  OVER THE COUNTER MEDICATION Take 1 capsule by mouth 2 (two) times daily. Prostate plus   Yes Historical Provider, MD  senna-docusate (SENOKOT-S) 8.6-50 MG tablet Take 1 tablet by mouth 2 (two) times daily.   Yes Historical Provider, MD    SYNTHROID 50 MCG tablet Take 50 mcg by mouth daily before breakfast.  05/14/14  Yes Historical Provider, MD  tamsulosin (FLOMAX) 0.4 MG CAPS capsule Take 0.8 mg by mouth daily after supper.  05/14/14  Yes Historical Provider, MD  carbamazepine (TEGRETOL XR) 400 MG 12 hr tablet Take 1 tablet (400 mg total) by mouth 2 (two) times daily. 10/20/15   Orpah Greek, MD  sulfamethoxazole-trimethoprim (BACTRIM DS,SEPTRA DS) 800-160 MG tablet Take 1 tablet by mouth 2 (two) times daily. 10/20/15 10/27/15  Orpah Greek, MD   PHYSICAL EXAMDanley Danker Vitals:   10/20/15 [id number] 10/20/15 0615 10/20/15 0624 10/20/15 0645  BP: 108/58 110/66 110/66 169/82  Pulse: 66 71 70   Temp:   97.9 F (36.6 C)   TempSrc:   Oral   Resp: 15 16 18 20   SpO2: 100% 100% 100%     Wt Readings from Last 3 Encounters:  05/29/14 61.236 kg (135 lb)    General:  Thin black male. Appears comfortable Eyes: PER, normal lids, irises & conjunctiva ENT: grossly normal hearing. Tongue with some brusing / lacerations.  Neck: no LAD, no masses Cardiovascular: RRR, RLE 1+ edema, LLE 2+ edema.   Respiratory: Respirations even and unlabored. Normal respiratory effort. Lungs CTA bilaterally, no wheezes / rales .   Abdomen: soft, non-distended, non-tender, active bowel sounds. No obvious masses.  Skin: no rash seen on limited exam Musculoskeletal: Left hand contracted.  psychiatric: alert, cooperative. Follow commands Neurologic: shakes head yes / no, follows commands. States his name but want answer any other questions such as location. Marland Kitchen         LABS ON ADMISSION:    Basic Metabolic Panel:  Recent Labs Lab 10/20/15 0409  NA 141  K 4.1  CL 109  CO2 22  GLUCOSE 83  BUN 26*  CREATININE 1.14  CALCIUM 8.7*    CBG:  Recent Labs Lab 10/20/15 0259  GLUCAP 84   Creatinine clearance cannot be calculated (Unknown ideal weight.)   ASSESSMENT / PLAN   Seizures. Two seizures with oral trauma this a.m. after being  off Tegretol (ran out of medication). Tegretol level less than 2. Neurology consulted by EDP. Per their recommendation, patient has been given 800 mg of Tegretol. -Admit to observation -telemetry bed -Seizure precautions -Treat UTI -Neurology to follow  UTI. UA suspicious, urine malodorous per wife. He also had a UTI 3 months ago per wife -Send urine for culture -Continue Rocephin (initiated in ED)  ? Hypothyroidism, on Synthroid at home but hasn't taken in a month.  -check TSH  Falls at home. -PT evaluation  Coughing with meals. Per wife, this occurs if patient eats too quickly. -SLP swallowing evaluation.    CONSULTANTS:   Neurology Code Status: full code DVT Prophylaxis: Lovenox Family Communication:   Wife at bedside and understands plan of treatment Disposition Plan: Discharge to home in 24-48 hours   Time spent: 60 minutes Tye Savoy  NP [facility] Pager  [phone]     

## 2015-10-21 DIAGNOSIS — W19XXXA Unspecified fall, initial encounter: Secondary | ICD-10-CM | POA: Diagnosis not present

## 2015-10-21 DIAGNOSIS — N400 Enlarged prostate without lower urinary tract symptoms: Secondary | ICD-10-CM | POA: Diagnosis not present

## 2015-10-21 DIAGNOSIS — R5381 Other malaise: Secondary | ICD-10-CM | POA: Diagnosis not present

## 2015-10-21 DIAGNOSIS — G40409 Other generalized epilepsy and epileptic syndromes, not intractable, without status epilepticus: Secondary | ICD-10-CM | POA: Diagnosis present

## 2015-10-21 DIAGNOSIS — M25562 Pain in left knee: Secondary | ICD-10-CM | POA: Diagnosis present

## 2015-10-21 DIAGNOSIS — M25561 Pain in right knee: Secondary | ICD-10-CM | POA: Diagnosis present

## 2015-10-21 DIAGNOSIS — G309 Alzheimer's disease, unspecified: Secondary | ICD-10-CM | POA: Diagnosis present

## 2015-10-21 DIAGNOSIS — E876 Hypokalemia: Secondary | ICD-10-CM | POA: Diagnosis present

## 2015-10-21 DIAGNOSIS — G40909 Epilepsy, unspecified, not intractable, without status epilepticus: Secondary | ICD-10-CM | POA: Diagnosis not present

## 2015-10-21 DIAGNOSIS — Z515 Encounter for palliative care: Secondary | ICD-10-CM

## 2015-10-21 DIAGNOSIS — Z9181 History of falling: Secondary | ICD-10-CM | POA: Diagnosis not present

## 2015-10-21 DIAGNOSIS — R296 Repeated falls: Secondary | ICD-10-CM | POA: Diagnosis present

## 2015-10-21 DIAGNOSIS — R1310 Dysphagia, unspecified: Secondary | ICD-10-CM | POA: Diagnosis present

## 2015-10-21 DIAGNOSIS — E039 Hypothyroidism, unspecified: Secondary | ICD-10-CM | POA: Diagnosis present

## 2015-10-21 DIAGNOSIS — R569 Unspecified convulsions: Secondary | ICD-10-CM | POA: Diagnosis not present

## 2015-10-21 DIAGNOSIS — Z87440 Personal history of urinary (tract) infections: Secondary | ICD-10-CM | POA: Diagnosis not present

## 2015-10-21 DIAGNOSIS — Z880 Allergy status to penicillin: Secondary | ICD-10-CM | POA: Diagnosis not present

## 2015-10-21 DIAGNOSIS — Z79899 Other long term (current) drug therapy: Secondary | ICD-10-CM | POA: Diagnosis not present

## 2015-10-21 DIAGNOSIS — F0280 Dementia in other diseases classified elsewhere without behavioral disturbance: Secondary | ICD-10-CM | POA: Diagnosis present

## 2015-10-21 DIAGNOSIS — R05 Cough: Secondary | ICD-10-CM

## 2015-10-21 DIAGNOSIS — G8191 Hemiplegia, unspecified affecting right dominant side: Secondary | ICD-10-CM | POA: Diagnosis present

## 2015-10-21 DIAGNOSIS — M6281 Muscle weakness (generalized): Secondary | ICD-10-CM | POA: Diagnosis not present

## 2015-10-21 DIAGNOSIS — S098XXA Other specified injuries of head, initial encounter: Secondary | ICD-10-CM | POA: Diagnosis present

## 2015-10-21 DIAGNOSIS — N401 Enlarged prostate with lower urinary tract symptoms: Secondary | ICD-10-CM | POA: Diagnosis not present

## 2015-10-21 DIAGNOSIS — K5909 Other constipation: Secondary | ICD-10-CM | POA: Diagnosis present

## 2015-10-21 DIAGNOSIS — N390 Urinary tract infection, site not specified: Secondary | ICD-10-CM | POA: Diagnosis not present

## 2015-10-21 DIAGNOSIS — R489 Unspecified symbolic dysfunctions: Secondary | ICD-10-CM | POA: Diagnosis not present

## 2015-10-21 DIAGNOSIS — Z7189 Other specified counseling: Secondary | ICD-10-CM

## 2015-10-21 DIAGNOSIS — R278 Other lack of coordination: Secondary | ICD-10-CM | POA: Diagnosis not present

## 2015-10-21 DIAGNOSIS — Z66 Do not resuscitate: Secondary | ICD-10-CM | POA: Diagnosis present

## 2015-10-21 DIAGNOSIS — Z5189 Encounter for other specified aftercare: Secondary | ICD-10-CM | POA: Diagnosis not present

## 2015-10-21 DIAGNOSIS — D61818 Other pancytopenia: Secondary | ICD-10-CM | POA: Diagnosis present

## 2015-10-21 DIAGNOSIS — Z9119 Patient's noncompliance with other medical treatment and regimen: Secondary | ICD-10-CM | POA: Diagnosis not present

## 2015-10-21 DIAGNOSIS — E46 Unspecified protein-calorie malnutrition: Secondary | ICD-10-CM | POA: Diagnosis not present

## 2015-10-21 DIAGNOSIS — Y92009 Unspecified place in unspecified non-institutional (private) residence as the place of occurrence of the external cause: Secondary | ICD-10-CM | POA: Diagnosis not present

## 2015-10-21 DIAGNOSIS — R1312 Dysphagia, oropharyngeal phase: Secondary | ICD-10-CM | POA: Diagnosis not present

## 2015-10-21 DIAGNOSIS — R561 Post traumatic seizures: Secondary | ICD-10-CM | POA: Diagnosis present

## 2015-10-21 LAB — CBC
HCT: 32.9 % — ABNORMAL LOW (ref 39.0–52.0)
Hemoglobin: 10.5 g/dL — ABNORMAL LOW (ref 13.0–17.0)
MCH: 29.5 pg (ref 26.0–34.0)
MCHC: 31.9 g/dL (ref 30.0–36.0)
MCV: 92.4 fL (ref 78.0–100.0)
PLATELETS: 127 10*3/uL — AB (ref 150–400)
RBC: 3.56 MIL/uL — ABNORMAL LOW (ref 4.22–5.81)
RDW: 13.9 % (ref 11.5–15.5)
WBC: 5 10*3/uL (ref 4.0–10.5)

## 2015-10-21 LAB — COMPREHENSIVE METABOLIC PANEL
ALBUMIN: 2.8 g/dL — AB (ref 3.5–5.0)
ALK PHOS: 131 U/L — AB (ref 38–126)
ALT: 20 U/L (ref 17–63)
AST: 32 U/L (ref 15–41)
Anion gap: 6 (ref 5–15)
BUN: 21 mg/dL — AB (ref 6–20)
CALCIUM: 8.4 mg/dL — AB (ref 8.9–10.3)
CO2: 27 mmol/L (ref 22–32)
CREATININE: 1.16 mg/dL (ref 0.61–1.24)
Chloride: 109 mmol/L (ref 101–111)
GFR calc Af Amer: >60 mL/min (ref >60)
GFR calc non Af Amer: 56 mL/min — ABNORMAL LOW (ref >60)
GLUCOSE: 77 mg/dL (ref 65–99)
Potassium: 3.2 mmol/L — ABNORMAL LOW (ref 3.5–5.1)
SODIUM: 142 mmol/L (ref 135–145)
Total Bilirubin: 0.7 mg/dL (ref 0.3–1.2)
Total Protein: 5.3 g/dL — ABNORMAL LOW (ref 6.5–8.1)

## 2015-10-21 LAB — CARBAMAZEPINE LEVEL, TOTAL: CARBAMAZEPINE LVL: 6.5 ug/mL (ref 4.0–12.0)

## 2015-10-21 LAB — AMMONIA: Ammonia: 34 umol/L (ref 9–35)

## 2015-10-21 LAB — T4, FREE: Free T4: 0.62 ng/dL (ref 0.61–1.12)

## 2015-10-21 MED ORDER — LEVOTHYROXINE SODIUM 50 MCG PO TABS: 50.0000 ug | ORAL_TABLET | Freq: Every day | ORAL | Status: DC

## 2015-10-21 MED ORDER — LEVOTHYROXINE SODIUM 50 MCG PO TABS
50.0000 ug | ORAL_TABLET | Freq: Every day | ORAL | Status: DC
  Administered 2015-10-21 – 2015-10-24 (×4): 50 ug via ORAL
  Filled 2015-10-21 (×4): qty 1

## 2015-10-21 NOTE — Consult Note (Signed)
Consultation Note Date: 10/21/2015   Erik Massey Name: Erik Massey  DOB: May 07, 1929  MRN: [id number]  Age / Sex: 81 y.o., male  PCP: Glendale Chard, MD Referring Physician: Mendel Corning, MD  Reason for Consultation: Establishing goals of care  Clinical Assessment/Narrative: Erik Massey is a 81 y.o. male with a history of seizures (last 16 years ago).He is maintained on Tegretol. Erik Massey had a seizure at home morning of admission. Plan was to discharge Erik Massey home but then he had a second seizure with tongue trauma. Per wife, Erik Massey ran out of Tegretol. Wife called for refill but it did not get approved in time and Erik Massey missed some doses. She also reports that overall, he has been having change in functional status over the last few weeks to months.  Palliative consulted for goals of care.  Contacts/Participants in Discussion: Primary Decision Maker: Patients wife with assistance of her daughter HCPOA: None on chart  SUMMARY OF RECOMMENDATIONS  I met with Erik Massey, his wife, and his daughter, [name] (internal medicine physician in town). We reviewed a MOST form and discussed how to develop plan of care to focus on continuing therapies that would maximize chance of being well enough to return home and limiting therapies not in line with this goal.  I discussed with family regarding heroic interventions at the end-of-life. There is agreement this would not be in line with prior expressed wishes for a natural death or be likely to lead to getting well enough to go back home. Family in agreement with changing CODE STATUS to DO NOT RESUSCITATE.  We completed MOST form today. DNR, Limited additional interventions, IVF and ABX if indicated, time limited trial of feeding tube  His family is in agreement with pursuing rehab at SNF  The Erik Massey's daughter, [name], would like a  call from primary attending tomorrow.  Her office number is [phone]  Code Status/Advance Care Planning: DNR    Code Status Orders        Start     Ordered   10/21/15 1843  Do not attempt resuscitation (DNR)   Continuous    Question Answer Comment  In the event of cardiac or respiratory ARREST Do not call a "code blue"   In the event of cardiac or respiratory ARREST Do not perform Intubation, CPR, defibrillation or ACLS   In the event of cardiac or respiratory ARREST Use medication by any route, position, wound care, and other measures to relive pain and suffering. May use oxygen, suction and manual treatment of airway obstruction as needed for comfort.      10/21/15 1842     Symptom Management:   Denies symptoms  Palliative Prophylaxis:   Aspiration, Bowel Regimen and Delirium Protocol  Psycho-social/Spiritual:  Support System: [place] Desire for further Chaplaincy support:No  Prognosis: Unable to determine  Discharge Planning: Likely skilled facility for rehab   Chief Complaint/ Primary Diagnoses: Present on Admission:  . UTI (lower urinary tract infection)  I have reviewed the medical record, interviewed the Erik Massey and family, and examined the Erik Massey. The following aspects are pertinent.  Past Medical History  Diagnosis Date  . Alzheimer disease   . Seizures [facility])    Social History   Social History  . Marital Status: Married    Spouse Name: N/A  . Number of Children: N/A  . Years of Education: N/A   Social History Main Topics  . Smoking status: Never Smoker   . Smokeless tobacco: Never  Used  . Alcohol Use: No  . Drug Use: None  . Sexual Activity: Not Asked   Other Topics Concern  . None   Social History Narrative   Family History  Problem Relation Age of Onset  . Family history unknown: Yes   Scheduled Meds: . betaxolol  1 drop Right Eye Daily  . carbamazepine  400 mg Oral q12n4p  . cefTRIAXone (ROCEPHIN)  IV  1 g Intravenous Q24H  .  dorzolamide  1 drop Right Eye Q12H  . enoxaparin (LOVENOX) injection  40 mg Subcutaneous Q24H  . finasteride  5 mg Oral Daily  . latanoprost  1 drop Right Eye QHS  . levothyroxine  50 mcg Oral QAC breakfast  . LORazepam  1 mg Intravenous Once  . mirabegron ER  50 mg Oral QPM  . rivastigmine  4.6 mg Transdermal Q24H  . tamsulosin  0.4 mg Oral Daily   Continuous Infusions: . sodium chloride 75 mL/hr (10/21/15 1331)   PRN Meds:.polyethylene glycol Medications Prior to Admission:  Prior to Admission medications   Medication Sig Start Date End Date Taking? Authorizing Provider  BETOPTIC-S 0.25 % ophthalmic suspension Place 1 drop into the right eye daily.  05/17/14  Yes Historical Provider, MD  cholecalciferol (VITAMIN D) 1000 UNITS tablet Take 2,000 Units by mouth daily.   Yes Historical Provider, MD  dorzolamide (TRUSOPT) 2 % ophthalmic solution Place 1 drop into the right eye every 12 (twelve) hours.  05/16/14  Yes Historical Provider, MD  EXELON 4.6 MG/24HR Place 4.6 mg onto the skin daily. Take one off and put the other on 04/11/14  Yes Historical Provider, MD  finasteride (PROSCAR) 5 MG tablet Take 5 mg by mouth daily.  05/03/14  Yes Historical Provider, MD  latanoprost (XALATAN) 0.005 % ophthalmic solution Place 1 drop into the right eye at bedtime.  04/19/14  Yes Historical Provider, MD  mirabegron ER (MYRBETRIQ) 50 MG TB24 tablet Take 50 mg by mouth every evening.   Yes Historical Provider, MD  OVER THE COUNTER MEDICATION Take 1 capsule by mouth 2 (two) times daily. Prostate plus   Yes Historical Provider, MD  senna-docusate (SENOKOT-S) 8.6-50 MG tablet Take 1 tablet by mouth 2 (two) times daily.   Yes Historical Provider, MD  SYNTHROID 50 MCG tablet Take 50 mcg by mouth daily before breakfast.  05/14/14  Yes Historical Provider, MD  tamsulosin (FLOMAX) 0.4 MG CAPS capsule Take 0.8 mg by mouth daily after supper.  05/14/14  Yes Historical Provider, MD  carbamazepine (TEGRETOL XR) 400 MG 12 hr  tablet Take 1 tablet (400 mg total) by mouth 2 (two) times daily. 10/20/15   Orpah Greek, MD  sulfamethoxazole-trimethoprim (BACTRIM DS,SEPTRA DS) 800-160 MG tablet Take 1 tablet by mouth 2 (two) times daily. 10/20/15 10/27/15  Orpah Greek, MD   Allergies  Allergen Reactions  . Penicillins Swelling    Has Erik Massey had a PCN reaction causing immediate rash, facial/tongue/throat swelling, SOB or lightheadedness with hypotension: yes Has Erik Massey had a PCN reaction causing severe rash involving mucus membranes or skin necrosis: no Has Erik Massey had a PCN reaction that required hospitalization- yes already in the hospital Has Erik Massey had a PCN reaction occurring within the last 10 years: No If all of the above answers are "NO", then may proceed with Cephalosporin use.     Review of Systems  Unable to perform ROS: Dementia    Physical Exam  General: Alert, NAD, speech is somewhat dysarthric  HEENT: no movement  of left eye, right eye minimally moves   Neck: Supple, no JVD, no masses  CVS: S1 S2 auscultated, no rubs, murmurs or gallops. Regular rate and rhythm.  Respiratory: Clear to auscultation bilaterally, no wheezing, rales or rhonchi  Abdomen: Soft, nontender, nondistended, + bowel sounds  Ext: no cyanosis clubbing or edema  Neuro: does not follow commands consistently although moving all 4 extremities  Skin: No rashes  Psych: Normal affect and demeanor, alert and oriented x 2v Vital Signs: BP 118/55 mmHg  Pulse 61  Temp(Src) 97.6 F (36.4 C) (Oral)  Resp 16  SpO2 98%  SpO2: SpO2: 98 % O2 Device:SpO2: 98 % O2 Flow Rate: .   IO: Intake/output summary:  Intake/Output Summary (Last 24 hours) at 10/21/15 2220 Last data filed at 10/21/15 1900  Gross per 24 hour  Intake   2570 ml  Output      0 ml  Net   2570 ml    LBM: Last BM Date: 10/19/15 Baseline Weight:   Most recent weight:        Palliative Assessment/Data:  Flowsheet Rows        Most  Recent Value   Intake Tab    Referral Department  Hospitalist   Unit at Time of Referral  Med/Surg Unit   Palliative Care Primary Diagnosis  Neurology   Date Notified  10/20/15   Palliative Care Type  New Palliative care   Reason for referral  Clarify Goals of Care   Date of Admission  10/20/15   # of days IP prior to Palliative referral  0   Clinical Assessment    Psychosocial & Spiritual Assessment    Palliative Care Outcomes       Additional Data Reviewed:  CBC:    Component Value Date/Time   WBC 5.0 10/21/2015 1000   HGB 10.5* 10/21/2015 1000   HCT 32.9* 10/21/2015 1000   PLT 127* 10/21/2015 1000   MCV 92.4 10/21/2015 1000   Comprehensive Metabolic Panel:    Component Value Date/Time   NA 142 10/21/2015 1000   K 3.2* 10/21/2015 1000   CL 109 10/21/2015 1000   CO2 27 10/21/2015 1000   BUN 21* 10/21/2015 1000   CREATININE 1.16 10/21/2015 1000   GLUCOSE 77 10/21/2015 1000   CALCIUM 8.4* 10/21/2015 1000   AST 32 10/21/2015 1000   ALT 20 10/21/2015 1000   ALKPHOS 131* 10/21/2015 1000   BILITOT 0.7 10/21/2015 1000   PROT 5.3* 10/21/2015 1000   ALBUMIN 2.8* 10/21/2015 1000     Time In: 1735 Time Out: 1840 Time Total: 75 Greater than 50%  of this time was spent counseling and coordinating care related to the above assessment and plan.  Signed by: Micheline Rough, MD  Micheline Rough, MD  10/21/2015, 10:20 PM  Please contact Palliative Medicine Team phone at [phone] for questions and concerns.           [id number]

## 2015-10-21 NOTE — Progress Notes (Signed)
Speech Language Pathology Treatment: Dysphagia  Patient Details Name: Erik Massey MRN: [id number] DOB: 1929-10-26 Today's Date: 10/21/2015 Time: [id number] SLP Time Calculation (min) (ACUTE ONLY): 12 min  Assessment / Plan / Recommendation Clinical Impression  ST follow up for therapeutic diet tolerance.  Chart review indicated that the patient has been afebrile, lungs are clear and intake has been good.  Nursing reported that the patient continues to eat/drink very quickly.  Meal observation was completed and the patient was also noted to eat/drink quickly.  Cues to slow down were minimally successful when the patient was self feeding.  Cough x 1 was noted given serial sips of thins.  Recommend continue with a regular diet and thin liquids.  Full supervision in an attempt to control rate of intake.  ST to continue to follow.     HPI HPI: Erik Massey is a 81 y.o. male with a history of seizures. Patient has not had a seizure in 16 years, maintained on Tegretol. Patient had a seizure at home this a.m.. In the emergency department Tegretol level was less than 2. After stabilization plan was to discharge patient home but then he had a second seizure with tongue trauma. Per wife, patient ran out of Tegretol. Wife called for refill but it did not get approved in time and patient missed some doses.  Patient's wife provides some details about medical history : Patient has right upper extremity weakness secondary to a fall from a 3 story building in 1961.  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  His wife reported that he is implusive with his intake, always eating very quickly.  She reported no recent respiratory issues.        SLP Plan  Continue with current plan of care     Recommendations  Diet recommendations: Regular;Thin liquid Liquids provided via:  Cup Medication Administration: Crushed with puree Supervision: Staff to assist with self feeding Compensations: Minimize environmental distractions;Slow rate;Small sips/bites;Follow solids with liquid Postural Changes and/or Swallow Maneuvers: Seated upright 90 degrees;Upright 30-60 min after meal              Oral Care Recommendations: Oral care BID Follow up Recommendations:  (TBD) Plan: Continue with current plan of care  Shelly Flatten, MA, [place] [facility] SLP [phone] Lamar Sprinkles 10/21/2015, 2:48 PM

## 2015-10-21 NOTE — NC FL2 (Signed)
[place] LEVEL OF CARE SCREENING TOOL     IDENTIFICATION  Patient Name: Erik Massey Birthdate: 04/23/1929 Sex: male Admission Date (Current Location): 10/20/2015  [facility] and [place] Number: [place] and Address:  [facility] [place], [place] [address], [place],  [address]      Provider Number: [id number]  Attending Physician Name and Address:  Mendel Corning, MD  Relative Name and Phone Number:       Current Level of Care: Hospital Recommended Level of Care: [place] Prior Approval Number:    Date Approved/Denied:   PASRR Number: [id number]  Discharge Plan: SNF    Current Diagnoses: Patient Active Problem List   Diagnosis Date Noted  . Seizure ([place]) 10/20/2015  . UTI (lower urinary tract infection) 10/20/2015  . Falls 10/20/2015  . Cough 10/20/2015  . Physical deconditioning   . BPH (benign prostatic hyperplasia)     Orientation RESPIRATION BLADDER Height & Weight    Self, Time  Normal Continent 5\' 7"  (170.2 cm) 135 lbs.  BEHAVIORAL SYMPTOMS/MOOD NEUROLOGICAL BOWEL NUTRITION STATUS   (NONE )  (NONE) Continent Diet (HEART HEALTHY )  AMBULATORY STATUS COMMUNICATION OF NEEDS Skin   Extensive Assist Verbally Normal                       Personal Care Assistance Level of Assistance  Bathing, Dressing Bathing Assistance: Maximum assistance   Dressing Assistance: Maximum assistance     Functional Limitations Info   (NONE )          SPECIAL CARE FACTORS FREQUENCY  PT (By licensed PT), OT (By licensed OT)     PT Frequency: 3 OT Frequency: 2            Contractures      Additional Factors Info  Code Status, Allergies Code Status Info: FULL CODE  Allergies Info: PENICILLINS           Current Medications (10/21/2015):  This is the current hospital active medication list Current Facility-Administered Medications  Medication Dose Route Frequency Provider Last Rate  Last Dose  . 0.9 %  sodium chloride infusion  75 mL/hr Intravenous Continuous Willia Craze, NP 75 mL/hr at 10/21/15 1331 75 mL/hr at 10/21/15 1331  . betaxolol (BETOPTIC-S) 0.25 % ophthalmic suspension 1 drop  1 drop Right Eye Daily Willia Craze, NP   1 drop at 10/21/15 0853  . carbamazepine (TEGRETOL) tablet 400 mg  400 mg Oral q12n4p Amie Portland, MD   400 mg at 10/21/15 1112  . cefTRIAXone (ROCEPHIN) 1 g in dextrose 5 % 50 mL IVPB  1 g Intravenous Q24H Willia Craze, NP   1 g at 10/21/15 1328  . dorzolamide (TRUSOPT) 2 % ophthalmic solution 1 drop  1 drop Right Eye Q12H Willia Craze, NP   1 drop at 10/21/15 0853  . enoxaparin (LOVENOX) injection 40 mg  40 mg Subcutaneous Q24H Willia Craze, NP   40 mg at 10/21/15 1112  . finasteride (PROSCAR) tablet 5 mg  5 mg Oral Daily Willia Craze, NP   5 mg at 10/21/15 0853  . latanoprost (XALATAN) 0.005 % ophthalmic solution 1 drop  1 drop Right Eye QHS Willia Craze, NP   1 drop at 10/20/15 2126  . levothyroxine (SYNTHROID, LEVOTHROID) tablet 50 mcg  50 mcg Oral QAC breakfast Ripudeep Krystal Eaton, MD   50 mcg at 10/21/15 [phone]  .  LORazepam (ATIVAN) injection 1 mg  1 mg Intravenous Once Orpah Greek, MD      . mirabegron ER [facility]) tablet 50 mg  50 mg Oral QPM Willia Craze, NP   50 mg at 10/20/15 1735  . polyethylene glycol (MIRALAX / GLYCOLAX) packet 17 g  17 g Oral Daily PRN Willia Craze, NP      . rivastigmine (EXELON) 4.6 mg/24hr 4.6 mg  4.6 mg Transdermal Q24H Willia Craze, NP   4.6 mg at 10/21/15 1112  . tamsulosin (FLOMAX) capsule 0.4 mg  0.4 mg Oral Daily Waldemar Dickens, MD   0.4 mg at 10/21/15 [id number]     Discharge Medications: Please see discharge summary for a list of discharge medications.  Relevant Imaging Results:  Relevant Lab Results:   Additional Information SSN [id number]  Glendon Axe, MSW, LCSWA [phone] 10/21/2015 4:45 PM

## 2015-10-21 NOTE — Progress Notes (Signed)
[facility]                                                                              Patient Demographics  Erik Massey, is a 81 y.o. male, DOB - 1929/09/23, [id number]  Admit date - 10/20/2015   Admitting Physician Waldemar Dickens, MD  Outpatient Primary MD for the patient is Maximino Greenland, MD  LOS -    Chief Complaint  Patient presents with  . Seizures       Brief HPI   Erik Massey is a 81 y.o. male with a history of seizures. Patient has not had a seizure in 16 years, maintained on Tegretol. Patient had a seizure at home this a.m.. In the emergency department Tegretol level was less than 2. After stabilization plan was to discharge patient home but then he had a second seizure with tongue trauma. Per wife, patient ran out of Tegretol. Wife called for refill but it did not get approved in time and patient missed some doses.  Patient's wife provides some details about medical history : Patient has right upper extremity weakness secondary to a fall from a 3 story building many years ago. Patient has been losing his balance and falling at home recently. He has chronic constipation with hard, large stools. He cough if eats or drinks too rapidly.  Patient was admitted for seizures  Assessment & Plan    Principal Problem: Seizures. Two seizures with oral trauma on the morning of admission, after being off Tegretol (ran out of medication). Tegretol level less than 2.  - Neurology consulted, appreciate recommendations, seizures likely due to the combination of not taking the Tegretol for 2-3 days and urinary tract infection. - Patient started on Tegretol - Follow Tegretol level in a.m.   Active problems Hypothyroidism - TSH 12.4, free T4 0.6 - Patient was on Synthroid 50 MCG daily, has not taken it for a month, restarted, will need TSH checked in 4 weeks.   UTI. - Follow urine culture and sensitivities, continue IV Rocephin  Falls at  home. -PT evaluation  Coughing with meals. Per wife, this occurs if patient eats too quickly. -SLP swallowing evaluation done, recommending regular food.    Code Status: Full CODE STATUS  Family Communication: Discussed in detail with the patient, all imaging results, lab results explained to the patient    Disposition Plan: Hopefully DC home in a.m., follow urine culture and sensitivities, PT evaluation  Time Spent in minutes   25 minutes  Procedures    Consults   Neurology  DVT Prophylaxis  Lovenox   Medications  Scheduled Meds: . betaxolol  1 drop Right Eye Daily  . carbamazepine  400 mg Oral q12n4p  . cefTRIAXone (ROCEPHIN)  IV  1 g Intravenous Q24H  . dorzolamide  1 drop Right Eye Q12H  . enoxaparin (LOVENOX) injection  40 mg Subcutaneous Q24H  . finasteride  5 mg Oral Daily  . latanoprost  1 drop Right Eye QHS  . levothyroxine  50 mcg Oral QAC breakfast  . LORazepam  1 mg Intravenous Once  . mirabegron ER  50 mg Oral QPM  .  rivastigmine  4.6 mg Transdermal Q24H  . tamsulosin  0.4 mg Oral Daily   Continuous Infusions: . sodium chloride 75 mL/hr (10/20/15 0944)   PRN Meds:.polyethylene glycol   Antibiotics   Anti-infectives    Start     Dose/Rate Route Frequency Ordered Stop   10/21/15 0800  cefTRIAXone (ROCEPHIN) 1 g in dextrose 5 % 50 mL IVPB     1 g 100 mL/hr over 30 Minutes Intravenous Every 24 hours 10/20/15 0829     10/20/15 0730  cefTRIAXone (ROCEPHIN) 1 g in dextrose 5 % 50 mL IVPB     1 g 100 mL/hr over 30 Minutes Intravenous  Once 10/20/15 0716 10/20/15 0856   10/20/15 0630  sulfamethoxazole-trimethoprim (BACTRIM DS,SEPTRA DS) 800-160 MG per tablet 1 tablet  Status:  Discontinued     1 tablet Oral  Once 10/20/15 0615 10/20/15 0935   10/20/15 0000  sulfamethoxazole-trimethoprim (BACTRIM DS,SEPTRA DS) 800-160 MG tablet     1 tablet Oral 2 times daily 10/20/15 0609 10/27/15 2359        Subjective:   Erik Massey was seen and examined  today. Alert and awake, no acute issues overnight, denies any denies dizziness, chest pain, shortness of breath, abdominal pain, N/V/D/C, new weakness, numbess, tingling. No fevers or chills.  Objective:   Blood pressure 108/69, pulse 69, temperature 98.4 F (36.9 C), temperature source Oral, resp. rate 17, SpO2 100 %.  Wt Readings from Last 3 Encounters:  05/29/14 61.236 kg (135 lb)     Intake/Output Summary (Last 24 hours) at 10/21/15 1159 Last data filed at 10/21/15 0800  Gross per 24 hour  Intake    480 ml  Output      0 ml  Net    480 ml    Exam  General: Alert and oriented x 2 , NAD, speech is somewhat dysarthric  HEENT: no movement of left eye, right eye minimally moves   Neck: Supple, no JVD, no masses  CVS: S1 S2 auscultated, no rubs, murmurs or gallops. Regular rate and rhythm.  Respiratory: Clear to auscultation bilaterally, no wheezing, rales or rhonchi  Abdomen: Soft, nontender, nondistended, + bowel sounds  Ext: no cyanosis clubbing or edema  Neuro: does not follow commands consistently although moving all 4 extremities  Skin: No rashes  Psych: Normal affect and demeanor, alert and oriented x 2   Data Review   Micro Results No results found for this or any previous visit (from the past 240 hour(s)).  Radiology Reports Dg Tibia/fibula Left  09/26/2015  CLINICAL DATA:  Fall. Swelling noted to distal left tib/fib. Pt has bilateral knee and leg pain after falling on oct 13. Pain is getting progressively worse and is affecting his mobility. Pt. has been falling more frequently since. Hx alzheimer's. EXAM: LEFT TIBIA AND FIBULA - 2 VIEW COMPARISON:  None. FINDINGS: No fracture.  No bone lesion. Knee and ankle joints are normally aligned. There are no significant arthropathic changes. Mild subcutaneous soft tissue edema is noted most evident at the ankle. IMPRESSION: 1. No fracture or acute finding. 2. No bone lesion. 3. Mild soft tissue edema. Electronically  Signed   By: Lajean Manes M.D.   On: 09/26/2015 16:40   Ct Head Wo Contrast  09/26/2015  CLINICAL DATA:  Unwitnessed fall, Alzheimer's disease EXAM: CT HEAD WITHOUT CONTRAST CT CERVICAL SPINE WITHOUT CONTRAST TECHNIQUE: Multidetector CT imaging of the head and cervical spine was performed following the standard protocol without intravenous contrast. Multiplanar CT image  reconstructions of the cervical spine were also generated. COMPARISON:  MRI brain dated 02/01/2012. FINDINGS: CT HEAD FINDINGS No evidence of parenchymal hemorrhage or extra-axial fluid collection. No mass lesion, mass effect, or midline shift. No CT evidence of acute infarction. Encephalomalacic changes involving the bilateral frontal lobes and left parietal lobe. Overlying postsurgical changes. Extensive subcortical white matter and periventricular small vessel ischemic changes. Global cortical and central atrophy.  Secondary ventriculomegaly. Postsurgical changes involving the frontal and anterior ethmoid sinuses. Bilateral mastoid air cells are clear. No evidence of calvarial fracture. CT CERVICAL SPINE FINDINGS Normal cervical lordosis. No evidence of fracture or dislocation. Vertebral body heights are maintained. Dens appears intact. No prevertebral soft tissue swelling. Moderate multilevel degenerative changes. Visualized thyroid is unremarkable. Visualized lung apices are notable for mild centrilobular and paraseptal emphysematous changes and mild right upper lobe scarring. IMPRESSION: No evidence of acute intracranial abnormality. Encephalomalacic/postsurgical changes involving the bilateral upper lobes and left parietal lobe. Extensive small vessel ischemic changes. Atrophy with secondary ventriculomegaly. No evidence of traumatic injury to the cervical spine. Moderate multilevel degenerative changes. Electronically Signed   By: Julian Hy M.D.   On: 09/26/2015 16:38   Ct Cervical Spine Wo Contrast  09/26/2015  CLINICAL  DATA:  Unwitnessed fall, Alzheimer's disease EXAM: CT HEAD WITHOUT CONTRAST CT CERVICAL SPINE WITHOUT CONTRAST TECHNIQUE: Multidetector CT imaging of the head and cervical spine was performed following the standard protocol without intravenous contrast. Multiplanar CT image reconstructions of the cervical spine were also generated. COMPARISON:  MRI brain dated 02/01/2012. FINDINGS: CT HEAD FINDINGS No evidence of parenchymal hemorrhage or extra-axial fluid collection. No mass lesion, mass effect, or midline shift. No CT evidence of acute infarction. Encephalomalacic changes involving the bilateral frontal lobes and left parietal lobe. Overlying postsurgical changes. Extensive subcortical white matter and periventricular small vessel ischemic changes. Global cortical and central atrophy.  Secondary ventriculomegaly. Postsurgical changes involving the frontal and anterior ethmoid sinuses. Bilateral mastoid air cells are clear. No evidence of calvarial fracture. CT CERVICAL SPINE FINDINGS Normal cervical lordosis. No evidence of fracture or dislocation. Vertebral body heights are maintained. Dens appears intact. No prevertebral soft tissue swelling. Moderate multilevel degenerative changes. Visualized thyroid is unremarkable. Visualized lung apices are notable for mild centrilobular and paraseptal emphysematous changes and mild right upper lobe scarring. IMPRESSION: No evidence of acute intracranial abnormality. Encephalomalacic/postsurgical changes involving the bilateral upper lobes and left parietal lobe. Extensive small vessel ischemic changes. Atrophy with secondary ventriculomegaly. No evidence of traumatic injury to the cervical spine. Moderate multilevel degenerative changes. Electronically Signed   By: Julian Hy M.D.   On: 09/26/2015 16:38    CBC  Recent Labs Lab 10/21/15 1000  WBC 5.0  HGB 10.5*  HCT 32.9*  PLT 127*  MCV 92.4  MCH 29.5  MCHC 31.9  RDW 13.9    Chemistries   Recent  Labs Lab 10/20/15 0409 10/21/15 1000  NA 141 142  K 4.1 3.2*  CL 109 109  CO2 22 27  GLUCOSE 83 77  BUN 26* 21*  CREATININE 1.14 1.16  CALCIUM 8.7* 8.4*  AST  --  32  ALT  --  20  ALKPHOS  --  131*  BILITOT  --  0.7   ------------------------------------------------------------------------------------------------------------------ CrCl cannot be calculated (Unknown ideal weight.). ------------------------------------------------------------------------------------------------------------------ No results for input(s): HGBA1C in the last 72 hours. ------------------------------------------------------------------------------------------------------------------ No results for input(s): CHOL, HDL, LDLCALC, TRIG, CHOLHDL, LDLDIRECT in the last 72 hours. ------------------------------------------------------------------------------------------------------------------  Recent Labs  10/20/15 1420  TSH 12.427*   ------------------------------------------------------------------------------------------------------------------  No results for input(s): VITAMINB12, FOLATE, FERRITIN, TIBC, IRON, RETICCTPCT in the last 72 hours.  Coagulation profile No results for input(s): INR, PROTIME in the last 168 hours.  No results for input(s): DDIMER in the last 72 hours.  Cardiac Enzymes No results for input(s): CKMB, TROPONINI, MYOGLOBIN in the last 168 hours.  Invalid input(s): CK ------------------------------------------------------------------------------------------------------------------ Invalid input(s): POCBNP   Recent Labs  10/20/15 0259  GLUCAP 84     Kameran Lallier M.D. [facility] 10/21/2015, 11:59 AM  Pager: [phone] Between 7am to 7pm - call Pager - [phone]  After 7pm go to [email or web address] - password [email or web address]  Call night coverage person covering after 7pm

## 2015-10-21 NOTE — Progress Notes (Signed)
Subjective: No further seizures.   Exam: Filed Vitals:   10/21/15 0116 10/21/15 0655  BP: 110/59 117/63  Pulse: 68 65  Temp: 98.2 F (36.8 C) 98 F (36.7 C)  Resp: 16 16       Gen: In bed, NAD MS: alert, speech dysarthric. Does not follow commands.  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   Recommendations: 1) Tegretol level. If therapeutic no further recommendations.    Etta Quill PA-C [facility] [phone]   10/21/2015, 9:13 AM Patient seen and examined together with physician assistant and I concur with the assessment and plan.  Dorian Pod, MD

## 2015-10-21 NOTE — Progress Notes (Signed)
Palliative Medicine Team consult was received.   I stopped by to meet with Erik Massey.  No family present.  He did not arouse to verbal or gentle tactile stimulation.  I called and left a VM for his wife asking her to return call.  We will plan to meet with family at the earliest possible time they are available and we have a provider available.   If there are urgent needs or questions please call [phone]. Thank you for consulting out team to assist with this patients care.  Micheline Rough, MD [place] Team [phone]

## 2015-10-21 NOTE — Evaluation (Signed)
Physical Therapy Evaluation Patient Details Name: Erik Massey MRN: [id number] DOB: [birth year] Today's Date: 10/21/2015   History of Present Illness  Erik Massey is a 81 y.o. male admitted due to seizures. Patient has not had a seizure in 16 years. +UTI PMHx- Alzheimers, RUE weakness/contracture secondary to a fall from a 3 story building in 1961; implusivity s/p TBI     Clinical Impression  Pt with above diagnosis. Currently with large functional decline (per OT's conversation with pt's wife on 12/12). Pt currently with functional limitations due to the deficits listed below (see PT Problem List). Pt will benefit from skilled PT to increase their independence and safety with mobility to allow discharge to the venue listed below.       Follow Up Recommendations SNF (if pt/wife agree; pt with large functional decline)    Equipment Recommendations  None recommended by PT    Recommendations for Other Services       Precautions / Restrictions Restrictions Weight Bearing Restrictions: No      Mobility  Bed Mobility Overal bed mobility: Needs Assistance Bed Mobility: Sit to Supine;Rolling Rolling: Max assist     Sit to supine: Max assist   General bed mobility comments: assist to control trunk and raise legs onto bed; +2 for scooting up in bed;   Transfers Overall transfer level: Needs assistance Equipment used: 2 person hand held assist Transfers: Sit to/from [facility] Sit to Stand: Mod assist;+2 physical assistance Stand pivot transfers: Mod assist;+2 physical assistance       General transfer comment: Multimodal cueing for initiation, sequencing, safety.   Ambulation/Gait             General Gait Details: unable  Stairs            Wheelchair Mobility    Modified Rankin (Stroke Patients Only)       Balance   Sitting-balance support: No upper extremity supported;Feet supported Sitting balance-Leahy Scale: Poor      Standing balance support: Single extremity supported Standing balance-Leahy Scale: Poor                               Pertinent Vitals/Pain Pain Assessment: Faces Faces Pain Scale: No hurt    Home Living Family/patient expects to be discharged to:: Private residence Living Arrangements: Spouse/significant other Available Help at Discharge: Family;Available 24 hours/day Type of Home: House Home Access: Stairs to enter   [employer] of Steps: 3 Home Layout: Two level;1/2 bath on main level Home Equipment: Tub bench;Cane - quad      Prior Function Level of Independence: Needs assistance   Gait / Transfers Assistance Needed: mod I using cane  ADL's / Homemaking Assistance Needed: mod assist for bathing and dressing, mod I with cane ambulating to/from bathroom        Hand Dominance   Dominant Hand: Right    Extremity/Trunk Assessment   Upper Extremity Assessment: Defer to OT evaluation           Lower Extremity Assessment: Generalized weakness;RLE deficits/detail RLE Deficits / Details: difficulty advancing RLE when transferring/stepping chair to bed       Communication   Communication: Receptive difficulties;Expressive difficulties  Cognition Arousal/Alertness: Awake/alert Behavior During Therapy: Restless;Agitated Overall Cognitive Status: Difficult to assess                      General Comments General comments (skin integrity, edema, etc.):  No family present. Prior status based on OT's evaluation when wife present    Exercises        Assessment/Plan    PT Assessment Patient needs continued PT services  PT Diagnosis Difficulty walking   PT Problem List Decreased strength;Decreased balance;Decreased mobility;Decreased safety awareness;Impaired tone  PT Treatment Interventions DME instruction;Gait training;Functional mobility training;Therapeutic activities;Balance training;Neuromuscular re-education;Cognitive  remediation;Patient/family education   PT Goals (Current goals can be found in the Care Plan section) Acute Rehab PT Goals Patient Stated Goal: none stated (garbled speech) PT Goal Formulation: Patient unable to participate in goal setting Time For Goal Achievement: 11/04/15 Potential to Achieve Goals: Fair    Frequency Min 3X/week   Barriers to discharge        Co-evaluation               End of Session   Activity Tolerance: Treatment limited secondary to agitation Patient left: in bed;with nursing/sitter in room      Functional Assessment Tool Used: clinical judgement Functional Limitation: Mobility: Walking and moving around Mobility: Walking and Moving Around Current Status [id number]): 100 percent impaired, limited or restricted Mobility: Walking and Moving Around Goal Status [id number]): At least 1 percent but less than 20 percent impaired, limited or restricted    Time: 0907-0918 PT Time Calculation (min) (ACUTE ONLY): 11 min   Charges:   PT Evaluation $Initial PT Evaluation Tier I: 1 Procedure     PT G Codes:   PT G-Codes **NOT FOR INPATIENT CLASS** Functional Assessment Tool Used: clinical judgement Functional Limitation: Mobility: Walking and moving around Mobility: Walking and Moving Around Current Status [id number]): 100 percent impaired, limited or restricted Mobility: Walking and Moving Around Goal Status [id number]): At least 1 percent but less than 20 percent impaired, limited or restricted    Alexanderjames Berg 10/21/2015, 12:08 PM Pager [phone]

## 2015-10-22 DIAGNOSIS — R569 Unspecified convulsions: Secondary | ICD-10-CM

## 2015-10-22 LAB — CBC
HEMATOCRIT: 33.2 % — AB (ref 39.0–52.0)
HEMOGLOBIN: 10.8 g/dL — AB (ref 13.0–17.0)
MCH: 29.8 pg (ref 26.0–34.0)
MCHC: 32.5 g/dL (ref 30.0–36.0)
MCV: 91.5 fL (ref 78.0–100.0)
Platelets: 142 10*3/uL — ABNORMAL LOW (ref 150–400)
RBC: 3.63 MIL/uL — ABNORMAL LOW (ref 4.22–5.81)
RDW: 13.9 % (ref 11.5–15.5)
WBC: 3.5 10*3/uL — ABNORMAL LOW (ref 4.0–10.5)

## 2015-10-22 LAB — BASIC METABOLIC PANEL
ANION GAP: 7 (ref 5–15)
BUN: 19 mg/dL (ref 6–20)
CHLORIDE: 113 mmol/L — AB (ref 101–111)
CO2: 21 mmol/L — AB (ref 22–32)
Calcium: 8 mg/dL — ABNORMAL LOW (ref 8.9–10.3)
Creatinine, Ser: 1.08 mg/dL (ref 0.61–1.24)
GFR calc Af Amer: >60 mL/min (ref >60)
GLUCOSE: 77 mg/dL (ref 65–99)
POTASSIUM: 3.8 mmol/L (ref 3.5–5.1)
Sodium: 141 mmol/L (ref 135–145)

## 2015-10-22 LAB — TSH: TSH: 7.216 u[IU]/mL — ABNORMAL HIGH (ref 0.350–4.500)

## 2015-10-22 LAB — T3: T3, Total: 79 ng/dL (ref 71–180)

## 2015-10-22 NOTE — Care Management Note (Signed)
Case Management Note  Patient Details  Name: DEZION JEANLOUIS MRN: [id number] Date of Birth: 11/04/1929  Subjective/Objective:    Patient admitted with seizure and UTI. Patient is from home with his spouse.                Action/Plan:   Plan is for SNF at discharge. CM will continue to follow for discharge needs.    Expected Discharge Date:                  Expected Discharge Plan:  [place]  In-House Referral:     Discharge planning Services  CM Consult  Post Acute Care Choice:    Choice offered to:     DME Arranged:    DME Agency:     HH Arranged:    [place] Agency:     Status of Service:  In process, will continue to follow  Medicare Important Message Given:    Date Medicare IM Given:    Medicare IM give by:    Date Additional Medicare IM Given:    Additional Medicare Important Message give by:     If discussed at [place] of Stay Meetings, dates discussed:    Additional Comments:  Pollie Friar, RN 10/22/2015, 3:36 PM

## 2015-10-22 NOTE — Clinical Social Work Note (Signed)
Clinical Social Work Assessment  Patient Details  Name: Erik Massey MRN: [id number] Date of Birth: 03-19-1929  Date of referral:  10/22/15               Reason for consult:  Facility Placement, Discharge Planning                Permission sought to share information with:  Case Manager, Customer service manager, Family Supports Permission granted to share information::  Yes, Verbal Permission Granted  Name::      (Erik Massey)  Agency::   (SNF's )  Relationship::   (Spouse )  Contact Information:   [phone])  Housing/Transportation Living arrangements for the past 2 months:  Single Family Home Source of Information:  Spouse Patient Interpreter Needed:  None Criminal Activity/Legal Involvement Pertinent to Current Situation/Hospitalization:  No - Comment as needed Significant Relationships:  Spouse Lives with:  Spouse Do you feel safe going back to the place where you live?  No Need for family participation in patient care:  Yes (Comment)  Care giving concerns:  Patient requiring SNF placement.    Social Worker assessment / plan:  Holiday representative spoke with patient's wife, Erik in reference to post-acute placement for SNF. CSW introduced CSW role and SNF process. CSW also reviewed SNF list. [name] is agreeable to SNF placement. CSW reviewed Medicare coverage for SNF and 3-night qualifying stay. [name] expressed understanding. FL-2 completed and faxed. Bed offers with SNF list given. No further concerns reported at this time. CSW remains available as needed.   Employment status:  Retired Forensic scientist:  Medicare PT Recommendations:  [place] / Referral to community resources:  [place]  Patient/Family's Response to care:  Pt disoriented. [name] agreeable to SNF placement. [name] pleasant, polite, and appreciated social work intervention.   Patient/Family's Understanding of and Emotional  Response to Diagnosis, Current Treatment, and Prognosis:  [name] knowledgeable of medical interventions and on-going treatment. [name] also expressed understanding of 3 night qualifying stay under Medicare guidelines.  Emotional Assessment Appearance:  Developmentally appropriate Attitude/Demeanor/Rapport:   (Pleasant ) Affect (typically observed):  Accepting, Appropriate, Pleasant Orientation:  Oriented to Self Alcohol / Substance use:  Not Applicable Psych involvement (Current and /or in the community):  No (Comment)  Discharge Needs  Concerns to be addressed:  Care Coordination Readmission within the last 30 days:  No Current discharge risk:  Dependent with Mobility Barriers to Discharge:  Barriers Resolved   Glendon Axe, MSW, LCSWA [phone] 10/22/2015 11:30 AM

## 2015-10-22 NOTE — Progress Notes (Signed)
PROGRESS NOTE    Erik Massey [id number] DOB: 1928/12/27 DOA: 10/20/2015 PCP: Maximino Greenland, MD  HPI/Brief narrative 81 year old male with history of seizures, had not had seizures since 16 years, maintained on Tegretol, presented with seizure at home on day of admission. In the ED Tegretol level was less than 2. After stabilization in ED, plan was to discharge patient home but he had a second seizure with tongue trauma. Patient apparently ran out of Tegretol. Chronic right upper extremity weakness secondary to fall several years ago.  Assessment/Plan:  Seizure disorder - Patient had 2 seizures with oral trauma on the morning of admission after being off Tegretol (ran out of medications) - Initial Tegretol level less than 2. - Neurology consulted. Seizures likely due to combination of not taking Tegretol for 2-3 days and UTI. - Started back on Tegretol. - Repeat Tegretol level 10/21/15:6.5.  Hypothyroid - TSH: 12.4. Free T4: 0.6 - As per records, patient was on Synthroid 50 g daily and had not taken it for a month. Restarted in the hospital. Repeat TSH in the 7 range. Follow TSH as outpatient in 4-6 weeks.  UTI - Patient empirically placed on IV Rocephin. Unfortunately no cultures were sent. Changed to oral Ceftin at discharge to complete total one-week off antibiotics.  Dysphagia - Speech therapy evaluated and recommended a regular diet and thin liquids.  Falls at home - Patient will be discharged to SNF.  Pancytopenia - Follow CBC in a.m.   DVT prophylaxis: Lovenox Code Status: DNR Family Communication: Discussed with patient's daughter [name] on 12/14. Updated care and answered questions Disposition Plan: DC to SNF possibly 10/24/2015   Consultants:  Neurology  Palliative Care Medicine  Procedures:  None at bedside  Antibiotics:  IV Rocephin 12/12 >  Subjective: Denies complaints.  Objective: Filed Vitals:   10/22/15 0301  10/22/15 0536 10/22/15 0926 10/22/15 1408  BP: 143/61 147/79 153/82 118/60  Pulse: 62 55 70 67  Temp: 98.3 F (36.8 C) 97.6 F (36.4 C) 98.2 F (36.8 C) 98.2 F (36.8 C)  TempSrc: Oral Oral Oral Oral  Resp: 16 16 16 16   SpO2: 100% 100% 100% 99%    Intake/Output Summary (Last 24 hours) at 10/22/15 1621 Last data filed at 10/22/15 0802  Gross per 24 hour  Intake  927.5 ml  Output      0 ml  Net  927.5 ml   There were no vitals filed for this visit.   Exam:  General exam: Pleasant elderly male lying comfortably supine in bed. Respiratory system: Clear. No increased work of breathing. Cardiovascular system: S1 & S2 heard, RRR. No JVD, murmurs, gallops, clicks or pedal edema. Telemetry: Sinus rhythm. Gastrointestinal system: Abdomen is nondistended, soft and nontender. Normal bowel sounds heard. Central nervous system: Alert and oriented to self and partly to place. No focal neurological deficits. Extremities: Contractures of right upper extremity and limited movement-able to slightly raise arm off the bed. Contractures of left hand/fingers. Grade 3 x 5 power at least in the other 3 limbs.   Data Reviewed: Basic Metabolic Panel:  Recent Labs Lab 10/20/15 0409 10/21/15 1000 10/22/15 0517  NA 141 142 141  K 4.1 3.2* 3.8  CL 109 109 113*  CO2 22 27 21*  GLUCOSE 83 77 77  BUN 26* 21* 19  CREATININE 1.14 1.16 1.08  CALCIUM 8.7* 8.4* 8.0*   Liver Function Tests:  Recent Labs Lab 10/21/15 1000  AST 32  ALT 20  ALKPHOS  131*  BILITOT 0.7  PROT 5.3*  ALBUMIN 2.8*   No results for input(s): LIPASE, AMYLASE in the last 168 hours.  Recent Labs Lab 10/21/15 1000  AMMONIA 34   CBC:  Recent Labs Lab 10/21/15 1000 10/22/15 0517  WBC 5.0 3.5*  HGB 10.5* 10.8*  HCT 32.9* 33.2*  MCV 92.4 91.5  PLT 127* 142*   Cardiac Enzymes: No results for input(s): CKTOTAL, CKMB, CKMBINDEX, TROPONINI in the last 168 hours. BNP (last 3 results) No results for input(s):  PROBNP in the last 8760 hours. CBG:  Recent Labs Lab 10/20/15 0259  GLUCAP 84    No results found for this or any previous visit (from the past 240 hour(s)).        Studies: No results found.      Scheduled Meds: . betaxolol  1 drop Right Eye Daily  . carbamazepine  400 mg Oral q12n4p  . cefTRIAXone (ROCEPHIN)  IV  1 g Intravenous Q24H  . dorzolamide  1 drop Right Eye Q12H  . enoxaparin (LOVENOX) injection  40 mg Subcutaneous Q24H  . finasteride  5 mg Oral Daily  . latanoprost  1 drop Right Eye QHS  . levothyroxine  50 mcg Oral QAC breakfast  . LORazepam  1 mg Intravenous Once  . mirabegron ER  50 mg Oral QPM  . rivastigmine  4.6 mg Transdermal Q24H  . tamsulosin  0.4 mg Oral Daily   Continuous Infusions: . sodium chloride 75 mL/hr (10/21/15 1331)    Principal Problem:   Seizure ([place]) Active Problems:   UTI (lower urinary tract infection)   Falls   Cough   Physical deconditioning   BPH (benign prostatic hyperplasia)    Time spent: 25 minutes.    Vernell Leep, MD, FACP, FHM. [facility] Pager [phone]  If 7PM-7AM, please contact night-coverage [email or web address] Password [email or web address] 10/22/2015, 4:21 PM    LOS: 1 day

## 2015-10-22 NOTE — Clinical Social Work Placement (Signed)
   CLINICAL SOCIAL WORK PLACEMENT  NOTE  Date:  10/22/2015  Patient Details  Name: KIMOTHY LEONHART MRN: [id number] Date of Birth: 02/11/1929  Clinical Social Work is seeking post-discharge placement for this patient at the [place] level of care (*CSW will initial, date and re-position this form in  chart as items are completed):  Yes   Patient/family provided with [facility] Work Department's list of facilities offering this level of care within the geographic area requested by the patient (or if unable, by the patient's family).  Yes   Patient/family informed of their freedom to choose among providers that offer the needed level of care, that participate in Medicare, Medicaid or managed care program needed by the patient, have an available bed and are willing to accept the patient.  Yes   Patient/family informed of [facility]'s ownership interest in [facility] and [facility], as well as of the fact that they are under no obligation to receive care at these facilities.  PASRR submitted to EDS on 10/22/15     PASRR number received on 10/22/15     Existing PASRR number confirmed on       FL2 transmitted to all facilities in geographic area requested by pt/family on 10/22/15     FL2 transmitted to all facilities within larger geographic area on       Patient informed that his/her managed care company has contracts with or will negotiate with certain facilities, including the following:        Yes   Patient/family informed of bed offers received.  Patient chooses bed at       Physician recommends and patient chooses bed at      Patient to be transferred to   on  .  Patient to be transferred to facility by       Patient family notified on   of transfer.  Name of family member notified:        PHYSICIAN Please sign FL2, Please sign DNR     Additional Comment:    _______________________________________________ Rozell Searing, LCSW 10/22/2015, 11:30 AM

## 2015-10-23 DIAGNOSIS — N390 Urinary tract infection, site not specified: Secondary | ICD-10-CM

## 2015-10-23 LAB — CBC
HCT: 37.4 % — ABNORMAL LOW (ref 39.0–52.0)
HEMOGLOBIN: 11.9 g/dL — AB (ref 13.0–17.0)
MCH: 29.7 pg (ref 26.0–34.0)
MCHC: 31.8 g/dL (ref 30.0–36.0)
MCV: 93.3 fL (ref 78.0–100.0)
PLATELETS: 149 10*3/uL — AB (ref 150–400)
RBC: 4.01 MIL/uL — AB (ref 4.22–5.81)
RDW: 13.9 % (ref 11.5–15.5)
WBC: 4.4 10*3/uL (ref 4.0–10.5)

## 2015-10-23 LAB — CARBAMAZEPINE LEVEL, TOTAL: CARBAMAZEPINE LVL: 7.4 ug/mL (ref 4.0–12.0)

## 2015-10-23 NOTE — Progress Notes (Signed)
Speech Language Pathology Treatment: Dysphagia  Patient Details Name: Erik Massey MRN: [id number] DOB: 26-May-1929 Today's Date: 10/23/2015 Time: [id number] SLP Time Calculation (min) (ACUTE ONLY): 9 min  Assessment / Plan / Recommendation Clinical Impression  Pt sitting upright in chair upon SLP arrival.  Observed him consuming water via straw (x 4 ounces) and 2 small bites of graham cracker.  Pt continues with impulsivity = which per chart review is his baseline- ? Due to prior TBI with exacerbation.  Wife not present during today's session.  Pt continues with consumption of large sequential boluses of water - follow by belching and delayed cough - ? UES deficit and/or reflux.  He did not clearly articulate if has baseline difficulty but did nod yes to question re: liquid "coming back up".  Advised pt to follow reflux precautions and take very small bites/sips.  Pt appears to be tolerating po intake but very strict aspiration/reflux precautions are recommended - including full supervision.   New precautions posted on sign above pt's bed.    HPI HPI: Erik Massey is a 81 y.o. male with a history of seizures. Patient has not had a seizure in 16 years, maintained on Tegretol. Patient had a seizure at home this a.m.. In the emergency department Tegretol level was less than 2. After stabilization plan was to discharge patient home but then he had a second seizure with tongue trauma. Per wife, patient ran out of Tegretol. Wife called for refill but it did not get approved in time and patient missed some doses.  Patient's wife provides some details about medical history : Patient has right upper extremity weakness secondary to a fall from a 3 story building in 1961.  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  His wife reported that he is implusive with his intake, always  eating very quickly.  She reported no recent respiratory issues.        SLP Plan  Continue with current plan of care     Recommendations  Diet recommendations: Regular;Thin liquid Liquids provided via: Straw;Cup Medication Administration: Crushed with puree Supervision: Staff to assist with self feeding Compensations: Minimize environmental distractions;Slow rate;Small sips/bites;Follow solids with liquid Postural Changes and/or Swallow Maneuvers: Seated upright 90 degrees;Upright 30-60 min after meal              Oral Care Recommendations: Oral care BID Follow up Recommendations: Skilled Nursing facility Plan: Continue with current plan of care  Luanna Salk, [facility] SLP [phone]

## 2015-10-23 NOTE — Progress Notes (Signed)
Physical Therapy Treatment Patient Details Name: Erik Massey MRN: [id number] DOB: 1929/10/24 Today's Date: 10/23/2015    History of Present Illness Erik Massey is a 81 y.o. male admitted due to seizures. Patient has not had a seizure in 16 years. +UTI PMHx- Alzheimers, RUE weakness/contracture secondary to a fall from a 3 story building in 1961; implusivity s/p TBI     PT Comments    Patient calm and able to attend to visual cues/commands for mobility. Remains very unsteady and required second person to follow closely with chair.   Follow Up Recommendations  SNF     Equipment Recommendations  None recommended by PT    Recommendations for Other Services       Precautions / Restrictions Precautions Precautions: Fall Restrictions Weight Bearing Restrictions: No    Mobility  Bed Mobility Overal bed mobility: Needs Assistance Bed Mobility: Supine to Sit     Supine to sit: Min guard     General bed mobility comments: Pt able to maneuver up without rail and scoot to EOB with minguard  Transfers Overall transfer level: Needs assistance Equipment used: 1 person hand held assist Transfers: Sit to/from Stand Sit to Stand: Mod assist         General transfer comment: visual cues and pt initiated; x 2 with posterior lean each time  Ambulation/Gait Ambulation/Gait assistance: Mod assist;+2 safety/equipment Ambulation Distance (Feet): 25 Feet Assistive device: 1 person hand held assist Gait Pattern/deviations: Step-to pattern;Decreased stride length;Decreased dorsiflexion - right;Steppage (Rt knee hyperextension)   Gait velocity interpretation: Below normal speed for age/gender     Stairs            Wheelchair Mobility    Modified Rankin (Stroke Patients Only)       Balance   Sitting-balance support: No upper extremity supported;Feet unsupported Sitting balance-Leahy Scale: Fair     Standing balance support: Single extremity  supported Standing balance-Leahy Scale: Poor                      Cognition Arousal/Alertness: Awake/alert Behavior During Therapy: WFL for tasks assessed/performed Overall Cognitive Status: Difficult to assess                      Exercises      General Comments General comments (skin integrity, edema, etc.): Following visual cues briskly; more alert      Pertinent Vitals/Pain Pain Assessment: Faces Faces Pain Scale: No hurt    Home Living                      Prior Function            PT Goals (current goals can now be found in the care plan section) Acute Rehab PT Goals Patient Stated Goal: none stated (garbled speech) Time For Goal Achievement: 11/04/15 Progress towards PT goals: Progressing toward goals    Frequency  Min 2X/week    PT Plan Current plan remains appropriate;Frequency needs to be updated    Co-evaluation             End of Session Equipment Utilized During Treatment: Gait belt Activity Tolerance: Patient tolerated treatment well Patient left: in chair;with call bell/phone within reach;with chair alarm set     Time: 1002-1018 PT Time Calculation (min) (ACUTE ONLY): 16 min  Charges:  $Gait Training: 8-22 mins  G Codes:      Laniece Hornbaker 10/23/2015, 10:45 AM Pager [phone]

## 2015-10-23 NOTE — Clinical Social Work Note (Signed)
CSW presented bed offers to patient and his wife Estill Batten, patient's wife chose [facility] for SNF placement.  CSW contacted [facility] who confirmed they can take patient once he is medically ready for discharge and orders have been received.  [facility] will contact patient's family to arrange time to complete admission paperwork.  Jones Broom. [place], MSW, [place] 10/23/2015 5:40 PM

## 2015-10-23 NOTE — Progress Notes (Signed)
PROGRESS NOTE    Erik Massey [id number] DOB: 11-27-1928 DOA: 10/20/2015 PCP: Maximino Greenland, MD  HPI/Brief narrative 81 year old male with history of seizures, had not had seizures since 16 years, maintained on Tegretol, presented with seizure at home on day of admission. In the ED Tegretol level was less than 2. After stabilization in ED, plan was to discharge patient home but he had a second seizure with tongue trauma. Patient apparently ran out of Tegretol. Chronic right upper extremity weakness secondary to fall several years ago.  Assessment/Plan:  Seizure disorder - Patient had 2 seizures with oral trauma on the morning of admission after being off Tegretol (ran out of medications) - Initial Tegretol level less than 2. - Neurology consulted. Seizures likely due to combination of not taking Tegretol for 2-3 days and UTI. - Started back on Tegretol. - Repeat Tegretol level 6.5>7.4  Hypothyroid - TSH: 12.4. Free T4: 0.6 - As per records, patient was on Synthroid 50 g daily and had not taken it for a month. Restarted in the hospital. Repeat TSH in the 7 range. Follow TSH as outpatient in 4-6 weeks.  UTI - Patient empirically placed on IV Rocephin. Unfortunately no cultures were sent. Change to oral Ceftin at discharge to complete total one-week off antibiotics.  Dysphagia - Speech therapy evaluated and recommended a regular diet and thin liquids.  Falls at home - Patient will be discharged to SNF.  Pancytopenia - stable. OP follow up  Hypokalemia - Replaced.   DVT prophylaxis: Lovenox Code Status: DNR Family Communication: Discussed with patient's daughter [name] on 12/14. Updated care and answered questions Disposition Plan: DC to SNF possibly 10/24/2015   Consultants:  Neurology  Palliative Care Medicine  Procedures:  None at bedside  Antibiotics:  IV Rocephin 12/12 >  Subjective: Denies complaints.  Objective: Filed  Vitals:   10/23/15 0210 10/23/15 0452 10/23/15 1037 10/23/15 1446  BP: 122/69 143/73 127/60 128/67  Pulse: 62 62 66 66  Temp: 98.3 F (36.8 C) 98.6 F (37 C) 99 F (37.2 C) 98.7 F (37.1 C)  TempSrc: Oral Oral Oral Oral  Resp: 20 20 17 15   SpO2: 99% 99% 95% 100%    Intake/Output Summary (Last 24 hours) at 10/23/15 1740 Last data filed at 10/23/15 1254  Gross per 24 hour  Intake    200 ml  Output      0 ml  Net    200 ml   There were no vitals filed for this visit.   Exam:  General exam: Pleasant elderly male lying comfortably supine in bed. Respiratory system: Clear. No increased work of breathing. Cardiovascular system: S1 & S2 heard, RRR. No JVD, murmurs, gallops, clicks or pedal edema. Telemetry: Sinus rhythm. Gastrointestinal system: Abdomen is nondistended, soft and nontender. Normal bowel sounds heard. Central nervous system: Alert and oriented to self and partly to place. No focal neurological deficits. Extremities: Contractures of right upper extremity and limited movement-able to slightly raise arm off the bed. Contractures of left hand/fingers. Grade 3 x 5 power at least in the other 3 limbs.   Data Reviewed: Basic Metabolic Panel:  Recent Labs Lab 10/20/15 0409 10/21/15 1000 10/22/15 0517  NA 141 142 141  K 4.1 3.2* 3.8  CL 109 109 113*  CO2 22 27 21*  GLUCOSE 83 77 77  BUN 26* 21* 19  CREATININE 1.14 1.16 1.08  CALCIUM 8.7* 8.4* 8.0*   Liver Function Tests:  Recent Labs Lab 10/21/15 1000  AST 32  ALT 20  ALKPHOS 131*  BILITOT 0.7  PROT 5.3*  ALBUMIN 2.8*   No results for input(s): LIPASE, AMYLASE in the last 168 hours.  Recent Labs Lab 10/21/15 1000  AMMONIA 34   CBC:  Recent Labs Lab 10/21/15 1000 10/22/15 0517 10/23/15 0528  WBC 5.0 3.5* 4.4  HGB 10.5* 10.8* 11.9*  HCT 32.9* 33.2* 37.4*  MCV 92.4 91.5 93.3  PLT 127* 142* 149*   Cardiac Enzymes: No results for input(s): CKTOTAL, CKMB, CKMBINDEX, TROPONINI in the last 168  hours. BNP (last 3 results) No results for input(s): PROBNP in the last 8760 hours. CBG:  Recent Labs Lab 10/20/15 0259  GLUCAP 84    No results found for this or any previous visit (from the past 240 hour(s)).        Studies: No results found.      Scheduled Meds: . betaxolol  1 drop Right Eye Daily  . carbamazepine  400 mg Oral q12n4p  . cefTRIAXone (ROCEPHIN)  IV  1 g Intravenous Q24H  . dorzolamide  1 drop Right Eye Q12H  . enoxaparin (LOVENOX) injection  40 mg Subcutaneous Q24H  . finasteride  5 mg Oral Daily  . latanoprost  1 drop Right Eye QHS  . levothyroxine  50 mcg Oral QAC breakfast  . LORazepam  1 mg Intravenous Once  . mirabegron ER  50 mg Oral QPM  . rivastigmine  4.6 mg Transdermal Q24H  . tamsulosin  0.4 mg Oral Daily   Continuous Infusions:    Principal Problem:   Seizure ([facility]) Active Problems:   UTI (lower urinary tract infection)   Falls   Cough   Physical deconditioning   BPH (benign prostatic hyperplasia)    Time spent: 25 minutes.    Vernell Leep, MD, FACP, FHM. [facility] Pager [phone]  If 7PM-7AM, please contact night-coverage [email or web address] Password [email or web address] 10/23/2015, 5:40 PM    LOS: 2 days

## 2015-10-24 DIAGNOSIS — E46 Unspecified protein-calorie malnutrition: Secondary | ICD-10-CM | POA: Diagnosis not present

## 2015-10-24 DIAGNOSIS — N390 Urinary tract infection, site not specified: Secondary | ICD-10-CM | POA: Diagnosis not present

## 2015-10-24 DIAGNOSIS — N3281 Overactive bladder: Secondary | ICD-10-CM | POA: Diagnosis not present

## 2015-10-24 DIAGNOSIS — N401 Enlarged prostate with lower urinary tract symptoms: Secondary | ICD-10-CM | POA: Diagnosis not present

## 2015-10-24 DIAGNOSIS — G40901 Epilepsy, unspecified, not intractable, with status epilepticus: Secondary | ICD-10-CM | POA: Diagnosis not present

## 2015-10-24 DIAGNOSIS — G40909 Epilepsy, unspecified, not intractable, without status epilepticus: Secondary | ICD-10-CM | POA: Diagnosis not present

## 2015-10-24 DIAGNOSIS — R1310 Dysphagia, unspecified: Secondary | ICD-10-CM | POA: Diagnosis not present

## 2015-10-24 DIAGNOSIS — R278 Other lack of coordination: Secondary | ICD-10-CM | POA: Diagnosis not present

## 2015-10-24 DIAGNOSIS — R489 Unspecified symbolic dysfunctions: Secondary | ICD-10-CM | POA: Diagnosis not present

## 2015-10-24 DIAGNOSIS — E039 Hypothyroidism, unspecified: Secondary | ICD-10-CM | POA: Diagnosis not present

## 2015-10-24 DIAGNOSIS — Z5189 Encounter for other specified aftercare: Secondary | ICD-10-CM | POA: Diagnosis not present

## 2015-10-24 DIAGNOSIS — R1312 Dysphagia, oropharyngeal phase: Secondary | ICD-10-CM | POA: Diagnosis not present

## 2015-10-24 DIAGNOSIS — M6281 Muscle weakness (generalized): Secondary | ICD-10-CM | POA: Diagnosis not present

## 2015-10-24 DIAGNOSIS — D638 Anemia in other chronic diseases classified elsewhere: Secondary | ICD-10-CM | POA: Diagnosis not present

## 2015-10-24 DIAGNOSIS — R2681 Unsteadiness on feet: Secondary | ICD-10-CM | POA: Diagnosis not present

## 2015-10-24 DIAGNOSIS — G309 Alzheimer's disease, unspecified: Secondary | ICD-10-CM | POA: Diagnosis not present

## 2015-10-24 DIAGNOSIS — K5900 Constipation, unspecified: Secondary | ICD-10-CM | POA: Diagnosis not present

## 2015-10-24 DIAGNOSIS — N400 Enlarged prostate without lower urinary tract symptoms: Secondary | ICD-10-CM | POA: Diagnosis not present

## 2015-10-24 DIAGNOSIS — R5381 Other malaise: Secondary | ICD-10-CM | POA: Diagnosis not present

## 2015-10-24 DIAGNOSIS — F0280 Dementia in other diseases classified elsewhere without behavioral disturbance: Secondary | ICD-10-CM | POA: Diagnosis not present

## 2015-10-24 DIAGNOSIS — Z9181 History of falling: Secondary | ICD-10-CM | POA: Diagnosis not present

## 2015-10-24 DIAGNOSIS — R569 Unspecified convulsions: Secondary | ICD-10-CM | POA: Diagnosis not present

## 2015-10-24 MED ORDER — CEFUROXIME AXETIL 250 MG PO TABS: 250.0000 mg | ORAL_TABLET | Freq: Two times a day (BID) | ORAL | Status: DC

## 2015-10-24 NOTE — Care Management Important Message (Signed)
Important Message  Patient Details  Name: Erik Massey MRN: [id number] Date of Birth: 02-02-1929   Medicare Important Message Given:  Yes    [name] [name] 10/24/2015, 2:34 PM

## 2015-10-24 NOTE — Discharge Summary (Signed)
Physician Discharge Summary  Erik Massey [id number] DOB: 1928/12/26 DOA: 10/20/2015  PCP: Maximino Greenland, MD  Admit date: 10/20/2015 Discharge date: 10/24/2015  Time spent: Greater than 30 minutes  Recommendations for Outpatient Follow-up:  1. M.D. at SNF in 5-7 days with repeat labs (CBC, BMP & Tegretol levels). Recommend repeating TSH in 4-6 weeks. 2. Dr. Glendale Chard, PCP upon discharge from SNF.  Discharge Diagnoses:  Principal Problem:   Seizure ([facility]) Active Problems:   UTI (lower urinary tract infection)   Falls   Cough   Physical deconditioning   BPH (benign prostatic hyperplasia)   Discharge Condition: Improved & Stable  Diet recommendation: Heart healthy diet.  There were no vitals filed for this visit.  History of present illness:  81 year old male with history of Alzheimer's disease, hypothyroidism & seizures, had not had seizures since 16 years, maintained on Tegretol, presented with seizure at home on day of admission. In the ED Tegretol level was less than 2. After stabilization in ED, plan was to discharge patient home but he had a second seizure with tongue trauma. Patient apparently ran out of Tegretol. Chronic right upper extremity weakness secondary to fall several years ago.  Hospital Course:   Seizure disorder - Patient had 2 seizures with oral trauma on the morning of admission after being off Tegretol (ran out of medications) - Initial Tegretol level less than 2. - Neurology consulted. Seizures likely due to combination of not taking Tegretol for 2-3 days and UTI. - Started back on Tegretol. - Repeat Tegretol level 6.5>7.4 - No further seizures reported. Continue prior home dose of Tegretol and follow-up levels in a few days.  Hypothyroid - TSH: 12.4. Free T4: 0.6 - As per records, patient was on Synthroid 50 g daily and had not taken it for a month. Restarted in the hospital. Repeat TSH in the 7 range. Follow TSH as outpatient in 4-6  weeks.  UTI - Patient empirically placed on IV Rocephin. Unfortunately no cultures were sent. Change to oral Ceftin at discharge to complete total one-week off antibiotics. Completed 5 days of IV Rocephin today. Continue oral Ceftin and DC after 12/18 doses.  Dysphagia - Speech therapy evaluated and recommended a regular diet and thin liquids.  Falls at home - Patient will be discharged to SNF.  Pancytopenia - stable. OP follow up  Hypokalemia - Replaced.  Alzheimer's disease - Mental status likely at baseline. Continue home medications  DO NOT RESUSCITATE - Palliative care team met with patient and family on 10/21/15: DO NOT RESUSCITATE confirmed, MOST form completed, limited interventions i.e. IV fluids and antibiotics if indicated, time-limited trial of feeding tube if needed and rehabilitation at SNF.  I discussed with patient's daughter [name] a couple days ago and she was agreeable to above plan of care.   Consultants:  Neurology  Palliative Care Medicine  Procedures:  None at bedside  Antibiotics:  IV Rocephin 12/12 > 12/16  Oral Ceftin 12/17 > 12/18   Discharge Exam:  Complaints: Denies complaints. As per RN at bedside, no acute events.  Filed Vitals:   10/23/15 2138 10/24/15 0223 10/24/15 0518 10/24/15 0954  BP: 129/61 130/74 135/76 135/51  Pulse: 72 55 57 58  Temp: 98 F (36.7 C) 98 F (36.7 C) 97.9 F (36.6 C) 98.6 F (37 C)  TempSrc: Oral Oral Oral Oral  Resp: 20 20 20 17   SpO2: 100% 98% 99% 98%    General exam: Pleasant elderly male lying comfortably supine in  bed. Respiratory system: Clear. No increased work of breathing. Cardiovascular system: S1 & S2 heard, RRR. No JVD, murmurs, gallops, clicks or pedal edema. Telemetry: Sinus rhythm. Gastrointestinal system: Abdomen is nondistended, soft and nontender. Normal bowel sounds heard. Central nervous system: Alert and oriented to self and place. No focal neurological  deficits. Extremities: Contractures of right upper extremity and limited movement-able to slightly raise arm off the bed. Contractures of left hand/fingers. Grade 3 x 5 power at least in the other 3 limbs.  Discharge Instructions      Discharge Instructions    Call MD for:  difficulty breathing, headache or visual disturbances    Complete by:  As directed      Call MD for:  extreme fatigue    Complete by:  As directed      Call MD for:  persistant dizziness or light-headedness    Complete by:  As directed      Call MD for:  persistant nausea and vomiting    Complete by:  As directed      Call MD for:  severe uncontrolled pain    Complete by:  As directed      Call MD for:  temperature >100.4    Complete by:  As directed      Diet - low sodium heart healthy    Complete by:  As directed      Increase activity slowly    Complete by:  As directed             Medication List    TAKE these medications        BETOPTIC-S 0.25 % ophthalmic suspension  Generic drug:  betaxolol  Place 1 drop into the right eye daily.     carbamazepine 400 MG 12 hr tablet  Commonly known as:  TEGRETOL XR  Take 1 tablet (400 mg total) by mouth 2 (two) times daily.     cefUROXime 250 MG tablet  Commonly known as:  CEFTIN  Take 1 tablet (250 mg total) by mouth 2 (two) times daily with a meal. Take for 2 days on 10/25/15 & 10/26/15, then discontinue.     cholecalciferol 1000 UNITS tablet  Commonly known as:  VITAMIN D  Take 2,000 Units by mouth daily.     dorzolamide 2 % ophthalmic solution  Commonly known as:  TRUSOPT  Place 1 drop into the right eye every 12 (twelve) hours.     EXELON 4.6 mg/24hr  Generic drug:  rivastigmine  Place 4.6 mg onto the skin daily. Take one off and put the other on     finasteride 5 MG tablet  Commonly known as:  PROSCAR  Take 5 mg by mouth daily.     latanoprost 0.005 % ophthalmic solution  Commonly known as:  XALATAN  Place 1 drop into the right eye at  bedtime.     MYRBETRIQ 50 MG Tb24 tablet  Generic drug:  mirabegron ER  Take 50 mg by mouth every evening.     OVER THE COUNTER MEDICATION  Take 1 capsule by mouth 2 (two) times daily. Prostate plus     senna-docusate 8.6-50 MG tablet  Commonly known as:  Senokot-S  Take 1 tablet by mouth 2 (two) times daily.     SYNTHROID 50 MCG tablet  Generic drug:  levothyroxine  Take 50 mcg by mouth daily before breakfast.     tamsulosin 0.4 MG Caps capsule  Commonly known as:  FLOMAX  Take 0.8 mg  by mouth daily after supper.       Follow-up Information    Follow up with Maximino Greenland, MD.   Specialty:  Internal Medicine   Contact information:   [address] [place] [address] [phone]       Follow up with [facility] .   Specialty:  Skilled Nursing Facility   Contact information:   [place] [phone]       The results of significant diagnostics from this hospitalization (including imaging, microbiology, ancillary and laboratory) are listed below for reference.    Significant Diagnostic Studies: Dg Tibia/fibula Left  09/26/2015  CLINICAL DATA:  Fall. Swelling noted to distal left tib/fib. Pt has bilateral knee and leg pain after falling on oct 13. Pain is getting progressively worse and is affecting his mobility. Pt. has been falling more frequently since. Hx alzheimer's. EXAM: LEFT TIBIA AND FIBULA - 2 VIEW COMPARISON:  None. FINDINGS: No fracture.  No bone lesion. Knee and ankle joints are normally aligned. There are no significant arthropathic changes. Mild subcutaneous soft tissue edema is noted most evident at the ankle. IMPRESSION: 1. No fracture or acute finding. 2. No bone lesion. 3. Mild soft tissue edema. Electronically Signed   By: Lajean Manes M.D.   On: 09/26/2015 16:40   Ct Head Wo Contrast  09/26/2015  CLINICAL DATA:  Unwitnessed fall, Alzheimer's disease EXAM: CT HEAD WITHOUT CONTRAST CT  CERVICAL SPINE WITHOUT CONTRAST TECHNIQUE: Multidetector CT imaging of the head and cervical spine was performed following the standard protocol without intravenous contrast. Multiplanar CT image reconstructions of the cervical spine were also generated. COMPARISON:  MRI brain dated 02/01/2012. FINDINGS: CT HEAD FINDINGS No evidence of parenchymal hemorrhage or extra-axial fluid collection. No mass lesion, mass effect, or midline shift. No CT evidence of acute infarction. Encephalomalacic changes involving the bilateral frontal lobes and left parietal lobe. Overlying postsurgical changes. Extensive subcortical white matter and periventricular small vessel ischemic changes. Global cortical and central atrophy.  Secondary ventriculomegaly. Postsurgical changes involving the frontal and anterior ethmoid sinuses. Bilateral mastoid air cells are clear. No evidence of calvarial fracture. CT CERVICAL SPINE FINDINGS Normal cervical lordosis. No evidence of fracture or dislocation. Vertebral body heights are maintained. Dens appears intact. No prevertebral soft tissue swelling. Moderate multilevel degenerative changes. Visualized thyroid is unremarkable. Visualized lung apices are notable for mild centrilobular and paraseptal emphysematous changes and mild right upper lobe scarring. IMPRESSION: No evidence of acute intracranial abnormality. Encephalomalacic/postsurgical changes involving the bilateral upper lobes and left parietal lobe. Extensive small vessel ischemic changes. Atrophy with secondary ventriculomegaly. No evidence of traumatic injury to the cervical spine. Moderate multilevel degenerative changes. Electronically Signed   By: Julian Hy M.D.   On: 09/26/2015 16:38   Ct Cervical Spine Wo Contrast  09/26/2015  CLINICAL DATA:  Unwitnessed fall, Alzheimer's disease EXAM: CT HEAD WITHOUT CONTRAST CT CERVICAL SPINE WITHOUT CONTRAST TECHNIQUE: Multidetector CT imaging of the head and cervical spine was  performed following the standard protocol without intravenous contrast. Multiplanar CT image reconstructions of the cervical spine were also generated. COMPARISON:  MRI brain dated 02/01/2012. FINDINGS: CT HEAD FINDINGS No evidence of parenchymal hemorrhage or extra-axial fluid collection. No mass lesion, mass effect, or midline shift. No CT evidence of acute infarction. Encephalomalacic changes involving the bilateral frontal lobes and left parietal lobe. Overlying postsurgical changes. Extensive subcortical white matter and periventricular small vessel ischemic changes. Global cortical and central atrophy.  Secondary ventriculomegaly. Postsurgical  changes involving the frontal and anterior ethmoid sinuses. Bilateral mastoid air cells are clear. No evidence of calvarial fracture. CT CERVICAL SPINE FINDINGS Normal cervical lordosis. No evidence of fracture or dislocation. Vertebral body heights are maintained. Dens appears intact. No prevertebral soft tissue swelling. Moderate multilevel degenerative changes. Visualized thyroid is unremarkable. Visualized lung apices are notable for mild centrilobular and paraseptal emphysematous changes and mild right upper lobe scarring. IMPRESSION: No evidence of acute intracranial abnormality. Encephalomalacic/postsurgical changes involving the bilateral upper lobes and left parietal lobe. Extensive small vessel ischemic changes. Atrophy with secondary ventriculomegaly. No evidence of traumatic injury to the cervical spine. Moderate multilevel degenerative changes. Electronically Signed   By: Julian Hy M.D.   On: 09/26/2015 16:38    Microbiology: No results found for this or any previous visit (from the past 240 hour(s)).   Labs: Basic Metabolic Panel:  Recent Labs Lab 10/20/15 0409 10/21/15 1000 10/22/15 0517  NA 141 142 141  K 4.1 3.2* 3.8  CL 109 109 113*  CO2 22 27 21*  GLUCOSE 83 77 77  BUN 26* 21* 19  CREATININE 1.14 1.16 1.08  CALCIUM 8.7*  8.4* 8.0*   Liver Function Tests:  Recent Labs Lab 10/21/15 1000  AST 32  ALT 20  ALKPHOS 131*  BILITOT 0.7  PROT 5.3*  ALBUMIN 2.8*   No results for input(s): LIPASE, AMYLASE in the last 168 hours.  Recent Labs Lab 10/21/15 1000  AMMONIA 34   CBC:  Recent Labs Lab 10/21/15 1000 10/22/15 0517 10/23/15 0528  WBC 5.0 3.5* 4.4  HGB 10.5* 10.8* 11.9*  HCT 32.9* 33.2* 37.4*  MCV 92.4 91.5 93.3  PLT 127* 142* 149*   Cardiac Enzymes: No results for input(s): CKTOTAL, CKMB, CKMBINDEX, TROPONINI in the last 168 hours. BNP: BNP (last 3 results) No results for input(s): BNP in the last 8760 hours.  ProBNP (last 3 results) No results for input(s): PROBNP in the last 8760 hours.  CBG:  Recent Labs Lab 10/20/15 0259  GLUCAP 55        Signed:  Vernell Leep, MD, FACP, FHM. [facility] Pager [phone]  If 7PM-7AM, please contact night-coverage [email or web address] Password [facility] 10/24/2015, 12:47 PM

## 2015-10-24 NOTE — Progress Notes (Addendum)
Patient is being transferred to [facility] report called to facility IV removed, patient is stable, [facility] will transport.

## 2015-10-24 NOTE — Clinical Social Work Note (Signed)
Clinical Social Worker facilitated patient discharge including contacting patient family and facility to confirm patient discharge plans.  Clinical information faxed to facility and family agreeable with plan.  CSW arranged ambulance transport via [facility] to [facility].  RN to call report prior to discharge.  Clinical Social Worker will sign off for now as social work intervention is no longer needed. Please consult us again if new need arises.  Jesse Payden Docter, LCSW [phone] 

## 2015-10-24 NOTE — Clinical Social Work Placement (Signed)
   CLINICAL SOCIAL WORK PLACEMENT  NOTE  Date:  10/24/2015  Patient Details  Name: Erik Massey MRN: [id number] Date of Birth: 1929/06/23  Clinical Social Work is seeking post-discharge placement for this patient at the [place] level of care (*CSW will initial, date and re-position this form in  chart as items are completed):  Yes   Patient/family provided with [facility]'s list of facilities offering this level of care within the geographic area requested by the patient (or if unable, by the patient's family).  Yes   Patient/family informed of their freedom to choose among providers that offer the needed level of care, that participate in Medicare, Medicaid or managed care program needed by the patient, have an available bed and are willing to accept the patient.  Yes   Patient/family informed of [facility]'s ownership interest in [facility] and [facility], as well as of the fact that they are under no obligation to receive care at these facilities.  PASRR submitted to EDS on 10/22/15     PASRR number received on 10/22/15     Existing PASRR number confirmed on       FL2 transmitted to all facilities in geographic area requested by pt/family on 10/22/15     FL2 transmitted to all facilities within larger geographic area on       Patient informed that his/her managed care company has contracts with or will negotiate with certain facilities, including the following:        Yes   Patient/family informed of bed offers received.  Patient chooses bed at [facility]     Physician recommends and patient chooses bed at      Patient to be transferred to [facility] on 10/24/15.  Patient to be transferred to facility by Ambulance     Patient family notified on 10/24/15 of transfer.  Name of family member notified:  Patient wife at bedside     PHYSICIAN Please sign FL2, Please sign DNR     Additional Comment:     Barbette Or, [place]

## 2015-10-27 ENCOUNTER — Encounter: Payer: Self-pay | Admitting: Internal Medicine

## 2015-10-27 ENCOUNTER — Non-Acute Institutional Stay (SKILLED_NURSING_FACILITY): Payer: Medicare Other | Admitting: Internal Medicine

## 2015-10-27 DIAGNOSIS — E039 Hypothyroidism, unspecified: Secondary | ICD-10-CM

## 2015-10-27 DIAGNOSIS — N390 Urinary tract infection, site not specified: Secondary | ICD-10-CM | POA: Diagnosis not present

## 2015-10-27 DIAGNOSIS — R569 Unspecified convulsions: Secondary | ICD-10-CM | POA: Diagnosis not present

## 2015-10-27 DIAGNOSIS — G309 Alzheimer's disease, unspecified: Secondary | ICD-10-CM

## 2015-10-27 DIAGNOSIS — D638 Anemia in other chronic diseases classified elsewhere: Secondary | ICD-10-CM

## 2015-10-27 DIAGNOSIS — F0280 Dementia in other diseases classified elsewhere without behavioral disturbance: Secondary | ICD-10-CM

## 2015-10-27 DIAGNOSIS — E46 Unspecified protein-calorie malnutrition: Secondary | ICD-10-CM

## 2015-10-27 DIAGNOSIS — N400 Enlarged prostate without lower urinary tract symptoms: Secondary | ICD-10-CM

## 2015-10-27 DIAGNOSIS — R5381 Other malaise: Secondary | ICD-10-CM | POA: Diagnosis not present

## 2015-10-27 DIAGNOSIS — R1310 Dysphagia, unspecified: Secondary | ICD-10-CM

## 2015-10-27 DIAGNOSIS — R2681 Unsteadiness on feet: Secondary | ICD-10-CM | POA: Diagnosis not present

## 2015-10-27 DIAGNOSIS — K5900 Constipation, unspecified: Secondary | ICD-10-CM

## 2015-10-27 DIAGNOSIS — N3281 Overactive bladder: Secondary | ICD-10-CM

## 2015-10-27 NOTE — Progress Notes (Signed)
Patient ID: Erik Massey, male   DOB: Oct 29, 1929, 81 y.o.   MRN: [id number]     [place]  PCP: Maximino Greenland, MD  Code Status: DNR  Allergies  Allergen Reactions  . Penicillins Swelling    Has patient had a PCN reaction causing immediate rash, facial/tongue/throat swelling, SOB or lightheadedness with hypotension: yes Has patient had a PCN reaction causing severe rash involving mucus membranes or skin necrosis: no Has patient had a PCN reaction that required hospitalization- yes already in the hospital Has patient had a PCN reaction occurring within the last 10 years: No If all of the above answers are "NO", then may proceed with Cephalosporin use.     Chief Complaint  Patient presents with  . New Admit To SNF    New Admission      HPI:  81 y.o. patient is here for short term rehabilitation post hospital admission from 10/20/15-10/24/15 with seizure and UTI. His tegretol level was low on admission. He was seen by neurology and started back on tegretol. He was also treated with antibiotics and completed his ceftin course yesterday. He has PMH of alzhimer's disease, hypothyroidism and seizures. He is seen in his room today. He participates some in HPI and ROS. He is hard of hearing and has weakness with spasticity to RUE and RLE post TBI in past. He denies any concerns this visit.   Review of Systems:  Constitutional: Negative for fever, chills.  HENT: Negative for headache, congestion, nasal discharge, difficulty swallowing.   Eyes: legally blind  Respiratory: Negative for cough, shortness of breath and wheezing.   Cardiovascular: Negative for chest pain, palpitations.  Gastrointestinal: Negative for heartburn, nausea, vomiting, abdominal pain. Genitourinary: Negative for dysuria. Musculoskeletal: Negative for back pain, falls. Skin: Negative for itching, rash.  Neurological: Negative for dizziness. Psychiatric/Behavioral: has memory loss.    Past  Medical History  Diagnosis Date  . Alzheimer disease   . Seizures [facility])    Past Surgical History  Procedure Laterality Date  . Knee surgery     Social History:   reports that he has never smoked. He has never used smokeless tobacco. He reports that he does not drink alcohol. His drug history is not on file.  Family History  Problem Relation Age of Onset  . Family history unknown: Yes    Medications:   Medication List       This list is accurate as of: 10/27/15 11:23 AM.  Always use your most recent med list.               BETOPTIC-S 0.25 % ophthalmic suspension  Generic drug:  betaxolol  Place 1 drop into the right eye daily.     carbamazepine 400 MG 12 hr tablet  Commonly known as:  TEGRETOL XR  Take 1 tablet (400 mg total) by mouth 2 (two) times daily.     dorzolamide 2 % ophthalmic solution  Commonly known as:  TRUSOPT  Place 1 drop into the right eye every 12 (twelve) hours.     EXELON 4.6 mg/24hr  Generic drug:  rivastigmine  Place 4.6 mg onto the skin daily. Take one off and put the other on     finasteride 5 MG tablet  Commonly known as:  PROSCAR  Take 5 mg by mouth daily.     latanoprost 0.005 % ophthalmic solution  Commonly known as:  XALATAN  Place 1 drop into the right eye at bedtime.  MYRBETRIQ 50 MG Tb24 tablet  Generic drug:  mirabegron ER  Take 50 mg by mouth every evening.     senna-docusate 8.6-50 MG tablet  Commonly known as:  Senokot-S  Take 1 tablet by mouth 2 (two) times daily.     SYNTHROID 50 MCG tablet  Generic drug:  levothyroxine  Take 50 mcg by mouth daily before breakfast.     tamsulosin 0.4 MG Caps capsule  Commonly known as:  FLOMAX  Take 0.8 mg by mouth daily after supper.     vitamin B-12 1000 MCG tablet  Commonly known as:  CYANOCOBALAMIN  Take 2,000 mcg by mouth daily.         Physical Exam: Filed Vitals:   10/27/15 1117  BP: 105/57  Pulse: 89  Temp: 98.7 F (37.1 C)  TempSrc: Oral  Resp: 18    SpO2: 96%    General- elderly male, thin built, in no acute distress Head- normocephalic, atraumatic Nose- normal nasal mucosa, no maxillary or frontal sinus tenderness, no nasal discharge Throat- moist mucus membrane Eyes- no pallor, no icterus, no discharge Neck- no cervical lymphadenopathy Cardiovascular- normal s1,s2, no murmurs, palpable dorsalis pedis and radial pulses, no leg edema Respiratory- bilateral clear to auscultation, no wheeze, no rhonchi, no crackles, no use of accessory muscles Abdomen- bowel sounds present, soft, non tender Musculoskeletal- generalized weakness, RUE and RLE weakness 3/5 with muscle spasticity and contracture to RUE, unsteady gait and high fall risk Neurological- no focal deficit, alert and oriented to person Skin- warm and dry Psychiatry- normal mood and affect    Labs reviewed: Basic Metabolic Panel:  Recent Labs  10/20/15 0409 10/21/15 1000 10/22/15 0517  NA 141 142 141  K 4.1 3.2* 3.8  CL 109 109 113*  CO2 22 27 21*  GLUCOSE 83 77 77  BUN 26* 21* 19  CREATININE 1.14 1.16 1.08  CALCIUM 8.7* 8.4* 8.0*   Liver Function Tests:  Recent Labs  10/21/15 1000  AST 32  ALT 20  ALKPHOS 131*  BILITOT 0.7  PROT 5.3*  ALBUMIN 2.8*   No results for input(s): LIPASE, AMYLASE in the last 8760 hours.  Recent Labs  10/21/15 1000  AMMONIA 34   CBC:  Recent Labs  10/21/15 1000 10/22/15 0517 10/23/15 0528  WBC 5.0 3.5* 4.4  HGB 10.5* 10.8* 11.9*  HCT 32.9* 33.2* 37.4*  MCV 92.4 91.5 93.3  PLT 127* 142* 149*   Cardiac Enzymes: No results for input(s): CKTOTAL, CKMB, CKMBINDEX, TROPONINI in the last 8760 hours. BNP: Invalid input(s): POCBNP CBG:  Recent Labs  09/26/15 1505 10/20/15 0259  GLUCAP 109* 84     Assessment/Plan  Physical deconditioning Will have him work with physical therapy and occupational therapy team to help with gait training and muscle strengthening exercises.fall precautions. Skin care. Encourage  to be out of bed.   Unsteady gait With right sided weakness and spasticity. Using hemiwalker with 1 person assist at present. Will need safety awareness. Fall precautions. Will have him work with physical therapy and occupational therapy team to help with gait training and muscle strengthening exercises.fall precautions. Skin care. Encourage to be out of bed.   Seizure disorder Continue tegretol xl 400 mg bid, check tegretol level on 10/29/15  UTI Currently asymptomatic, completed antibiotic course, to maintain hydration  Anemia Likely from chronic diseases, check cbc  Protein calorie malnutrition Monitor po intake. Add procel bid with meals and get dietary consult. Will get weekly weights for now  alzhimer's dementia No  behavioral disturbances noted. Continue exelon patch. Will need complete assistance with ADLs for now. Pressure ulcer prophylaxis. Fall precaution. Assistance with feed. Monitor weight. To work with SLP for cognition and PT and OT to help restore ADLs  Dysphagia Continue thin liquids and regular   , aspiration precautions, will have SLP team to follow  BPH  continue finasteride 5 mg daily and flomax 0.4 mg daily  OAB Continue myrbetriq for now  Hypothyroidism Reviewed tsh level. Continue levothyroxine 50 mcg daily  Constipation Continue senokot s and maintain hydration  Goals of care: short term rehabilitation   Labs/tests ordered: cbc, cmp, tegretol level 10/29/15  Family/ staff Communication: reviewed care plan with patient and nursing supervisor    Blanchie Serve, MD  [place] [phone] (Monday-Friday 8 am - 5 pm) [phone] (afterhours)

## 2015-11-09 DIAGNOSIS — N390 Urinary tract infection, site not specified: Secondary | ICD-10-CM | POA: Diagnosis not present

## 2015-11-09 DIAGNOSIS — R569 Unspecified convulsions: Secondary | ICD-10-CM | POA: Diagnosis not present

## 2015-11-09 DIAGNOSIS — K5900 Constipation, unspecified: Secondary | ICD-10-CM | POA: Diagnosis not present

## 2015-11-09 DIAGNOSIS — N401 Enlarged prostate with lower urinary tract symptoms: Secondary | ICD-10-CM | POA: Diagnosis not present

## 2015-11-09 DIAGNOSIS — G309 Alzheimer's disease, unspecified: Secondary | ICD-10-CM | POA: Diagnosis not present

## 2015-11-09 DIAGNOSIS — Z9181 History of falling: Secondary | ICD-10-CM | POA: Diagnosis not present

## 2015-11-09 DIAGNOSIS — N400 Enlarged prostate without lower urinary tract symptoms: Secondary | ICD-10-CM | POA: Diagnosis not present

## 2015-11-09 DIAGNOSIS — N3281 Overactive bladder: Secondary | ICD-10-CM | POA: Diagnosis not present

## 2015-11-09 DIAGNOSIS — R278 Other lack of coordination: Secondary | ICD-10-CM | POA: Diagnosis not present

## 2015-11-09 DIAGNOSIS — R5381 Other malaise: Secondary | ICD-10-CM | POA: Diagnosis not present

## 2015-11-09 DIAGNOSIS — R1310 Dysphagia, unspecified: Secondary | ICD-10-CM | POA: Diagnosis not present

## 2015-11-09 DIAGNOSIS — D638 Anemia in other chronic diseases classified elsewhere: Secondary | ICD-10-CM | POA: Diagnosis not present

## 2015-11-09 DIAGNOSIS — R1312 Dysphagia, oropharyngeal phase: Secondary | ICD-10-CM | POA: Diagnosis not present

## 2015-11-09 DIAGNOSIS — R489 Unspecified symbolic dysfunctions: Secondary | ICD-10-CM | POA: Diagnosis not present

## 2015-11-09 DIAGNOSIS — E039 Hypothyroidism, unspecified: Secondary | ICD-10-CM | POA: Diagnosis not present

## 2015-11-09 DIAGNOSIS — E46 Unspecified protein-calorie malnutrition: Secondary | ICD-10-CM | POA: Diagnosis not present

## 2015-11-09 DIAGNOSIS — M6281 Muscle weakness (generalized): Secondary | ICD-10-CM | POA: Diagnosis not present

## 2015-11-09 DIAGNOSIS — Z5189 Encounter for other specified aftercare: Secondary | ICD-10-CM | POA: Diagnosis not present

## 2015-11-09 DIAGNOSIS — F0280 Dementia in other diseases classified elsewhere without behavioral disturbance: Secondary | ICD-10-CM | POA: Diagnosis not present

## 2015-11-18 ENCOUNTER — Encounter: Payer: Self-pay | Admitting: Adult Health

## 2015-11-18 ENCOUNTER — Non-Acute Institutional Stay (SKILLED_NURSING_FACILITY): Payer: Medicare Other | Admitting: Adult Health

## 2015-11-18 DIAGNOSIS — R5381 Other malaise: Secondary | ICD-10-CM

## 2015-11-18 DIAGNOSIS — N3281 Overactive bladder: Secondary | ICD-10-CM | POA: Diagnosis not present

## 2015-11-18 DIAGNOSIS — R569 Unspecified convulsions: Secondary | ICD-10-CM | POA: Diagnosis not present

## 2015-11-18 DIAGNOSIS — G309 Alzheimer's disease, unspecified: Secondary | ICD-10-CM

## 2015-11-18 DIAGNOSIS — K5900 Constipation, unspecified: Secondary | ICD-10-CM

## 2015-11-18 DIAGNOSIS — N400 Enlarged prostate without lower urinary tract symptoms: Secondary | ICD-10-CM

## 2015-11-18 DIAGNOSIS — E039 Hypothyroidism, unspecified: Secondary | ICD-10-CM

## 2015-11-18 DIAGNOSIS — E46 Unspecified protein-calorie malnutrition: Secondary | ICD-10-CM | POA: Diagnosis not present

## 2015-11-18 DIAGNOSIS — R1310 Dysphagia, unspecified: Secondary | ICD-10-CM

## 2015-11-18 DIAGNOSIS — F0280 Dementia in other diseases classified elsewhere without behavioral disturbance: Secondary | ICD-10-CM

## 2015-11-18 DIAGNOSIS — D638 Anemia in other chronic diseases classified elsewhere: Secondary | ICD-10-CM

## 2015-11-18 DIAGNOSIS — N390 Urinary tract infection, site not specified: Secondary | ICD-10-CM | POA: Diagnosis not present

## 2015-11-18 NOTE — Progress Notes (Signed)
Patient ID: Erik Massey, male   DOB: 04/19/1929, 82 y.o.   MRN: [id number]    DATE:  11/18/2015   MRN:  [id number]  BIRTHDAY: 04-Apr-1929  Facility:  Nursing Home Location:  [place] Room Number: [id number]  LEVEL OF CARE:  SNF [phone])  Contact Information    Name [place] [phone]    [place],Dr. Joelene Millin Daughter  [phone]      Code Status History    Date Active Date Inactive Code Status Order ID Comments User Context   10/21/2015  6:42 PM 10/24/2015  5:41 PM DNR [id number]  Micheline Rough, MD Inpatient   10/20/2015  9:00 AM 10/21/2015  6:42 PM Full Code [id number]  Willia Craze, NP ED   10/20/2015  8:27 AM 10/20/2015  8:27 AM Full Code [id number]  Willia Craze, NP ED    Questions for Most Recent Historical Code Status (Order [id number])    Question Answer Comment   In the event of cardiac or respiratory ARREST Do not call a "code blue"    In the event of cardiac or respiratory ARREST Do not perform Intubation, CPR, defibrillation or ACLS    In the event of cardiac or respiratory ARREST Use medication by any route, position, wound care, and other measures to relive pain and suffering. May use oxygen, suction and manual treatment of airway obstruction as needed for comfort.        Chief Complaint  Patient presents with  . Discharge Note    Physical deconditioning, seizure disorder, UTI, anemia, protein calorie malnutrition, Alzheimer's dementia, dysphagia, BPH, hypothyroidism and constipation    HISTORY OF PRESENT ILLNESS:  This is an 82 year old male who is for discharge home with home health PT for endurance, OT for ADLs, ST for swallowing, skilled Nurse for disease management and CNA for showers. DME:  Hemi-walker. He has been admitted to [facility] on 10/24/15 from [facility]. He has PMH of Alzheimer's disease, hypothyroidism and seizures. He was  treated in the hospital for seizure and UTI. His Tegretol level was low on admission. He was seen by neurology and started back on Tegretol. He had UTI and was treated with antibiotics/septic 15     PAST MEDICAL HISTORY:  Past Medical History  Diagnosis Date  . Alzheimer disease   . Seizures ([place])      CURRENT MEDICATIONS: Reviewed  Patient's Medications  New Prescriptions   No medications on file  Previous Medications   BETOPTIC-S 0.25 % OPHTHALMIC SUSPENSION    Place 1 drop into the right eye daily.    CARBAMAZEPINE (TEGRETOL XR) 400 MG 12 HR TABLET    Take 1 tablet (400 mg total) by mouth 2 (two) times daily.   DORZOLAMIDE (TRUSOPT) 2 % OPHTHALMIC SOLUTION    Place 1 drop into the right eye every 12 (twelve) hours.    EXELON 4.6 MG/24HR    Place 4.6 mg onto the skin daily. Take one off and put the other on   FINASTERIDE (PROSCAR) 5 MG TABLET    Take 5 mg by mouth daily.    LATANOPROST (XALATAN) 0.005 % OPHTHALMIC SOLUTION    Place 1 drop into the right eye at bedtime.    MIRABEGRON ER (MYRBETRIQ) 50 MG TB24 TABLET    Take 50 mg by mouth every evening.   PROTEIN (PROCEL 100 PO)    Take 1 scoop by mouth 2 (two) times  daily.   SENNA-DOCUSATE (SENOKOT-S) 8.6-50 MG TABLET    Take 1 tablet by mouth 2 (two) times daily.   SYNTHROID 50 MCG TABLET    Take 50 mcg by mouth daily before breakfast.    TAMSULOSIN (FLOMAX) 0.4 MG CAPS CAPSULE    Take 0.8 mg by mouth daily after supper.    VITAMIN B-12 (CYANOCOBALAMIN) 1000 MCG TABLET    Take 2,000 mcg by mouth daily.  Modified Medications   No medications on file  Discontinued Medications   No medications on file     Allergies  Allergen Reactions  . Penicillins Swelling    Has patient had a PCN reaction causing immediate rash, facial/tongue/throat swelling, SOB or lightheadedness with hypotension: yes Has patient had a PCN reaction causing severe rash involving mucus membranes or skin necrosis: no Has patient had a PCN reaction that  required hospitalization- yes already in the hospital Has patient had a PCN reaction occurring within the last 10 years: No If all of the above answers are "NO", then may proceed with Cephalosporin use.      REVIEW OF SYSTEMS:  GENERAL: no change in appetite, no fatigue, no weight changes, no fever, chills or weakness SKIN: Denies rash, itching, wounds, ulcer sores, or nail abnormality EYES: Denies change in vision, dry eyes, eye pain, itching or discharge EARS: Denies change in hearing, ringing in ears, or earache NOSE: Denies nasal congestion or epistaxis MOUTH and THROAT: Denies oral discomfort, gingival pain or bleeding, pain from teeth or hoarseness   RESPIRATORY: no cough, SOB, DOE, wheezing, hemoptysis CARDIAC: no chest pain, edema or palpitations GI: no abdominal pain, diarrhea, constipation, heart burn, nausea or vomiting GU: Denies dysuria, frequency, hematuria, incontinence, or discharge PSYCHIATRIC: Denies feeling of depression or anxiety. No report of hallucinations, insomnia, paranoia, or agitation   PHYSICAL EXAMINATION  GENERAL APPEARANCE: Well nourished. In no acute distress. Normal body habitus HEAD: Normal in size and contour. No evidence of trauma EYES: Lids open and close normally. No blepharitis, entropion or ectropion. PERRL. Conjunctivae are clear and sclerae are white. Lenses are without opacity EARS: Pinnae are normal. Patient hears normal voice tunes of the examiner MOUTH and THROAT: Lips are without lesions. Oral mucosa is moist and without lesions. Tongue is normal in shape, size, and color and without lesions NECK: supple, trachea midline, no neck masses, no thyroid tenderness, no thyromegaly LYMPHATICS: no LAN in the neck, no supraclavicular LAN RESPIRATORY: breathing is even & unlabored, BS CTAB CARDIAC: RRR, no murmur,no extra heart sounds, no edema GI: abdomen soft, normal BS, no masses, no tenderness, no hepatomegaly, no splenomegaly EXTREMITIES:   RUE contracture, RUE and RLE weakness NEUROLOGY:  Slurred speech PSYCHIATRIC: Alert and oriented to person and place. Affect and behavior are appropriate  LABS/RADIOLOGY: Labs reviewed: 10/28/16  WBC 4.3 hemoglobin 11.5 hematocrit 35.8 MCV 91.8 platelet 210 sodium 142 potassium 4.0 glucose 128 BUN 22 creatinine 1.07 total protein 6.3 albumin 3.49 total bilirubin 0.27 alkaline phosphatase 171 SGOT 24 SGPT 21 3 carbamazepine 2.3 Basic Metabolic Panel:  Recent Labs  10/20/15 0409 10/21/15 1000 10/22/15 0517  NA 141 142 141  K 4.1 3.2* 3.8  CL 109 109 113*  CO2 22 27 21*  GLUCOSE 83 77 77  BUN 26* 21* 19  CREATININE 1.14 1.16 1.08  CALCIUM 8.7* 8.4* 8.0*   Liver Function Tests:  Recent Labs  10/21/15 1000  AST 32  ALT 20  ALKPHOS 131*  BILITOT 0.7  PROT 5.3*  ALBUMIN 2.8*  Recent Labs  10/21/15 1000  AMMONIA 34   CBC:  Recent Labs  10/21/15 1000 10/22/15 0517 10/23/15 0528  WBC 5.0 3.5* 4.4  HGB 10.5* 10.8* 11.9*  HCT 32.9* 33.2* 37.4*  MCV 92.4 91.5 93.3  PLT 127* 142* 149*   CBG:  Recent Labs  09/26/15 1505 10/20/15 0259  GLUCAP 109* 84     ASSESSMENT/PLAN:  Physical deconditioning - for home health PT, OT, skilled Nurse, ST and CNA  Seizure disorder -  Free carbamazepine 2.3; continue Tegretol XL 400 mg twice a day  UTI - resolved  Anemia of chronic disease - recheck hemoglobin 11.5; stable  Protein calorie malnutrition - albumin 2.8, recheck 3.49 improved; continue Procel one scoop twice a day  Alzheimer's dementia - continue Exelon 4.6 mg/24 hour 1 patch to skin daily  Dysphagia - continue regular diet and thin liquids; aspiration precaution  BPH - continue finasteride 5 mg daily and Flomax 0.4 mg 2 capsules = 0.8 mg daily  OAB - continue Myrbetriq ER 50 mg 1 tab by mouth daily   Hypothyroidism - continue levothyroxine 50 g daily  Constipation - continue Senokot S1 tab by mouth twice a day     I have filled out patient's  discharge paperwork and written prescriptions.  Patient will receive home health PT, OT, ST,skilled Nursing and CNA.  DME provided:  Hemi-walker  Total discharge time: Greater than 30 minutes  Discharge time involved coordination of the discharge process with social worker, nursing staff and therapy department. Medical justification for home health services/DME verified.    [facility], NP [employer] [phone]

## 2015-11-27 DIAGNOSIS — G309 Alzheimer's disease, unspecified: Secondary | ICD-10-CM | POA: Diagnosis not present

## 2015-11-27 DIAGNOSIS — G8191 Hemiplegia, unspecified affecting right dominant side: Secondary | ICD-10-CM | POA: Diagnosis not present

## 2015-11-27 DIAGNOSIS — R2689 Other abnormalities of gait and mobility: Secondary | ICD-10-CM | POA: Diagnosis not present

## 2015-11-27 DIAGNOSIS — H548 Legal blindness, as defined in USA: Secondary | ICD-10-CM | POA: Diagnosis not present

## 2015-11-27 DIAGNOSIS — R569 Unspecified convulsions: Secondary | ICD-10-CM | POA: Diagnosis not present

## 2015-11-27 DIAGNOSIS — S069X9S Unspecified intracranial injury with loss of consciousness of unspecified duration, sequela: Secondary | ICD-10-CM | POA: Diagnosis not present

## 2015-12-01 DIAGNOSIS — S069X9S Unspecified intracranial injury with loss of consciousness of unspecified duration, sequela: Secondary | ICD-10-CM | POA: Diagnosis not present

## 2015-12-01 DIAGNOSIS — G8191 Hemiplegia, unspecified affecting right dominant side: Secondary | ICD-10-CM | POA: Diagnosis not present

## 2015-12-01 DIAGNOSIS — R569 Unspecified convulsions: Secondary | ICD-10-CM | POA: Diagnosis not present

## 2015-12-01 DIAGNOSIS — G309 Alzheimer's disease, unspecified: Secondary | ICD-10-CM | POA: Diagnosis not present

## 2015-12-01 DIAGNOSIS — R2689 Other abnormalities of gait and mobility: Secondary | ICD-10-CM | POA: Diagnosis not present

## 2015-12-01 DIAGNOSIS — H548 Legal blindness, as defined in USA: Secondary | ICD-10-CM | POA: Diagnosis not present

## 2015-12-03 DIAGNOSIS — R569 Unspecified convulsions: Secondary | ICD-10-CM | POA: Diagnosis not present

## 2015-12-03 DIAGNOSIS — S069X9S Unspecified intracranial injury with loss of consciousness of unspecified duration, sequela: Secondary | ICD-10-CM | POA: Diagnosis not present

## 2015-12-03 DIAGNOSIS — G309 Alzheimer's disease, unspecified: Secondary | ICD-10-CM | POA: Diagnosis not present

## 2015-12-03 DIAGNOSIS — R2689 Other abnormalities of gait and mobility: Secondary | ICD-10-CM | POA: Diagnosis not present

## 2015-12-03 DIAGNOSIS — H548 Legal blindness, as defined in USA: Secondary | ICD-10-CM | POA: Diagnosis not present

## 2015-12-03 DIAGNOSIS — G8191 Hemiplegia, unspecified affecting right dominant side: Secondary | ICD-10-CM | POA: Diagnosis not present

## 2015-12-05 DIAGNOSIS — R569 Unspecified convulsions: Secondary | ICD-10-CM | POA: Diagnosis not present

## 2015-12-05 DIAGNOSIS — R2689 Other abnormalities of gait and mobility: Secondary | ICD-10-CM | POA: Diagnosis not present

## 2015-12-05 DIAGNOSIS — S069X9S Unspecified intracranial injury with loss of consciousness of unspecified duration, sequela: Secondary | ICD-10-CM | POA: Diagnosis not present

## 2015-12-05 DIAGNOSIS — G309 Alzheimer's disease, unspecified: Secondary | ICD-10-CM | POA: Diagnosis not present

## 2015-12-05 DIAGNOSIS — G8191 Hemiplegia, unspecified affecting right dominant side: Secondary | ICD-10-CM | POA: Diagnosis not present

## 2015-12-05 DIAGNOSIS — H548 Legal blindness, as defined in USA: Secondary | ICD-10-CM | POA: Diagnosis not present

## 2015-12-08 DIAGNOSIS — G309 Alzheimer's disease, unspecified: Secondary | ICD-10-CM | POA: Diagnosis not present

## 2015-12-08 DIAGNOSIS — R2689 Other abnormalities of gait and mobility: Secondary | ICD-10-CM | POA: Diagnosis not present

## 2015-12-08 DIAGNOSIS — G8191 Hemiplegia, unspecified affecting right dominant side: Secondary | ICD-10-CM | POA: Diagnosis not present

## 2015-12-08 DIAGNOSIS — H548 Legal blindness, as defined in USA: Secondary | ICD-10-CM | POA: Diagnosis not present

## 2015-12-08 DIAGNOSIS — R569 Unspecified convulsions: Secondary | ICD-10-CM | POA: Diagnosis not present

## 2015-12-08 DIAGNOSIS — S069X9S Unspecified intracranial injury with loss of consciousness of unspecified duration, sequela: Secondary | ICD-10-CM | POA: Diagnosis not present

## 2015-12-09 DIAGNOSIS — G309 Alzheimer's disease, unspecified: Secondary | ICD-10-CM | POA: Diagnosis not present

## 2015-12-09 DIAGNOSIS — G8191 Hemiplegia, unspecified affecting right dominant side: Secondary | ICD-10-CM | POA: Diagnosis not present

## 2015-12-09 DIAGNOSIS — R569 Unspecified convulsions: Secondary | ICD-10-CM | POA: Diagnosis not present

## 2015-12-09 DIAGNOSIS — S069X9S Unspecified intracranial injury with loss of consciousness of unspecified duration, sequela: Secondary | ICD-10-CM | POA: Diagnosis not present

## 2015-12-09 DIAGNOSIS — H548 Legal blindness, as defined in USA: Secondary | ICD-10-CM | POA: Diagnosis not present

## 2015-12-09 DIAGNOSIS — R2689 Other abnormalities of gait and mobility: Secondary | ICD-10-CM | POA: Diagnosis not present

## 2015-12-10 DIAGNOSIS — S069X9S Unspecified intracranial injury with loss of consciousness of unspecified duration, sequela: Secondary | ICD-10-CM | POA: Diagnosis not present

## 2015-12-10 DIAGNOSIS — G8191 Hemiplegia, unspecified affecting right dominant side: Secondary | ICD-10-CM | POA: Diagnosis not present

## 2015-12-10 DIAGNOSIS — R569 Unspecified convulsions: Secondary | ICD-10-CM | POA: Diagnosis not present

## 2015-12-10 DIAGNOSIS — H548 Legal blindness, as defined in USA: Secondary | ICD-10-CM | POA: Diagnosis not present

## 2015-12-10 DIAGNOSIS — R2689 Other abnormalities of gait and mobility: Secondary | ICD-10-CM | POA: Diagnosis not present

## 2015-12-10 DIAGNOSIS — G309 Alzheimer's disease, unspecified: Secondary | ICD-10-CM | POA: Diagnosis not present

## 2015-12-12 DIAGNOSIS — G8191 Hemiplegia, unspecified affecting right dominant side: Secondary | ICD-10-CM | POA: Diagnosis not present

## 2015-12-12 DIAGNOSIS — R2689 Other abnormalities of gait and mobility: Secondary | ICD-10-CM | POA: Diagnosis not present

## 2015-12-12 DIAGNOSIS — H548 Legal blindness, as defined in USA: Secondary | ICD-10-CM | POA: Diagnosis not present

## 2015-12-12 DIAGNOSIS — G309 Alzheimer's disease, unspecified: Secondary | ICD-10-CM | POA: Diagnosis not present

## 2015-12-12 DIAGNOSIS — R569 Unspecified convulsions: Secondary | ICD-10-CM | POA: Diagnosis not present

## 2015-12-12 DIAGNOSIS — S069X9S Unspecified intracranial injury with loss of consciousness of unspecified duration, sequela: Secondary | ICD-10-CM | POA: Diagnosis not present

## 2015-12-15 DIAGNOSIS — R569 Unspecified convulsions: Secondary | ICD-10-CM | POA: Diagnosis not present

## 2015-12-15 DIAGNOSIS — G8191 Hemiplegia, unspecified affecting right dominant side: Secondary | ICD-10-CM | POA: Diagnosis not present

## 2015-12-15 DIAGNOSIS — R2689 Other abnormalities of gait and mobility: Secondary | ICD-10-CM | POA: Diagnosis not present

## 2015-12-15 DIAGNOSIS — S069X9S Unspecified intracranial injury with loss of consciousness of unspecified duration, sequela: Secondary | ICD-10-CM | POA: Diagnosis not present

## 2015-12-15 DIAGNOSIS — G309 Alzheimer's disease, unspecified: Secondary | ICD-10-CM | POA: Diagnosis not present

## 2015-12-15 DIAGNOSIS — H548 Legal blindness, as defined in USA: Secondary | ICD-10-CM | POA: Diagnosis not present

## 2015-12-17 DIAGNOSIS — G8191 Hemiplegia, unspecified affecting right dominant side: Secondary | ICD-10-CM | POA: Diagnosis not present

## 2015-12-17 DIAGNOSIS — R569 Unspecified convulsions: Secondary | ICD-10-CM | POA: Diagnosis not present

## 2015-12-17 DIAGNOSIS — G309 Alzheimer's disease, unspecified: Secondary | ICD-10-CM | POA: Diagnosis not present

## 2015-12-17 DIAGNOSIS — H548 Legal blindness, as defined in USA: Secondary | ICD-10-CM | POA: Diagnosis not present

## 2015-12-17 DIAGNOSIS — R2689 Other abnormalities of gait and mobility: Secondary | ICD-10-CM | POA: Diagnosis not present

## 2015-12-17 DIAGNOSIS — S069X9S Unspecified intracranial injury with loss of consciousness of unspecified duration, sequela: Secondary | ICD-10-CM | POA: Diagnosis not present

## 2015-12-18 DIAGNOSIS — G8191 Hemiplegia, unspecified affecting right dominant side: Secondary | ICD-10-CM | POA: Diagnosis not present

## 2015-12-18 DIAGNOSIS — S069X9S Unspecified intracranial injury with loss of consciousness of unspecified duration, sequela: Secondary | ICD-10-CM | POA: Diagnosis not present

## 2015-12-18 DIAGNOSIS — R569 Unspecified convulsions: Secondary | ICD-10-CM | POA: Diagnosis not present

## 2015-12-18 DIAGNOSIS — R2689 Other abnormalities of gait and mobility: Secondary | ICD-10-CM | POA: Diagnosis not present

## 2015-12-18 DIAGNOSIS — H548 Legal blindness, as defined in USA: Secondary | ICD-10-CM | POA: Diagnosis not present

## 2015-12-18 DIAGNOSIS — G309 Alzheimer's disease, unspecified: Secondary | ICD-10-CM | POA: Diagnosis not present

## 2015-12-19 DIAGNOSIS — H548 Legal blindness, as defined in USA: Secondary | ICD-10-CM | POA: Diagnosis not present

## 2015-12-19 DIAGNOSIS — G309 Alzheimer's disease, unspecified: Secondary | ICD-10-CM | POA: Diagnosis not present

## 2015-12-19 DIAGNOSIS — R2689 Other abnormalities of gait and mobility: Secondary | ICD-10-CM | POA: Diagnosis not present

## 2015-12-19 DIAGNOSIS — S069X9S Unspecified intracranial injury with loss of consciousness of unspecified duration, sequela: Secondary | ICD-10-CM | POA: Diagnosis not present

## 2015-12-19 DIAGNOSIS — R569 Unspecified convulsions: Secondary | ICD-10-CM | POA: Diagnosis not present

## 2015-12-19 DIAGNOSIS — G8191 Hemiplegia, unspecified affecting right dominant side: Secondary | ICD-10-CM | POA: Diagnosis not present

## 2015-12-23 DIAGNOSIS — G309 Alzheimer's disease, unspecified: Secondary | ICD-10-CM | POA: Diagnosis not present

## 2015-12-23 DIAGNOSIS — R2689 Other abnormalities of gait and mobility: Secondary | ICD-10-CM | POA: Diagnosis not present

## 2015-12-23 DIAGNOSIS — R569 Unspecified convulsions: Secondary | ICD-10-CM | POA: Diagnosis not present

## 2015-12-23 DIAGNOSIS — G8191 Hemiplegia, unspecified affecting right dominant side: Secondary | ICD-10-CM | POA: Diagnosis not present

## 2015-12-23 DIAGNOSIS — S069X9S Unspecified intracranial injury with loss of consciousness of unspecified duration, sequela: Secondary | ICD-10-CM | POA: Diagnosis not present

## 2015-12-23 DIAGNOSIS — H548 Legal blindness, as defined in USA: Secondary | ICD-10-CM | POA: Diagnosis not present

## 2015-12-24 DIAGNOSIS — H548 Legal blindness, as defined in USA: Secondary | ICD-10-CM | POA: Diagnosis not present

## 2015-12-24 DIAGNOSIS — G309 Alzheimer's disease, unspecified: Secondary | ICD-10-CM | POA: Diagnosis not present

## 2015-12-24 DIAGNOSIS — R569 Unspecified convulsions: Secondary | ICD-10-CM | POA: Diagnosis not present

## 2015-12-24 DIAGNOSIS — R2689 Other abnormalities of gait and mobility: Secondary | ICD-10-CM | POA: Diagnosis not present

## 2015-12-24 DIAGNOSIS — G8191 Hemiplegia, unspecified affecting right dominant side: Secondary | ICD-10-CM | POA: Diagnosis not present

## 2015-12-24 DIAGNOSIS — S069X9S Unspecified intracranial injury with loss of consciousness of unspecified duration, sequela: Secondary | ICD-10-CM | POA: Diagnosis not present

## 2015-12-25 DIAGNOSIS — R569 Unspecified convulsions: Secondary | ICD-10-CM | POA: Diagnosis not present

## 2015-12-25 DIAGNOSIS — H548 Legal blindness, as defined in USA: Secondary | ICD-10-CM | POA: Diagnosis not present

## 2015-12-25 DIAGNOSIS — G309 Alzheimer's disease, unspecified: Secondary | ICD-10-CM | POA: Diagnosis not present

## 2015-12-25 DIAGNOSIS — S069X9S Unspecified intracranial injury with loss of consciousness of unspecified duration, sequela: Secondary | ICD-10-CM | POA: Diagnosis not present

## 2015-12-25 DIAGNOSIS — R2689 Other abnormalities of gait and mobility: Secondary | ICD-10-CM | POA: Diagnosis not present

## 2015-12-25 DIAGNOSIS — G8191 Hemiplegia, unspecified affecting right dominant side: Secondary | ICD-10-CM | POA: Diagnosis not present

## 2015-12-30 DIAGNOSIS — G8191 Hemiplegia, unspecified affecting right dominant side: Secondary | ICD-10-CM | POA: Diagnosis not present

## 2015-12-30 DIAGNOSIS — R569 Unspecified convulsions: Secondary | ICD-10-CM | POA: Diagnosis not present

## 2015-12-30 DIAGNOSIS — H548 Legal blindness, as defined in USA: Secondary | ICD-10-CM | POA: Diagnosis not present

## 2015-12-30 DIAGNOSIS — S069X9S Unspecified intracranial injury with loss of consciousness of unspecified duration, sequela: Secondary | ICD-10-CM | POA: Diagnosis not present

## 2015-12-30 DIAGNOSIS — R2689 Other abnormalities of gait and mobility: Secondary | ICD-10-CM | POA: Diagnosis not present

## 2015-12-30 DIAGNOSIS — G309 Alzheimer's disease, unspecified: Secondary | ICD-10-CM | POA: Diagnosis not present

## 2016-01-01 DIAGNOSIS — S069X9S Unspecified intracranial injury with loss of consciousness of unspecified duration, sequela: Secondary | ICD-10-CM | POA: Diagnosis not present

## 2016-01-01 DIAGNOSIS — R2689 Other abnormalities of gait and mobility: Secondary | ICD-10-CM | POA: Diagnosis not present

## 2016-01-01 DIAGNOSIS — G8191 Hemiplegia, unspecified affecting right dominant side: Secondary | ICD-10-CM | POA: Diagnosis not present

## 2016-01-01 DIAGNOSIS — R569 Unspecified convulsions: Secondary | ICD-10-CM | POA: Diagnosis not present

## 2016-01-01 DIAGNOSIS — G309 Alzheimer's disease, unspecified: Secondary | ICD-10-CM | POA: Diagnosis not present

## 2016-01-01 DIAGNOSIS — H548 Legal blindness, as defined in USA: Secondary | ICD-10-CM | POA: Diagnosis not present

## 2016-01-15 DIAGNOSIS — R2689 Other abnormalities of gait and mobility: Secondary | ICD-10-CM | POA: Diagnosis not present

## 2016-01-15 DIAGNOSIS — H548 Legal blindness, as defined in USA: Secondary | ICD-10-CM | POA: Diagnosis not present

## 2016-01-15 DIAGNOSIS — G309 Alzheimer's disease, unspecified: Secondary | ICD-10-CM | POA: Diagnosis not present

## 2016-01-15 DIAGNOSIS — G8191 Hemiplegia, unspecified affecting right dominant side: Secondary | ICD-10-CM | POA: Diagnosis not present

## 2016-01-15 DIAGNOSIS — S069X9S Unspecified intracranial injury with loss of consciousness of unspecified duration, sequela: Secondary | ICD-10-CM | POA: Diagnosis not present

## 2016-01-15 DIAGNOSIS — R569 Unspecified convulsions: Secondary | ICD-10-CM | POA: Diagnosis not present

## 2016-02-06 DIAGNOSIS — G309 Alzheimer's disease, unspecified: Secondary | ICD-10-CM | POA: Diagnosis not present

## 2016-02-06 DIAGNOSIS — Z79899 Other long term (current) drug therapy: Secondary | ICD-10-CM | POA: Diagnosis not present

## 2016-02-06 DIAGNOSIS — Z9181 History of falling: Secondary | ICD-10-CM | POA: Diagnosis not present

## 2016-02-06 DIAGNOSIS — R569 Unspecified convulsions: Secondary | ICD-10-CM | POA: Diagnosis not present

## 2016-02-06 DIAGNOSIS — Z0000 Encounter for general adult medical examination without abnormal findings: Secondary | ICD-10-CM | POA: Diagnosis not present

## 2016-02-06 DIAGNOSIS — E039 Hypothyroidism, unspecified: Secondary | ICD-10-CM | POA: Diagnosis not present

## 2016-03-03 DIAGNOSIS — H401113 Primary open-angle glaucoma, right eye, severe stage: Secondary | ICD-10-CM | POA: Diagnosis not present

## 2016-03-03 DIAGNOSIS — H401123 Primary open-angle glaucoma, left eye, severe stage: Secondary | ICD-10-CM | POA: Diagnosis not present

## 2016-03-04 DIAGNOSIS — R351 Nocturia: Secondary | ICD-10-CM | POA: Diagnosis not present

## 2016-03-04 DIAGNOSIS — N138 Other obstructive and reflux uropathy: Secondary | ICD-10-CM | POA: Diagnosis not present

## 2016-03-04 DIAGNOSIS — Z0000 Encounter for general adult medical examination without abnormal findings: Secondary | ICD-10-CM | POA: Diagnosis not present

## 2016-03-04 DIAGNOSIS — R338 Other retention of urine: Secondary | ICD-10-CM | POA: Diagnosis not present

## 2016-03-04 DIAGNOSIS — N401 Enlarged prostate with lower urinary tract symptoms: Secondary | ICD-10-CM | POA: Diagnosis not present

## 2016-03-04 DIAGNOSIS — R350 Frequency of micturition: Secondary | ICD-10-CM | POA: Diagnosis not present

## 2016-03-10 ENCOUNTER — Ambulatory Visit (INDEPENDENT_AMBULATORY_CARE_PROVIDER_SITE_OTHER): Payer: Medicare Other | Admitting: Podiatry

## 2016-03-10 ENCOUNTER — Encounter: Payer: Self-pay | Admitting: Podiatry

## 2016-03-10 DIAGNOSIS — B351 Tinea unguium: Secondary | ICD-10-CM | POA: Diagnosis not present

## 2016-03-10 DIAGNOSIS — M79673 Pain in unspecified foot: Secondary | ICD-10-CM

## 2016-03-10 NOTE — Progress Notes (Signed)
Subjective:     Patient ID: Erik Massey, male   DOB: 12/27/1928, 82 y.o.   MRN: [id number]  HPIThis patient presents to the office for preventive foot care services.  He says his nail have grown long and thick and he is unable to self treat. His nails are painful wearing shoes.  It has been months since his last visit.   Review of Systems     Objective:   Physical Exam GENERAL APPEARANCE: Alert, conversant. Appropriately groomed. No acute distress.  VASCULAR: Pedal pulses are not  palpable at  DP and PT bilateral.  Capillary refill time is immediate to all digits,  Cold feet noted.l  NEUROLOGIC: sensation is normal to 5.07 monofilament at 5/5 sites bilateral.  Light touch is intact bilateral, Muscle strength normal.  MUSCULOSKELETAL: acceptable muscle strength, tone and stability bilateral.  Intrinsic muscluature intact bilateral.  Rectus appearance of foot and digits noted bilateral. Severely contracted digits left foot with overlapping second toe left foot. Severe HAV 1st MPJ B/L.  Hammer toes 2-5 B/L  DERMATOLOGIC: skin color, texture, and turgor are within normal limits.  No preulcerative lesions or ulcers  are seen, no interdigital maceration noted.  No open lesions present.  No drainage noted.  NAILS  Thick disfigured discolored nails both feet      Assessment:     Onychomycosis     Plan:     Debridement of Nails both feet.  RTC 3 months    Jaidin Richison DPM      

## 2016-05-14 DIAGNOSIS — H900 Conductive hearing loss, bilateral: Secondary | ICD-10-CM | POA: Diagnosis not present

## 2016-06-09 ENCOUNTER — Ambulatory Visit (INDEPENDENT_AMBULATORY_CARE_PROVIDER_SITE_OTHER): Payer: Medicare Other | Admitting: Podiatry

## 2016-06-09 ENCOUNTER — Encounter: Payer: Self-pay | Admitting: Podiatry

## 2016-06-09 DIAGNOSIS — B351 Tinea unguium: Secondary | ICD-10-CM | POA: Diagnosis not present

## 2016-06-09 DIAGNOSIS — M79673 Pain in unspecified foot: Secondary | ICD-10-CM

## 2016-06-09 NOTE — Progress Notes (Signed)
Subjective:     Patient ID: KHAMAR STANDARD, male   DOB: [birth year], 82 y.o.   MRN: [id number]  HPIThis patient presents to the office for preventive foot care services.  He says his nail have grown long and thick and he is unable to self treat. His nails are painful wearing shoes.  It has been months since his last visit.   Review of Systems     Objective:   Physical Exam GENERAL APPEARANCE: Alert, conversant. Appropriately groomed. No acute distress.  VASCULAR: Pedal pulses are not  palpable at  [facility] and PT bilateral.  Capillary refill time is immediate to all digits,  Cold feet noted.l  NEUROLOGIC: sensation is normal to 5.07 monofilament at 5/5 sites bilateral.  Light touch is intact bilateral, Muscle strength normal.  MUSCULOSKELETAL: acceptable muscle strength, tone and stability bilateral.  Intrinsic muscluature intact bilateral.  Rectus appearance of foot and digits noted bilateral. Severely contracted digits left foot with overlapping second toe left foot. Severe HAV 1st MPJ B/L.  Hammer toes 2-5 B/L  DERMATOLOGIC: skin color, texture, and turgor are within normal limits.  No preulcerative lesions or ulcers  are seen, no interdigital maceration noted.  No open lesions present.  No drainage noted.  NAILS  Thick disfigured discolored nails both feet      Assessment:     Onychomycosis     Plan:     Debridement of Nails both feet.  RTC 3 months    Gardiner Barefoot DPM

## 2016-06-18 DIAGNOSIS — E039 Hypothyroidism, unspecified: Secondary | ICD-10-CM | POA: Diagnosis not present

## 2016-06-18 DIAGNOSIS — F0390 Unspecified dementia without behavioral disturbance: Secondary | ICD-10-CM | POA: Diagnosis not present

## 2016-06-18 DIAGNOSIS — Z79899 Other long term (current) drug therapy: Secondary | ICD-10-CM | POA: Diagnosis not present

## 2016-06-18 DIAGNOSIS — G309 Alzheimer's disease, unspecified: Secondary | ICD-10-CM | POA: Diagnosis not present

## 2016-06-18 DIAGNOSIS — R569 Unspecified convulsions: Secondary | ICD-10-CM | POA: Diagnosis not present

## 2016-06-18 DIAGNOSIS — E559 Vitamin D deficiency, unspecified: Secondary | ICD-10-CM | POA: Diagnosis not present

## 2016-09-08 ENCOUNTER — Encounter: Payer: Self-pay | Admitting: Podiatry

## 2016-09-08 ENCOUNTER — Ambulatory Visit (INDEPENDENT_AMBULATORY_CARE_PROVIDER_SITE_OTHER): Payer: Medicare Other | Admitting: Podiatry

## 2016-09-08 VITALS — Ht 71.0 in | Wt 170.0 lb

## 2016-09-08 DIAGNOSIS — B351 Tinea unguium: Secondary | ICD-10-CM

## 2016-09-08 DIAGNOSIS — M79676 Pain in unspecified toe(s): Secondary | ICD-10-CM | POA: Diagnosis not present

## 2016-09-08 NOTE — Progress Notes (Signed)
Subjective:     Patient ID: Erik Massey, male   DOB: [birth year], 82 y.o.   MRN: [id number]  HPIThis patient presents to the office for preventive foot care services.  He says his nail have grown long and thick and he is unable to self treat. His nails are painful wearing shoes.  It has been months since his last visit.   Review of Systems     Objective:   Physical Exam GENERAL APPEARANCE: Alert, conversant. Appropriately groomed. No acute distress.  VASCULAR: Pedal pulses are not  palpable at  [facility] and PT bilateral.  Capillary refill time is immediate to all digits,  Cold feet noted.l  NEUROLOGIC: sensation is normal to 5.07 monofilament at 5/5 sites bilateral.  Light touch is intact bilateral, Muscle strength normal.  MUSCULOSKELETAL: acceptable muscle strength, tone and stability bilateral.  Intrinsic muscluature intact bilateral.  Rectus appearance of foot and digits noted bilateral. Severely contracted digits left foot with overlapping second toe left foot. Severe HAV 1st MPJ B/L.  Hammer toes 2-5 B/L  DERMATOLOGIC: skin color, texture, and turgor are within normal limits.  No preulcerative lesions or ulcers  are seen, no interdigital maceration noted.  No open lesions present.  No drainage noted.  NAILS  Thick disfigured discolored nails both feet      Assessment:     Onychomycosis     Plan:     Debridement of Nails both feet.  RTC 3 months    Gardiner Barefoot DPM

## 2016-09-09 DIAGNOSIS — N3281 Overactive bladder: Secondary | ICD-10-CM | POA: Diagnosis not present

## 2016-09-09 DIAGNOSIS — N401 Enlarged prostate with lower urinary tract symptoms: Secondary | ICD-10-CM | POA: Diagnosis not present

## 2016-09-09 DIAGNOSIS — R351 Nocturia: Secondary | ICD-10-CM | POA: Diagnosis not present

## 2016-09-15 DIAGNOSIS — H401113 Primary open-angle glaucoma, right eye, severe stage: Secondary | ICD-10-CM | POA: Diagnosis not present

## 2016-12-15 ENCOUNTER — Encounter: Payer: Self-pay | Admitting: Podiatry

## 2016-12-15 ENCOUNTER — Ambulatory Visit (INDEPENDENT_AMBULATORY_CARE_PROVIDER_SITE_OTHER): Payer: Medicare Other | Admitting: Podiatry

## 2016-12-15 DIAGNOSIS — B351 Tinea unguium: Secondary | ICD-10-CM

## 2016-12-15 DIAGNOSIS — M79676 Pain in unspecified toe(s): Secondary | ICD-10-CM

## 2016-12-15 NOTE — Progress Notes (Signed)
Subjective:     Patient ID: Erik Massey, male   DOB: 02-13-1929, 83 y.o.   MRN: [id number]  HPIThis patient presents to the office for preventive foot care services.  He says his nail have grown long and thick and he is unable to self treat. His nails are painful wearing shoes.  It has been 3  months since his last visit.   Review of Systems     Objective:   Physical Exam GENERAL APPEARANCE: Alert, conversant. Appropriately groomed. No acute distress.  VASCULAR: Pedal pulses are not  palpable at  [facility] and PT bilateral.  Capillary refill time is immediate to all digits,  Cold feet noted.l  NEUROLOGIC: sensation is normal to 5.07 monofilament at 5/5 sites bilateral.  Light touch is intact bilateral, Muscle strength normal.  MUSCULOSKELETAL: acceptable muscle strength, tone and stability bilateral.  Intrinsic muscluature intact bilateral.  Rectus appearance of foot and digits noted bilateral. Severely contracted digits left foot with overlapping second toe left foot. Severe HAV 1st MPJ B/L.  Hammer toes 2-5 B/L  DERMATOLOGIC: skin color, texture, and turgor are within normal limits.  No preulcerative lesions or ulcers  are seen, no interdigital maceration noted.  No open lesions present.  No drainage noted.  NAILS  Thick disfigured discolored nails both feet      Assessment:     Onychomycosis     Plan:     Debridement of Nails both feet.  RTC 3 months    Gardiner Barefoot DPM

## 2016-12-17 DIAGNOSIS — R32 Unspecified urinary incontinence: Secondary | ICD-10-CM | POA: Diagnosis not present

## 2016-12-17 DIAGNOSIS — E039 Hypothyroidism, unspecified: Secondary | ICD-10-CM | POA: Diagnosis not present

## 2016-12-17 DIAGNOSIS — G309 Alzheimer's disease, unspecified: Secondary | ICD-10-CM | POA: Diagnosis not present

## 2017-03-16 ENCOUNTER — Ambulatory Visit: Payer: Medicare Other | Admitting: Podiatry

## 2017-03-16 DIAGNOSIS — H401113 Primary open-angle glaucoma, right eye, severe stage: Secondary | ICD-10-CM | POA: Diagnosis not present

## 2017-03-16 DIAGNOSIS — Z970 Presence of artificial eye: Secondary | ICD-10-CM | POA: Diagnosis not present

## 2017-03-24 ENCOUNTER — Emergency Department (HOSPITAL_COMMUNITY): Payer: Medicare Other

## 2017-03-24 ENCOUNTER — Encounter (HOSPITAL_COMMUNITY): Payer: Self-pay

## 2017-03-24 ENCOUNTER — Inpatient Hospital Stay (HOSPITAL_COMMUNITY)
Admission: EM | Admit: 2017-03-24 | Discharge: 2017-03-28 | DRG: 683 | Disposition: A | Discharge status: Skilled Nursing Facility | Payer: Medicare Other | Attending: Internal Medicine | Admitting: Internal Medicine

## 2017-03-24 DIAGNOSIS — R1902 Left upper quadrant abdominal swelling, mass and lump: Secondary | ICD-10-CM | POA: Diagnosis present

## 2017-03-24 DIAGNOSIS — F0280 Dementia in other diseases classified elsewhere without behavioral disturbance: Secondary | ICD-10-CM | POA: Diagnosis present

## 2017-03-24 DIAGNOSIS — D696 Thrombocytopenia, unspecified: Secondary | ICD-10-CM | POA: Diagnosis present

## 2017-03-24 DIAGNOSIS — R188 Other ascites: Secondary | ICD-10-CM | POA: Diagnosis present

## 2017-03-24 DIAGNOSIS — Z66 Do not resuscitate: Secondary | ICD-10-CM | POA: Diagnosis present

## 2017-03-24 DIAGNOSIS — E875 Hyperkalemia: Secondary | ICD-10-CM | POA: Diagnosis present

## 2017-03-24 DIAGNOSIS — K6289 Other specified diseases of anus and rectum: Secondary | ICD-10-CM | POA: Diagnosis not present

## 2017-03-24 DIAGNOSIS — K5909 Other constipation: Secondary | ICD-10-CM | POA: Diagnosis not present

## 2017-03-24 DIAGNOSIS — E46 Unspecified protein-calorie malnutrition: Secondary | ICD-10-CM | POA: Diagnosis present

## 2017-03-24 DIAGNOSIS — N179 Acute kidney failure, unspecified: Principal | ICD-10-CM | POA: Diagnosis present

## 2017-03-24 DIAGNOSIS — K529 Noninfective gastroenteritis and colitis, unspecified: Secondary | ICD-10-CM | POA: Diagnosis not present

## 2017-03-24 DIAGNOSIS — K659 Peritonitis, unspecified: Secondary | ICD-10-CM

## 2017-03-24 DIAGNOSIS — R41841 Cognitive communication deficit: Secondary | ICD-10-CM | POA: Diagnosis not present

## 2017-03-24 DIAGNOSIS — N323 Diverticulum of bladder: Secondary | ICD-10-CM | POA: Diagnosis present

## 2017-03-24 DIAGNOSIS — N401 Enlarged prostate with lower urinary tract symptoms: Secondary | ICD-10-CM | POA: Diagnosis not present

## 2017-03-24 DIAGNOSIS — Z681 Body mass index (BMI) 19 or less, adult: Secondary | ICD-10-CM

## 2017-03-24 DIAGNOSIS — R100 Acute abdomen: Secondary | ICD-10-CM | POA: Diagnosis not present

## 2017-03-24 DIAGNOSIS — Z880 Allergy status to penicillin: Secondary | ICD-10-CM | POA: Diagnosis not present

## 2017-03-24 DIAGNOSIS — E860 Dehydration: Secondary | ICD-10-CM | POA: Diagnosis present

## 2017-03-24 DIAGNOSIS — R339 Retention of urine, unspecified: Secondary | ICD-10-CM | POA: Diagnosis not present

## 2017-03-24 DIAGNOSIS — R627 Adult failure to thrive: Secondary | ICD-10-CM | POA: Diagnosis present

## 2017-03-24 DIAGNOSIS — R918 Other nonspecific abnormal finding of lung field: Secondary | ICD-10-CM | POA: Diagnosis not present

## 2017-03-24 DIAGNOSIS — R338 Other retention of urine: Secondary | ICD-10-CM | POA: Diagnosis present

## 2017-03-24 DIAGNOSIS — K2970 Gastritis, unspecified, without bleeding: Secondary | ICD-10-CM | POA: Diagnosis not present

## 2017-03-24 DIAGNOSIS — C49A0 Gastrointestinal stromal tumor, unspecified site: Secondary | ICD-10-CM | POA: Diagnosis not present

## 2017-03-24 DIAGNOSIS — F0150 Vascular dementia without behavioral disturbance: Secondary | ICD-10-CM | POA: Diagnosis not present

## 2017-03-24 DIAGNOSIS — R2681 Unsteadiness on feet: Secondary | ICD-10-CM | POA: Diagnosis not present

## 2017-03-24 DIAGNOSIS — M6281 Muscle weakness (generalized): Secondary | ICD-10-CM | POA: Diagnosis not present

## 2017-03-24 DIAGNOSIS — G309 Alzheimer's disease, unspecified: Secondary | ICD-10-CM | POA: Diagnosis present

## 2017-03-24 DIAGNOSIS — R10817 Generalized abdominal tenderness: Secondary | ICD-10-CM | POA: Diagnosis not present

## 2017-03-24 DIAGNOSIS — R278 Other lack of coordination: Secondary | ICD-10-CM | POA: Diagnosis not present

## 2017-03-24 DIAGNOSIS — R109 Unspecified abdominal pain: Secondary | ICD-10-CM | POA: Diagnosis not present

## 2017-03-24 DIAGNOSIS — N3020 Other chronic cystitis without hematuria: Secondary | ICD-10-CM | POA: Diagnosis not present

## 2017-03-24 DIAGNOSIS — R569 Unspecified convulsions: Secondary | ICD-10-CM

## 2017-03-24 DIAGNOSIS — E039 Hypothyroidism, unspecified: Secondary | ICD-10-CM | POA: Diagnosis present

## 2017-03-24 DIAGNOSIS — R488 Other symbolic dysfunctions: Secondary | ICD-10-CM | POA: Diagnosis not present

## 2017-03-24 DIAGNOSIS — K8000 Calculus of gallbladder with acute cholecystitis without obstruction: Secondary | ICD-10-CM | POA: Diagnosis not present

## 2017-03-24 DIAGNOSIS — K5130 Ulcerative (chronic) rectosigmoiditis without complications: Secondary | ICD-10-CM | POA: Diagnosis present

## 2017-03-24 DIAGNOSIS — R262 Difficulty in walking, not elsewhere classified: Secondary | ICD-10-CM | POA: Diagnosis not present

## 2017-03-24 DIAGNOSIS — N189 Chronic kidney disease, unspecified: Secondary | ICD-10-CM | POA: Diagnosis not present

## 2017-03-24 DIAGNOSIS — R1012 Left upper quadrant pain: Secondary | ICD-10-CM | POA: Diagnosis not present

## 2017-03-24 LAB — URINALYSIS, COMPLETE (UACMP) WITH MICROSCOPIC
Bacteria, UA: NONE SEEN
Bilirubin Urine: NEGATIVE
Glucose, UA: NEGATIVE mg/dL
Ketones, ur: NEGATIVE mg/dL
Leukocytes, UA: NEGATIVE
Nitrite: NEGATIVE
Protein, ur: NEGATIVE mg/dL
Specific Gravity, Urine: 1.023 (ref 1.005–1.030)
pH: 5 (ref 5.0–8.0)

## 2017-03-24 LAB — COMPREHENSIVE METABOLIC PANEL
ALT: 38 U/L (ref 17–63)
AST: 46 U/L — ABNORMAL HIGH (ref 15–41)
Albumin: 4.1 g/dL (ref 3.5–5.0)
Alkaline Phosphatase: 73 U/L (ref 38–126)
Anion gap: 12 (ref 5–15)
BUN: 74 mg/dL — ABNORMAL HIGH (ref 6–20)
CO2: 23 mmol/L (ref 22–32)
Calcium: 9.8 mg/dL (ref 8.9–10.3)
Chloride: 109 mmol/L (ref 101–111)
Creatinine, Ser: 4.65 mg/dL — ABNORMAL HIGH (ref 0.61–1.24)
GFR calc Af Amer: 12 mL/min — ABNORMAL LOW (ref >60)
GFR calc non Af Amer: 10 mL/min — ABNORMAL LOW (ref >60)
Glucose, Bld: 100 mg/dL — ABNORMAL HIGH (ref 65–99)
Potassium: 5.3 mmol/L — ABNORMAL HIGH (ref 3.5–5.1)
Sodium: 144 mmol/L (ref 135–145)
Total Bilirubin: 0.4 mg/dL (ref 0.3–1.2)
Total Protein: 7.5 g/dL (ref 6.5–8.1)

## 2017-03-24 LAB — CBC WITH DIFFERENTIAL/PLATELET
Basophils Absolute: 0 10*3/uL (ref 0.0–0.1)
Basophils Relative: 0 %
Eosinophils Absolute: 0 10*3/uL (ref 0.0–0.7)
Eosinophils Relative: 0 %
HCT: 38.9 % — ABNORMAL LOW (ref 39.0–52.0)
Hemoglobin: 13.3 g/dL (ref 13.0–17.0)
Lymphocytes Relative: 8 %
Lymphs Abs: 0.7 10*3/uL (ref 0.7–4.0)
MCH: 30 pg (ref 26.0–34.0)
MCHC: 34.2 g/dL (ref 30.0–36.0)
MCV: 87.6 fL (ref 78.0–100.0)
Monocytes Absolute: 0.7 10*3/uL (ref 0.1–1.0)
Monocytes Relative: 8 %
Neutro Abs: 7.7 10*3/uL (ref 1.7–7.7)
Neutrophils Relative %: 84 %
Platelets: 125 10*3/uL — ABNORMAL LOW (ref 150–400)
RBC: 4.44 MIL/uL (ref 4.22–5.81)
RDW: 13.5 % (ref 11.5–15.5)
WBC: 9.1 10*3/uL (ref 4.0–10.5)

## 2017-03-24 MED ORDER — SODIUM BICARBONATE 8.4 % IV SOLN
50.0000 meq | Freq: Once | INTRAVENOUS | Status: AC
  Administered 2017-03-24: 50 meq via INTRAVENOUS
  Filled 2017-03-24: qty 50

## 2017-03-24 MED ORDER — LATANOPROST 0.005 % OP SOLN
1.0000 [drp] | Freq: Every day | OPHTHALMIC | Status: DC
Start: 2017-03-24 — End: 2017-03-28
  Administered 2017-03-25 – 2017-03-27 (×4): 1 [drp] via OPHTHALMIC
  Filled 2017-03-24: qty 2.5

## 2017-03-24 MED ORDER — IOPAMIDOL (ISOVUE-300) INJECTION 61%
INTRAVENOUS | Status: AC
  Filled 2017-03-24: qty 100

## 2017-03-24 MED ORDER — ACETAMINOPHEN 325 MG PO TABS
650.0000 mg | ORAL_TABLET | Freq: Four times a day (QID) | ORAL | Status: DC | PRN
  Administered 2017-03-26 (×2): 650 mg via ORAL
  Filled 2017-03-24 (×2): qty 2

## 2017-03-24 MED ORDER — LIDOCAINE HCL 2 % EX GEL
1.0000 "application " | Freq: Once | CUTANEOUS | Status: DC | PRN
  Filled 2017-03-24: qty 11

## 2017-03-24 MED ORDER — CALCIUM CHLORIDE 10 % IV SOLN
1.0000 g | Freq: Once | INTRAVENOUS | Status: AC
  Administered 2017-03-24: 1 g via INTRAVENOUS
  Filled 2017-03-24: qty 10

## 2017-03-24 MED ORDER — SODIUM CHLORIDE 0.9 % IV SOLN
INTRAVENOUS | Status: DC
  Administered 2017-03-24 – 2017-03-28 (×4): via INTRAVENOUS

## 2017-03-24 MED ORDER — DORZOLAMIDE HCL 2 % OP SOLN
1.0000 [drp] | Freq: Two times a day (BID) | OPHTHALMIC | Status: DC
  Administered 2017-03-25 – 2017-03-28 (×8): 1 [drp] via OPHTHALMIC
  Filled 2017-03-24: qty 10

## 2017-03-24 MED ORDER — VALPROATE SODIUM 500 MG/5ML IV SOLN
500.0000 mg | Freq: Two times a day (BID) | INTRAVENOUS | Status: DC
  Administered 2017-03-25 (×2): 500 mg via INTRAVENOUS
  Filled 2017-03-24 (×3): qty 5

## 2017-03-24 MED ORDER — ACETAMINOPHEN 650 MG RE SUPP: 650.0000 mg | Freq: Four times a day (QID) | RECTAL | Status: DC | PRN

## 2017-03-24 MED ORDER — SODIUM CHLORIDE 0.9 % IV BOLUS (SEPSIS)
2000.0000 mL | Freq: Once | INTRAVENOUS | Status: AC
  Administered 2017-03-25: 2000 mL via INTRAVENOUS

## 2017-03-24 MED ORDER — BETAXOLOL HCL 0.25 % OP SUSP
1.0000 [drp] | Freq: Every day | OPHTHALMIC | Status: DC
  Administered 2017-03-25 – 2017-03-28 (×4): 1 [drp] via OPHTHALMIC
  Filled 2017-03-24: qty 10

## 2017-03-24 MED ORDER — MORPHINE SULFATE (PF) 4 MG/ML IV SOLN
6.0000 mg | Freq: Once | INTRAVENOUS | Status: AC
  Administered 2017-03-24: 6 mg via INTRAVENOUS
  Filled 2017-03-24: qty 2

## 2017-03-24 MED ORDER — LEVOTHYROXINE SODIUM 100 MCG IV SOLR
75.0000 ug | Freq: Every day | INTRAVENOUS | Status: DC
  Administered 2017-03-25: 75 ug via INTRAVENOUS
  Filled 2017-03-24: qty 5

## 2017-03-24 NOTE — ED Notes (Signed)
Bed: XH37 Expected date:  Expected time:  Means of arrival:  Comments: EMS- possible SBO

## 2017-03-24 NOTE — ED Triage Notes (Signed)
Pt BIB [facility] c/o lower abdominal pain. Pt LBM was loose and 2 days ago. At baseline, pt has amnesia and short term memory loss. He is oriented to baseline. Dark crust noted around pt lips, but pt not vomiting in triage.

## 2017-03-24 NOTE — ED Provider Notes (Signed)
[facility] DEPT Provider Note   CSN: [id number] Arrival date & time: 03/24/17  1650  By signing my name below, I, Erik Massey, attest that this documentation has been prepared under the direction and in the presence of physician practitioner, Erik Manifold, MD. Electronically Signed: Dora Massey, Scribe. 03/24/2017. 5:17 PM.  History   Chief Complaint Chief Complaint  Patient presents with  . Abdominal Pain   The history is provided by a relative. No language interpreter was used.    HPI Comments: LEVEL 5 CAVEAT BECAUSE PATIENT IS NONVERBAL Erik Massey is a 83 y.o. male with PMHx including Alzheimer's dementia and seizures who presents to the Emergency Department via EMS for evaluation of constant abdominal pain beginning earlier today. Per a relative, patient has had nausea without any vomiting for several days as well as an episode of diarrhea a couple days ago. Family member reports that patient started to complain of significant abdominal pain today. Patient indicates pain exacerbation with applied pressure to his abdomen. No alleviating factors noted. No other complaints noted at this time.   Past Medical History:  Diagnosis Date  . Alzheimer disease   . Seizures [facility])     Patient Active Problem List   Diagnosis Date Noted  . Seizure ([place]) 10/20/2015  . UTI (lower urinary tract infection) 10/20/2015  . Falls 10/20/2015  . Cough 10/20/2015  . Physical deconditioning   . BPH (benign prostatic hyperplasia)     Past Surgical History:  Procedure Laterality Date  . KNEE SURGERY         Home Medications    Prior to Admission medications   Medication Sig Start Date End Date Taking? Authorizing Provider  BETOPTIC-S 0.25 % ophthalmic suspension Place 1 drop into the right eye daily.  05/17/14   [provider]  carbamazepine (TEGRETOL XR) 400 MG 12 hr tablet Take 1 tablet (400 mg total) by mouth 2 (two) times daily. 10/20/15   Orpah Greek,  MD  dorzolamide (TRUSOPT) 2 % ophthalmic solution Place 1 drop into the right eye every 12 (twelve) hours.  05/16/14   [provider]  EXELON 4.6 MG/24HR Place 4.6 mg onto the skin daily. Take one off and put the other on 04/11/14   [provider]  finasteride (PROSCAR) 5 MG tablet Take 5 mg by mouth daily.  05/03/14   [provider]  latanoprost (XALATAN) 0.005 % ophthalmic solution Place 1 drop into the right eye at bedtime.  04/19/14   [provider]  mirabegron ER (MYRBETRIQ) 50 MG TB24 tablet Take 50 mg by mouth every evening.    [provider]  Protein (PROCEL 100 PO) Take 1 scoop by mouth 2 (two) times daily.    [provider]  senna-docusate (SENOKOT-S) 8.6-50 MG tablet Take 1 tablet by mouth 2 (two) times daily.    [provider]  SYNTHROID 50 MCG tablet Take 50 mcg by mouth daily before breakfast.  05/14/14   [provider]  tamsulosin (FLOMAX) 0.4 MG CAPS capsule Take 0.8 mg by mouth daily after supper.  05/14/14   [provider]  vitamin B-12 (CYANOCOBALAMIN) 1000 MCG tablet Take 2,000 mcg by mouth daily.    [provider]    Family History Family History  Problem Relation Age of Onset  . Family history unknown: Yes    Social History Social History  Substance Use Topics  . Smoking status: Never Smoker  . Smokeless tobacco: Never Used  . Alcohol use  No     Allergies   Penicillins   Review of Systems Review of Systems  Unable to perform ROS: Patient nonverbal   Physical Exam Updated Vital Signs BP (!) 157/98 (BP Location: Left Arm)   Pulse 70   Temp 97.7 F (36.5 C) (Oral)   Resp 14   SpO2 98%   Physical Exam  Constitutional: He appears well-developed.  HENT:  Head: Normocephalic.  Right Ear: External ear normal.  Left Ear: External ear normal.  Nose: Nose normal.  Eyes: Conjunctivae are normal. Right eye exhibits no discharge. Left eye exhibits no discharge.  Neck:  Normal range of motion.  Cardiovascular: Normal rate, regular rhythm and normal heart sounds.   No murmur heard. Pulmonary/Chest: Effort normal and breath sounds normal. No respiratory distress. He has no wheezes. He has no rales.  Abdominal: There is tenderness. There is rigidity and guarding.  Abdomen is extremely tender. He is guarding. Lesion in mons area. Likely dermoid cyst. No hernia appreciated.   Musculoskeletal: Normal range of motion. He exhibits no edema or tenderness.  Neurological: He is alert. No cranial nerve deficit. Coordination normal.  Skin: Skin is warm and dry. No rash noted. No erythema. No pallor.  Psychiatric: He has a normal mood and affect. His behavior is normal.  Nursing note and vitals reviewed.  ED Treatments / Results  Labs (all labs ordered are listed, but only abnormal results are displayed) Labs Reviewed - No data to display  EKG  EKG Interpretation None       Radiology No results found.  Procedures Procedures (including critical care time)  DIAGNOSTIC STUDIES: Oxygen Saturation is 98% on RA, normal by my interpretation.   Medications Ordered in ED Medications - No data to display   Initial Impression / Assessment and Plan / ED Course  I have reviewed the triage vital signs and the nursing notes.  Pertinent labs & imaging results that were available during my care of the patient were reviewed by me and considered in my medical decision making (see chart for details).     87yM with abdominal pain. I am very concerned about his exam. Doesn't seem distended. No reported vomiting. He is exquisitely tender though. Hard to get great exam because he simply doesn't want to be touched in his abdomen.   7:28 PM Significant renal impairment. He probably has urinary retention. He is most tender suprapubically. Diaper dry. Hx of BPH. Will place foley. Mild hyperkalemia. Will check EKG.   Foley really didn't put out much. CT with known mass but  enlarged from previous imaging. Pain may be from proctocolitis. IVF. Daughter at bedside. Agreeable to  IV hydration, antibiotics, etc but not aggressive measures and is DNR.   Final Clinical Impressions(s) / ED Diagnoses   Final diagnoses:  Peritonitis ([place])  Abdominal pain, unspecified abdominal location  Acute renal failure, unspecified acute renal failure type [facility])     New Prescriptions New Prescriptions   No medications on file   I personally preformed the services scribed in my presence. The recorded information has been reviewed is accurate. Erik Manifold, MD.    Erik Manifold, MD 04/02/17 [phone]

## 2017-03-24 NOTE — ED Notes (Signed)
This RN attempted IV access x2. Unable to obtain.

## 2017-03-24 NOTE — H&P (Signed)
History and Physical    Erik Massey UDJ:[id number] DOB: 1929/08/08 DOA: 03/24/2017  PCP: Glendale Chard, MD  Patient coming from: Home.  Chief Complaint: Abdominal pain.  HPI: Erik Massey is a 83 y.o. male with history of dementia, seizures was brought to the ER after patient was complaining of abdominal pain. Most of the history is obtained from ER physician has patient family is not able to be reached. Patient is demented and does not provide history. As per the ER physician patient has been complaining of abdominal pain but did not have any nausea vomiting or diarrhea.   ED Course: On exam patient has diffuse abdominal tenderness. CT of the abdomen shows left upper quadrant mass which is chronic but enlarging in size. There is also concern for proctocolitis. Patient's labs reveal acute renal failure with mild hyperkalemia. Patient was given IV calcium and bicarbonate. Patient was placed on fluid bolus and admitted for acute renal failure and abdominal pain.  Review of Systems: As per HPI, rest all negative.   Past Medical History:  Diagnosis Date  . Alzheimer disease   . Seizures ([place])     Past Surgical History:  Procedure Laterality Date  . KNEE SURGERY       reports that he has never smoked. He has never used smokeless tobacco. He reports that he does not drink alcohol. His drug history is not on file.  Allergies  Allergen Reactions  . Penicillins Swelling    Has patient had a PCN reaction causing immediate rash, facial/tongue/throat swelling, SOB or lightheadedness with hypotension: yes Has patient had a PCN reaction causing severe rash involving mucus membranes or skin necrosis: no Has patient had a PCN reaction that required hospitalization- yes already in the hospital Has patient had a PCN reaction occurring within the last 10 years: No If all of the above answers are "NO", then may proceed with Cephalosporin use.     Family History  Problem Relation  Age of Onset  . Family history unknown: Yes    Prior to Admission medications   Medication Sig Start Date End Date Taking? Authorizing Provider  ARMOUR THYROID 90 MG tablet Take 90 mg by mouth daily. 03/04/17  Yes [provider]  BETOPTIC-S 0.25 % ophthalmic suspension Place 1 drop into the right eye daily.  05/17/14  Yes [provider]  carbamazepine (TEGRETOL XR) 400 MG 12 hr tablet Take 1 tablet (400 mg total) by mouth 2 (two) times daily. 10/20/15  Yes Pollina, Gwenyth Allegra, MD  dorzolamide (TRUSOPT) 2 % ophthalmic solution Place 1 drop into the right eye every 12 (twelve) hours.  05/16/14  Yes [provider]  latanoprost (XALATAN) 0.005 % ophthalmic solution Place 1 drop into the right eye at bedtime.  04/19/14  Yes [provider]  mirabegron ER (MYRBETRIQ) 50 MG TB24 tablet Take 50 mg by mouth every evening.   Yes [provider]    Physical Exam: Vitals:   03/24/17 5885 03/24/17 1920 03/24/17 2130 03/24/17 2230  BP: (!) 157/98 (!) 153/79 117/74 132/81  Pulse: 70 77 63 84  Resp: 14 20 11 14   Temp: 97.7 F (36.5 C)     TempSrc: Oral     SpO2: 98% 99% 100% 100%      Constitutional: Poorly built and nourished. Vitals:   03/24/17 1704 03/24/17 1920 03/24/17 2130 03/24/17 2230  BP: (!) 157/98 (!) 153/79 117/74 132/81  Pulse: 70 77 63 84  Resp: 14 20 11  14  Temp: 97.7 F (36.5 C)     TempSrc: Oral     SpO2: 98% 99% 100% 100%   Eyes: Anicteric no pallor. ENMT: No discharge from the ears eyes nose and mouth. Neck: No mass felt. No neck rigidity. Respiratory: No rhonchi or crepitations. Cardiovascular: S1-S2 heard no murmurs appreciated. Abdomen: Diffuse tenderness no guarding or rigidity. Musculoskeletal: No edema. Skin: No rash. Neurologic: Patient appears confused and further exam difficult. Psychiatric: Patient appears confused.   Labs on Admission: I have personally reviewed following labs and imaging  studies  CBC:  Recent Labs Lab 03/24/17 1815  WBC 9.1  NEUTROABS 7.7  HGB 13.3  HCT 38.9*  MCV 87.6  PLT 921*   Basic Metabolic Panel:  Recent Labs Lab 03/24/17 1815  NA 144  K 5.3*  CL 109  CO2 23  GLUCOSE 100*  BUN 74*  CREATININE 4.65*  CALCIUM 9.8   GFR: CrCl cannot be calculated (Unknown ideal weight.). Liver Function Tests:  Recent Labs Lab 03/24/17 1815  AST 46*  ALT 38  ALKPHOS 73  BILITOT 0.4  PROT 7.5  ALBUMIN 4.1   No results for input(s): LIPASE, AMYLASE in the last 168 hours. No results for input(s): AMMONIA in the last 168 hours. Coagulation Profile: No results for input(s): INR, PROTIME in the last 168 hours. Cardiac Enzymes: No results for input(s): CKTOTAL, CKMB, CKMBINDEX, TROPONINI in the last 168 hours. BNP (last 3 results) No results for input(s): PROBNP in the last 8760 hours. HbA1C: No results for input(s): HGBA1C in the last 72 hours. CBG: No results for input(s): GLUCAP in the last 168 hours. Lipid Profile: No results for input(s): CHOL, HDL, LDLCALC, TRIG, CHOLHDL, LDLDIRECT in the last 72 hours. Thyroid Function Tests: No results for input(s): TSH, T4TOTAL, FREET4, T3FREE, THYROIDAB in the last 72 hours. Anemia Panel: No results for input(s): VITAMINB12, FOLATE, FERRITIN, TIBC, IRON, RETICCTPCT in the last 72 hours. Urine analysis:    Component Value Date/Time   COLORURINE YELLOW 03/24/2017 2022   APPEARANCEUR CLEAR 03/24/2017 2022   LABSPEC 1.023 03/24/2017 2022   PHURINE 5.0 03/24/2017 2022   GLUCOSEU NEGATIVE 03/24/2017 2022   HGBUR SMALL (A) 03/24/2017 2022   BILIRUBINUR NEGATIVE 03/24/2017 2022   KETONESUR NEGATIVE 03/24/2017 2022   PROTEINUR NEGATIVE 03/24/2017 2022   NITRITE NEGATIVE 03/24/2017 2022   LEUKOCYTESUR NEGATIVE 03/24/2017 2022   Sepsis Labs: @LABRCNTIP (procalcitonin:4,lacticidven:4) )No results found for this or any previous visit (from the past 240 hour(s)).   Radiological Exams on  Admission: Ct Abdomen Pelvis Wo Contrast  Result Date: 03/24/2017 CLINICAL DATA:  Lower abdominal pain. Last bowel movement was loose x2 days ago. EXAM: CT ABDOMEN AND PELVIS WITHOUT CONTRAST TECHNIQUE: Multidetector CT imaging of the abdomen and pelvis was performed following the standard protocol without IV contrast. COMPARISON:  MRI of the abdomen from 09/29/2010 FINDINGS: Lower chest: Normal sized cardiac chambers without pericardial effusion. Bullous emphysematous changes are seen in the left lower lobe. No pneumonic consolidation. No dominant mass. Hepatobiliary: Cholelithiasis without biliary dilatation. No space-occupying mass of the liver is identified on this unenhanced study. Pancreas: Normal calibered pancreatic duct without focal pancreatic mass. Spleen: No splenomegaly. Adrenals/Urinary Tract: The adrenals and kidneys are normal in appearance. No obstructive uropathy. Thick walled nondistended bladder containing a Foley catheter is noted traversing an enlarged prostate gland measuring up to 6.3 cm in diameter. Small air-fluid level seen within the bladder likely from instrumentation. There appears be left-sided bladder diverticulum. Stomach/Bowel: Suboptimal assessment due to lack of  oral and IV contrast. Soft tissue masslike abnormality in the left upper quadrant measuring approximately 4.6 cm in diameter may correspond with the lesion seen on prior MRI in 2011 measuring approximately 4.5 x 3.6 x 3.6 cm previously further characterization is limited. A moderate amount of stool is seen within large bowel. The rectosigmoid appears moderately thickened and inflamed consistent with a proctocolitis. Vascular/Lymphatic: Aortoiliac atherosclerosis without aneurysm. No pathologically enlarged lymph nodes. Reproductive: Enlarged prostate measuring up to 6.3 cm in diameter. Other: A moderate amount of ascites is seen within the abdomen and pelvis. Mild anasarca. Musculoskeletal: Chronic appearing mild T11  and T12 compression fractures with degenerative disc disease L2 through L4. Anterolisthesis of L5 on S1 grade. IMPRESSION: 1. Limited study given lack of IV and oral contrast. Left upper quadrant masslike abnormality is suspected as before currently estimated at 4.6 cm in diameter and slightly larger than prior. Epicenter may be associated with the stomach possibly representing a GIST tumor. 2. Thickened rectosigmoid suspicious for proctocolitis. 3. Mild anasarca with small to moderate volume of abdominopelvic ascites. 4. Enlarged prostate. 5. Thickened appearing bladder with left-sided bladder diverticulum may be secondary to a chronic cystitis. 6. Uncomplicated cholelithiasis. Electronically Signed   By: Ashley Royalty M.D.   On: 03/24/2017 20:58   Dg Abd Acute W/chest  Result Date: 03/24/2017 CLINICAL DATA:  Lower abdominal pain. EXAM: DG ABDOMEN ACUTE W/ 1V CHEST COMPARISON:  None. FINDINGS: No pneumothorax. There is a tortuous thoracic aorta. The heart, hila, and mediastinum are otherwise unremarkable. Mild opacity is identified in the left base. A skin fold overlies left upper chest. No free air, portal venous gas, or pneumatosis. Degenerative changes are seen in the spine. No bowel obstruction identified. No free air on the right side up decubitus film. IMPRESSION: 1. Mild opacity in the left lung base could represent atelectasis or early infiltrate. Recommend clinical correlation and follow-up as clinically warranted. 2. No acute abnormalities seen within the abdomen. Electronically Signed   By: Dorise Bullion III M.D   On: 03/24/2017 17:53    EKG: Independently reviewed. Normal sinus rhythm turned  Assessment/Plan Principal Problem:   ARF (acute renal failure) (HCC) Active Problems:   Seizure (HCC)   Abdominal pain   #1. Abdominal pain - cause not clear. Since CAT scan shows possible proctocolitis I have placed patient on Cipro and Flagyl. There is also concern for the left upper quadrant  mass. Will need to discuss with patient's family about if they want any further aggressive measures for the mass when family available. For now will keep patient nothing by mouth and on IV fluids. #2. Acute renal failure - probably prerenal from poor oral intake. For now we will hydrate and closely follow metabolic panel intake output. Patient was given 1 dose of IV bicarbonate and calcium for hyperkalemia. No EKG changes. #3. History of seizures - since patient is nothing by mouth I have discussed with the on-call neurologist Dr. Wallie Char. Since we cannot give carbamazepine while being nothing by mouth I have placed patient on IV Depakote after discussing with Dr. Wallie Char until patient can go back on carbamazepine. #4. History of dementia. #5. Thrombocytopenia.  DVT prophylaxis:  SCDs. Code Status:  DO NOT RESUSCITATE.  Family Communication:  Unable to reach family.  Disposition Plan:  To be determined.  Consults called:  Palliative care.  Admission status:  Inpatient.    Rise Patience MD [facility] Pager [phone].  If 7PM-7AM, please contact night-coverage [email or web address] Password  [email or web address]  03/24/2017, 11:02 PM

## 2017-03-24 NOTE — ED Notes (Signed)
Unsuccessful IV attempt x2.  

## 2017-03-24 NOTE — ED Notes (Signed)
Pt sleeping.   No distress noted.

## 2017-03-24 NOTE — ED Notes (Signed)
MD made aware of loss of access

## 2017-03-24 NOTE — ED Notes (Signed)
MD at bedside attempting IV placement.

## 2017-03-25 DIAGNOSIS — R569 Unspecified convulsions: Secondary | ICD-10-CM

## 2017-03-25 DIAGNOSIS — N179 Acute kidney failure, unspecified: Principal | ICD-10-CM

## 2017-03-25 DIAGNOSIS — R627 Adult failure to thrive: Secondary | ICD-10-CM

## 2017-03-25 DIAGNOSIS — F0150 Vascular dementia without behavioral disturbance: Secondary | ICD-10-CM

## 2017-03-25 DIAGNOSIS — K529 Noninfective gastroenteritis and colitis, unspecified: Secondary | ICD-10-CM

## 2017-03-25 LAB — BASIC METABOLIC PANEL
ANION GAP: 9 (ref 5–15)
BUN: 60 mg/dL — ABNORMAL HIGH (ref 6–20)
CALCIUM: 9.5 mg/dL (ref 8.9–10.3)
CHLORIDE: 111 mmol/L (ref 101–111)
CO2: 23 mmol/L (ref 22–32)
Creatinine, Ser: 2.57 mg/dL — ABNORMAL HIGH (ref 0.61–1.24)
GFR calc Af Amer: 25 mL/min — ABNORMAL LOW (ref >60)
GFR calc non Af Amer: 21 mL/min — ABNORMAL LOW (ref >60)
GLUCOSE: 79 mg/dL (ref 65–99)
Potassium: 4.1 mmol/L (ref 3.5–5.1)
Sodium: 143 mmol/L (ref 135–145)

## 2017-03-25 LAB — CBC WITH DIFFERENTIAL/PLATELET
BASOS PCT: 0 %
Basophils Absolute: 0 10*3/uL (ref 0.0–0.1)
Eosinophils Absolute: 0.1 10*3/uL (ref 0.0–0.7)
Eosinophils Relative: 1 %
HEMATOCRIT: 34.3 % — AB (ref 39.0–52.0)
HEMOGLOBIN: 12.1 g/dL — AB (ref 13.0–17.0)
LYMPHS ABS: 1.2 10*3/uL (ref 0.7–4.0)
Lymphocytes Relative: 18 %
MCH: 30.6 pg (ref 26.0–34.0)
MCHC: 35.3 g/dL (ref 30.0–36.0)
MCV: 86.6 fL (ref 78.0–100.0)
MONOS PCT: 9 %
Monocytes Absolute: 0.6 10*3/uL (ref 0.1–1.0)
NEUTROS ABS: 4.8 10*3/uL (ref 1.7–7.7)
Neutrophils Relative %: 71 %
Platelets: 115 10*3/uL — ABNORMAL LOW (ref 150–400)
RBC: 3.96 MIL/uL — ABNORMAL LOW (ref 4.22–5.81)
RDW: 13.6 % (ref 11.5–15.5)
WBC: 6.7 10*3/uL (ref 4.0–10.5)

## 2017-03-25 MED ORDER — BISACODYL 10 MG RE SUPP
10.0000 mg | Freq: Every day | RECTAL | Status: AC
  Administered 2017-03-25 – 2017-03-27 (×3): 10 mg via RECTAL
  Filled 2017-03-25 (×3): qty 1

## 2017-03-25 MED ORDER — METRONIDAZOLE IN NACL 5-0.79 MG/ML-% IV SOLN
500.0000 mg | Freq: Three times a day (TID) | INTRAVENOUS | Status: DC
  Administered 2017-03-25 – 2017-03-27 (×6): 500 mg via INTRAVENOUS
  Filled 2017-03-25 (×7): qty 100

## 2017-03-25 MED ORDER — THYROID 60 MG PO TABS
90.0000 mg | ORAL_TABLET | Freq: Every day | ORAL | Status: DC
  Administered 2017-03-26 – 2017-03-28 (×3): 90 mg via ORAL
  Filled 2017-03-25 (×3): qty 1

## 2017-03-25 MED ORDER — THYROID 60 MG PO TABS: 90.0000 mg | ORAL_TABLET | Freq: Every day | ORAL | Status: DC

## 2017-03-25 MED ORDER — SENNOSIDES-DOCUSATE SODIUM 8.6-50 MG PO TABS
1.0000 | ORAL_TABLET | Freq: Two times a day (BID) | ORAL | Status: DC
  Administered 2017-03-25 – 2017-03-28 (×6): 1 via ORAL
  Filled 2017-03-25 (×6): qty 1

## 2017-03-25 MED ORDER — POLYETHYLENE GLYCOL 3350 17 G PO PACK
17.0000 g | PACK | Freq: Every day | ORAL | Status: DC
  Administered 2017-03-26 – 2017-03-28 (×3): 17 g via ORAL
  Filled 2017-03-25 (×3): qty 1

## 2017-03-25 MED ORDER — CARBAMAZEPINE ER 200 MG PO TB12
400.0000 mg | ORAL_TABLET | Freq: Two times a day (BID) | ORAL | Status: DC
  Administered 2017-03-25 – 2017-03-28 (×6): 400 mg via ORAL
  Filled 2017-03-25 (×7): qty 2

## 2017-03-25 MED ORDER — CIPROFLOXACIN IN D5W 400 MG/200ML IV SOLN
400.0000 mg | INTRAVENOUS | Status: AC
  Administered 2017-03-25 – 2017-03-26 (×2): 400 mg via INTRAVENOUS
  Filled 2017-03-25 (×2): qty 200

## 2017-03-25 NOTE — Evaluation (Signed)
Clinical/Bedside Swallow Evaluation Patient Details  Name: DETAVIOUS Massey MRN: [id number] Date of Birth: 1929-07-12  Today's Date: 03/25/2017 Time: SLP Start Time (ACUTE ONLY): 1645 SLP Stop Time (ACUTE ONLY): 1710 SLP Time Calculation (min) (ACUTE ONLY): 25 min  Past Medical History:  Past Medical History:  Diagnosis Date  . Alzheimer disease   . Seizures ([place])    Past Surgical History:  Past Surgical History:  Procedure Laterality Date  . KNEE SURGERY     HPI:  83 yo male adm to [facility] with abdomen pain - but no N/V.  PMH + for seizures, dementia.  Pt with possible proctocolitis and ? LUQ mass.  Palliative team involved.  Swallow evaluation ordered.     Assessment / Plan / Recommendation Clinical Impression  Pt presents with overall functional oropharyngeal swallow clinically evaluated. NO indications of airway compromise with po intake.  Pt does have minimal delayed mastication with oral residuals but he does independently clear with liquid washes.  Recommend regular/thin diet with general precautions. Intermittent supervision may be helpful to mitigate aspiration risk given pt's impulsivity. No SlP follow up indicated.  Thanks for this consult.  SLP Visit Diagnosis: Dysphagia, oropharyngeal phase (R13.12)    Aspiration Risk  Mild aspiration risk    Diet Recommendation Regular;Thin liquid   Liquid Administration via: Cup;Straw Medication Administration: Whole meds with liquid Supervision: Patient able to self feed;Intermittent supervision to cue for compensatory strategies Compensations: Slow rate;Small sips/bites Postural Changes: Seated upright at 90 degrees;Remain upright for at least 30 minutes after po intake    Other  Recommendations Oral Care Recommendations: Oral care BID   Follow up Recommendations None      Frequency and Duration            Prognosis        Swallow Study   General Date of Onset: 03/25/17 HPI: 83 yo male adm to [facility] with abdomen pain -  but no N/V.  PMH + for seizures, dementia.  Pt with possible proctocolitis and ? LUQ mass.  Palliative team involved.  Swallow evaluation ordered.   Type of Study: Bedside Swallow Evaluation Diet Prior to this Study: NPO Temperature Spikes Noted: No Respiratory Status: Nasal cannula History of Recent Intubation: No Behavior/Cognition: Alert;Cooperative;Other (Comment) (suspect pt is hoh) Oral Cavity Assessment: Other (comment) (oral cavity moist, ? appearance of fatty tissue posterior interior buccal region bilaterally, pt did not complain of pain with palpation) Oral Care Completed by SLP: No Oral Cavity - Dentition: Adequate natural dentition (few missing teeth) Vision: Functional for self-feeding Self-Feeding Abilities: Able to feed self Patient Positioning: Upright in bed Baseline Vocal Quality: Normal Volitional Cough: Strong Volitional Swallow: Able to elicit    Oral/Motor/Sensory Function Overall Oral Motor/Sensory Function: Other (comment) (suspect baseline left weakness, pt did not consistently follow directions, able to seal lips on cup/straw/spoon, lingual deviation slight and palatal elevation bilateral)   Ice Chips Ice chips: Within functional limits Presentation: Spoon   Thin Liquid Thin Liquid: Within functional limits Presentation: Cup;Self Fed;Straw    Nectar Thick Nectar Thick Liquid: Not tested   Honey Thick Honey Thick Liquid: Not tested   Puree Puree: Within functional limits Presentation: Self Fed;Spoon   Solid   GO   Solid: Impaired Oral Phase Impairments: Impaired mastication Oral Phase Functional Implications: Oral residue Other Comments: pt uses liquids to faciliate oral clearance of trace residuals        Luanna Salk, [place] [facility] SLP [phone]

## 2017-03-25 NOTE — Progress Notes (Signed)
Pharmacy Antibiotic Note  Erik Massey is a 83 y.o. male admitted on 03/24/2017 with abdominal pain and acute renal failure. Patient placed on Ciprofloxacin and Metronidazole per MD for possible proctocolitis, with request for pharmacy dosing assistance for Ciprofloxacin.   Plan: Ciprofloxacin 400mg  IV q24h. Monitor renal function, culture results as available, clinical course.   Height: 6' (182.9 cm) Weight: 119 lb 7.8 oz (54.2 kg) IBW/kg (Calculated) : 77.6  Temp (24hrs), Avg:97.7 F (36.5 C), Min:97.4 F (36.3 C), Max:97.9 F (36.6 C)   Recent Labs Lab 03/24/17 1815 03/25/17 0551  WBC 9.1 6.7  CREATININE 4.65* 2.57*    Estimated Creatinine Clearance: 16.1 mL/min (A) (by C-G formula based on SCr of 2.57 mg/dL (H)).    Allergies  Allergen Reactions  . Penicillins Swelling    Has patient had a PCN reaction causing immediate rash, facial/tongue/throat swelling, SOB or lightheadedness with hypotension: yes Has patient had a PCN reaction causing severe rash involving mucus membranes or skin necrosis: no Has patient had a PCN reaction that required hospitalization- yes already in the hospital Has patient had a PCN reaction occurring within the last 10 years: No If all of the above answers are "NO", then may proceed with Cephalosporin use.     Antimicrobials this admission: 5/18 >> Ciprofloxacin >> 5/18 >> Metronidazole (per MD) >>  Dose adjustments this admission: --  Microbiology results: None ordered  Thank you for allowing pharmacy to be a part of this patient's care.   Lindell Spar, PharmD, BCPS Pager: [phone] 03/25/2017 8:06 AM

## 2017-03-25 NOTE — Progress Notes (Signed)
No charge note.   Palliative consult request received.  Chart reviewed Patient is a sweet elderly gentleman resting in bed, he is able to state his name, how ever, is not able to participate much in discussions.  Call placed, unable to reach daughter [name], will re attempt tomorrow.  Agree with primary team's request for goals of care discussions, in light of new finding of LUQ mass, ? 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  Patient is currently getting treatment for proctocolitis.  Full note and further recommendations to follow.  Loistine Chance MD [facility] palliative medicine team [phone]

## 2017-03-25 NOTE — ED Notes (Signed)
IV patency confirmed with Charge Nurse Danise Mina Ox RN

## 2017-03-25 NOTE — Progress Notes (Signed)
PROGRESS NOTE  Erik Massey BWL:[id number] DOB: 07-Nov-1929 DOA: 03/24/2017 PCP: Glendale Chard, MD  HPI/Recap of past 24 hours:  Demented elderly not able to provide history, no fever, vital stable  Assessment/Plan: Principal Problem:   ARF (acute renal failure) ([place]) Active Problems:   Seizure ([place])   Abdominal pain  ARF: From dehydration and infection? No obstructive nephropathy by ct ab, ua does has rbc tntc, no bacteria Continue hydration, treat infection, renal dosing meds, avoid hypotension.  Thickened rectosigmoid suspicious for proctocolitis Continue cipro/flagyl  Thickened appearing bladder with left-sided bladder diverticulum may be secondary to a chronic cystitis. Enlarged prostate ua rbc tntc, no bactereia  H/o seizure : He received depacon initially due to npo status, now he passed swallow eval.   Left upper quadrant masslike abnormality is suspected as before currently estimated at 4.6 cm in diameter and slightly larger than prior. Epicenter may be associated with the stomach possibly representing a GIST tumor. I discussed with patient's daughter, she prefers conservative management.    COPD: stable, no wheezing  Dementia/ FTT/  malnutrition:  Body mass index is 16.21 kg/m. Mild anasarca with small to moderate volume of abdominopelvic ascites Nutrition consulted Daughter is open to snf placement, will get PT  Code Status: DNR  Family Communication: daughter in person  Disposition Plan: SNF   Consultants:  Palliative care  Procedures:  none  Antibiotics:  cipro/flagyl   Objective: BP (!) 142/76 (BP Location: Left Leg)   Pulse 74   Temp 97.9 F (36.6 C) (Oral)   Resp 18   Ht 6' (1.829 m)   Wt 54.2 kg (119 lb 7.8 oz)   SpO2 100%   BMI 16.21 kg/m   Intake/Output Summary (Last 24 hours) at 03/25/17 0850 Last data filed at 03/25/17 6811  Gross per 24 hour  Intake          1066.67 ml  Output             1450 ml  Net           -383.33 ml   Filed Weights   03/24/17 2339  Weight: 54.2 kg (119 lb 7.8 oz)    Exam:   General:  Frail, demented elderly, does follow commands inconsistently   Cardiovascular: RRR  Respiratory: CTABL  Abdomen: does seem to have diffuse tender, no guarding, positive BS  Musculoskeletal: No Edema  Neuro: demented  Data Reviewed: Basic Metabolic Panel:  Recent Labs Lab 03/24/17 1815 03/25/17 0551  NA 144 143  K 5.3* 4.1  CL 109 111  CO2 23 23  GLUCOSE 100* 79  BUN 74* 60*  CREATININE 4.65* 2.57*  CALCIUM 9.8 9.5   Liver Function Tests:  Recent Labs Lab 03/24/17 1815  AST 46*  ALT 38  ALKPHOS 73  BILITOT 0.4  PROT 7.5  ALBUMIN 4.1   No results for input(s): LIPASE, AMYLASE in the last 168 hours. No results for input(s): AMMONIA in the last 168 hours. CBC:  Recent Labs Lab 03/24/17 1815 03/25/17 0551  WBC 9.1 6.7  NEUTROABS 7.7 4.8  HGB 13.3 12.1*  HCT 38.9* 34.3*  MCV 87.6 86.6  PLT 125* 115*   Cardiac Enzymes:   No results for input(s): CKTOTAL, CKMB, CKMBINDEX, TROPONINI in the last 168 hours. BNP (last 3 results) No results for input(s): BNP in the last 8760 hours.  ProBNP (last 3 results) No results for input(s): PROBNP in the last 8760 hours.  CBG: No results for input(s): GLUCAP in  the last 168 hours.  No results found for this or any previous visit (from the past 240 hour(s)).   Studies: Ct Abdomen Pelvis Wo Contrast  Result Date: 03/24/2017 CLINICAL DATA:  Lower abdominal pain. Last bowel movement was loose x2 days ago. EXAM: CT ABDOMEN AND PELVIS WITHOUT CONTRAST TECHNIQUE: Multidetector CT imaging of the abdomen and pelvis was performed following the standard protocol without IV contrast. COMPARISON:  MRI of the abdomen from 09/29/2010 FINDINGS: Lower chest: Normal sized cardiac chambers without pericardial effusion. Bullous emphysematous changes are seen in the left lower lobe. No pneumonic consolidation. No dominant mass.  Hepatobiliary: Cholelithiasis without biliary dilatation. No space-occupying mass of the liver is identified on this unenhanced study. Pancreas: Normal calibered pancreatic duct without focal pancreatic mass. Spleen: No splenomegaly. Adrenals/Urinary Tract: The adrenals and kidneys are normal in appearance. No obstructive uropathy. Thick walled nondistended bladder containing a Foley catheter is noted traversing an enlarged prostate gland measuring up to 6.3 cm in diameter. Small air-fluid level seen within the bladder likely from instrumentation. There appears be left-sided bladder diverticulum. Stomach/Bowel: Suboptimal assessment due to lack of oral and IV contrast. Soft tissue masslike abnormality in the left upper quadrant measuring approximately 4.6 cm in diameter may correspond with the lesion seen on prior MRI in 2011 measuring approximately 4.5 x 3.6 x 3.6 cm previously further characterization is limited. A moderate amount of stool is seen within large bowel. The rectosigmoid appears moderately thickened and inflamed consistent with a proctocolitis. Vascular/Lymphatic: Aortoiliac atherosclerosis without aneurysm. No pathologically enlarged lymph nodes. Reproductive: Enlarged prostate measuring up to 6.3 cm in diameter. Other: A moderate amount of ascites is seen within the abdomen and pelvis. Mild anasarca. Musculoskeletal: Chronic appearing mild T11 and T12 compression fractures with degenerative disc disease L2 through L4. Anterolisthesis of L5 on S1 grade. IMPRESSION: 1. Limited study given lack of IV and oral contrast. Left upper quadrant masslike abnormality is suspected as before currently estimated at 4.6 cm in diameter and slightly larger than prior. Epicenter may be associated with the stomach possibly representing a GIST tumor. 2. Thickened rectosigmoid suspicious for proctocolitis. 3. Mild anasarca with small to moderate volume of abdominopelvic ascites. 4. Enlarged prostate. 5. Thickened  appearing bladder with left-sided bladder diverticulum may be secondary to a chronic cystitis. 6. Uncomplicated cholelithiasis. Electronically Signed   By: Ashley Royalty M.D.   On: 03/24/2017 20:58   Dg Abd Acute W/chest  Result Date: 03/24/2017 CLINICAL DATA:  Lower abdominal pain. EXAM: DG ABDOMEN ACUTE W/ 1V CHEST COMPARISON:  None. FINDINGS: No pneumothorax. There is a tortuous thoracic aorta. The heart, hila, and mediastinum are otherwise unremarkable. Mild opacity is identified in the left base. A skin fold overlies left upper chest. No free air, portal venous gas, or pneumatosis. Degenerative changes are seen in the spine. No bowel obstruction identified. No free air on the right side up decubitus film. IMPRESSION: 1. Mild opacity in the left lung base could represent atelectasis or early infiltrate. Recommend clinical correlation and follow-up as clinically warranted. 2. No acute abnormalities seen within the abdomen. Electronically Signed   By: Dorise Bullion III M.D   On: 03/24/2017 17:53    Scheduled Meds: . betaxolol  1 drop Right Eye Daily  . bisacodyl  10 mg Rectal Daily  . dorzolamide  1 drop Right Eye Q12H  . latanoprost  1 drop Right Eye QHS  . levothyroxine  75 mcg Intravenous Daily    Continuous Infusions: . sodium chloride 1,000 mL (  03/24/17 2057)  . ciprofloxacin    . metronidazole    . valproate sodium Stopped (03/25/17 0156)     Time spent: 59mins  Kyiah Canepa MD, PhD  [facility] Pager [phone]. If 7PM-7AM, please contact night-coverage at [email or web address], password [facility] 03/25/2017, 8:50 AM  LOS: 1 day

## 2017-03-25 NOTE — Progress Notes (Signed)
Nutrition Brief Note  Pt identified on the </= 12 Braden score report. Pt with Alzheimer's dementia.  NPO.  Swallow evaluation pending. Palliative Care Team consulted for goals of care.  No nutrition interventions warranted at this time.  Please consult as needed.   Arthur Holms, RD, LDN Pager #: [phone] After-Hours Pager #: [phone]

## 2017-03-26 DIAGNOSIS — R1012 Left upper quadrant pain: Secondary | ICD-10-CM

## 2017-03-26 LAB — COMPREHENSIVE METABOLIC PANEL
ALBUMIN: 2.9 g/dL — AB (ref 3.5–5.0)
ALK PHOS: 53 U/L (ref 38–126)
ALT: 31 U/L (ref 17–63)
ANION GAP: 7 (ref 5–15)
AST: 41 U/L (ref 15–41)
BILIRUBIN TOTAL: 0.6 mg/dL (ref 0.3–1.2)
BUN: 30 mg/dL — AB (ref 6–20)
CALCIUM: 8.4 mg/dL — AB (ref 8.9–10.3)
CO2: 24 mmol/L (ref 22–32)
CREATININE: 1.19 mg/dL (ref 0.61–1.24)
Chloride: 110 mmol/L (ref 101–111)
GFR calc Af Amer: >60 mL/min (ref >60)
GFR calc non Af Amer: 54 mL/min — ABNORMAL LOW (ref >60)
GLUCOSE: 76 mg/dL (ref 65–99)
Potassium: 3.7 mmol/L (ref 3.5–5.1)
SODIUM: 141 mmol/L (ref 135–145)
TOTAL PROTEIN: 5.2 g/dL — AB (ref 6.5–8.1)

## 2017-03-26 LAB — CBC WITH DIFFERENTIAL/PLATELET
BASOS ABS: 0 10*3/uL (ref 0.0–0.1)
BASOS PCT: 0 %
EOS ABS: 0.2 10*3/uL (ref 0.0–0.7)
Eosinophils Relative: 5 %
HEMATOCRIT: 31.5 % — AB (ref 39.0–52.0)
HEMOGLOBIN: 10.9 g/dL — AB (ref 13.0–17.0)
Lymphocytes Relative: 15 %
Lymphs Abs: 0.8 10*3/uL (ref 0.7–4.0)
MCH: 30.4 pg (ref 26.0–34.0)
MCHC: 34.6 g/dL (ref 30.0–36.0)
MCV: 88 fL (ref 78.0–100.0)
MONOS PCT: 10 %
Monocytes Absolute: 0.5 10*3/uL (ref 0.1–1.0)
NEUTROS ABS: 3.6 10*3/uL (ref 1.7–7.7)
NEUTROS PCT: 70 %
Platelets: 122 10*3/uL — ABNORMAL LOW (ref 150–400)
RBC: 3.58 MIL/uL — AB (ref 4.22–5.81)
RDW: 13.4 % (ref 11.5–15.5)
WBC: 5.1 10*3/uL (ref 4.0–10.5)

## 2017-03-26 LAB — GLUCOSE, CAPILLARY
GLUCOSE-CAPILLARY: 77 mg/dL (ref 65–99)
GLUCOSE-CAPILLARY: 99 mg/dL (ref 65–99)
Glucose-Capillary: 80 mg/dL (ref 65–99)
Glucose-Capillary: 93 mg/dL (ref 65–99)

## 2017-03-26 LAB — PROTIME-INR
INR: 1.08
Prothrombin Time: 14.1 seconds (ref 11.4–15.2)

## 2017-03-26 LAB — TSH: TSH: 6.516 u[IU]/mL — AB (ref 0.350–4.500)

## 2017-03-26 LAB — CARBAMAZEPINE LEVEL, TOTAL: Carbamazepine Lvl: 3.4 ug/mL — ABNORMAL LOW (ref 4.0–12.0)

## 2017-03-26 LAB — T4, FREE: FREE T4: 0.74 ng/dL (ref 0.61–1.12)

## 2017-03-26 MED ORDER — CIPROFLOXACIN IN D5W 400 MG/200ML IV SOLN
400.0000 mg | Freq: Two times a day (BID) | INTRAVENOUS | Status: DC
  Administered 2017-03-27: 400 mg via INTRAVENOUS
  Filled 2017-03-26: qty 200

## 2017-03-26 MED ORDER — MORPHINE SULFATE (PF) 4 MG/ML IV SOLN: 1.0000 mg | INTRAVENOUS | Status: DC | PRN

## 2017-03-26 NOTE — Clinical Social Work Note (Signed)
Clinical Social Work Assessment  Patient Details  Name: Erik Massey MRN: [id number] Date of Birth: 1928/12/29  Date of referral:  03/26/17               Reason for consult:  Facility Placement                Permission sought to share information with:  Family Supports Permission granted to share information::     Name::     [name] Shelteron/ [place]::     Relationship::  Spouse  Contact Information:   [phone]  Housing/Transportation Living arrangements for the past 2 months:  [place] of Information:  Patient Patient Interpreter Needed:  None Criminal Activity/Legal Involvement Pertinent to Current Situation/Hospitalization:  No - Comment as needed Significant Relationships:  Adult Children, Spouse Lives with:  Spouse Do you feel safe going back to the place where you live?  Yes Need for family participation in patient care:  Yes (Comment)  Care giving concerns:  SNF placement.      Social Worker assessment / plan:  CSW met with patient and daughter at bedside, patient resting. Daughter provided information and requesting SNF placement and understands PT is pending. She reports the patient has been to a [facility] in the past for SNF placement.  CSW informed daughter at this time, [facility] not have bed availability.  Patient agreeable to [place]. CSW discussed faxing out process. At this time PT is pending.   Employment status:  Retired Forensic scientist:  Medicare PT Recommendations:    Information / Referral to community resources:  [place]  Patient/Family's Response to care:  Agreeable  Patient/Family's Understanding of and Emotional Response to Diagnosis, Current Treatment, and Prognosis:  Patient family very involved and concerns about patient care. Palliative is following patient for further recommendations.   Emotional Assessment Appearance:    Attitude/Demeanor/Rapport:     Affect (typically observed):  Accepting Orientation:  Oriented to Self Alcohol / Substance use:  Not Applicable Psych involvement (Current and /or in the community):  No (Comment)  Discharge Needs  Concerns to be addressed:  Discharge Planning Concerns Readmission within the last 30 days:  No Current discharge risk:  None Barriers to Discharge:  Continued Medical Work up   [employer], LCSW 03/26/2017, 4:25 PM

## 2017-03-26 NOTE — Progress Notes (Signed)
Pharmacy Antibiotic Note  Erik Massey is a 83 y.o. male admitted on 03/24/2017 with abdominal pain and acute renal failure. Patient placed on Ciprofloxacin and Metronidazole per MD for possible proctocolitis, with request for pharmacy dosing assistance for Ciprofloxacin.   Plan: Adjust Ciprofloxacin to 400mg  IV q12h for improved renal function. Monitor renal function, culture results as available, clinical course.  Recommend converting to PO when clinically appropriate.   Height: 6' (182.9 cm) Weight: 123 lb 10.9 oz (56.1 kg) IBW/kg (Calculated) : 77.6  Temp (24hrs), Avg:98.3 F (36.8 C), Min:98.1 F (36.7 C), Max:98.6 F (37 C)   Recent Labs Lab 03/24/17 1815 03/25/17 0551 03/26/17 0546  WBC 9.1 6.7 5.1  CREATININE 4.65* 2.57* 1.19    Estimated Creatinine Clearance: 36 mL/min (by C-G formula based on SCr of 1.19 mg/dL).    Allergies  Allergen Reactions  . Penicillins Swelling    Has patient had a PCN reaction causing immediate rash, facial/tongue/throat swelling, SOB or lightheadedness with hypotension: yes Has patient had a PCN reaction causing severe rash involving mucus membranes or skin necrosis: no Has patient had a PCN reaction that required hospitalization- yes already in the hospital Has patient had a PCN reaction occurring within the last 10 years: No If all of the above answers are "NO", then may proceed with Cephalosporin use.     Antimicrobials this admission: 5/18 >> Ciprofloxacin >> 5/18 >> Metronidazole (per MD) >>  Dose adjustments this admission: 5/19: adjusted Ciprofloxacin from q24h to q12h for improved renal function  Microbiology results: None ordered  Thank you for allowing pharmacy to be a part of this patient's care.   Lindell Spar, PharmD, BCPS Pager: [phone] 03/26/2017 10:17 AM

## 2017-03-26 NOTE — Evaluation (Signed)
Physical Therapy Evaluation Patient Details Name: Erik Massey MRN: [id number] DOB: 31-Aug-1929 Today's Date: 03/26/2017   History of Present Illness  Erik Massey is a 83 y.o. male admitted on 03/24/2017 with abdominal pain and acute renal failure on 03/24/17. H/O seizures, dementia, TBI with right side weakness.  Clinical Impression  The patient  Appears to  Follow  And understand at times, patient is non verbal, does follow some commands. No family present to discuss DC plans and prior level of function. Pt admitted with above diagnosis. Pt currently with functional limitations due to the deficits listed below (see PT Problem List).  Pt will benefit from skilled PT to increase their independence and safety with mobility to allow discharge to the venue listed below.       Follow Up Recommendations SNF;Supervision/Assistance - 24 hour (per conversation MD had w./ family)    Equipment Recommendations  None recommended by PT    Recommendations for Other Services       Precautions / Restrictions Precautions Precautions: Fall      Mobility  Bed Mobility Overal bed mobility: Needs Assistance Bed Mobility: Supine to Sit;Sit to Supine     Supine to sit: Mod assist;HOB elevated Sit to supine: Mod assist   General bed mobility comments: assist with legs and trunk  Transfers Overall transfer level: Needs assistance Equipment used: 2 person hand held assist Transfers: Sit to/from Stand Sit to Stand: Max assist;+2 physical assistance;+2 safety/equipment         General transfer comment: assisted to stnad from bed, patient took small steps along the bed   Ambulation/Gait                Stairs            Wheelchair Mobility    Modified Rankin (Stroke Patients Only)       Balance Overall balance assessment: Needs assistance Sitting-balance support: Single extremity supported;Feet supported Sitting balance-Leahy Scale: Fair     Standing balance  support: During functional activity;Bilateral upper extremity supported Standing balance-Leahy Scale: Poor                               Pertinent Vitals/Pain Pain Assessment: No/denies pain    Home Living Family/patient expects to be discharged to:: Unsure                 Additional Comments: no family present, reportedly from home with 24/7 caregivers    Prior Function           Comments: from report, ambulated with hemiwalker     Hand Dominance        Extremity/Trunk Assessment   Upper Extremity Assessment Upper Extremity Assessment: RUE deficits/detail RUE Deficits / Details: hand drawn, arm stays close into abdomen, increased tone RUE Coordination: decreased fine motor;decreased gross motor         Cervical / Trunk Assessment Cervical / Trunk Assessment: Kyphotic  Communication   Communication: Receptive difficulties;Expressive difficulties  Cognition Arousal/Alertness: Awake/alert Behavior During Therapy: WFL for tasks assessed/performed Overall Cognitive Status: No family/caregiver present to determine baseline cognitive functioning                                 General Comments: does nod head no when asked simple questions for " are you in pain? " does  attempt tp to communicate with gestures while  assisted with standing. patient appeared frustrated in how PT was supporting him      General Comments      Exercises     Assessment/Plan    PT Assessment Patient needs continued PT services  PT Problem List Decreased strength;Decreased range of motion;Decreased activity tolerance;Decreased balance;Decreased mobility;Decreased safety awareness;Decreased knowledge of use of DME;Decreased knowledge of precautions;Decreased cognition       PT Treatment Interventions DME instruction;Gait training;Functional mobility training;Therapeutic activities;Patient/family education    PT Goals (Current goals can be found in the  Care Plan section)  Acute Rehab PT Goals PT Goal Formulation: Patient unable to participate in goal setting Time For Goal Achievement: 04/09/17 Potential to Achieve Goals: Fair    Frequency Min 3X/week   Barriers to discharge Decreased caregiver support      Co-evaluation               AM-PAC PT "6 Clicks" Daily Activity  Outcome Measure Difficulty turning over in bed (including adjusting bedclothes, sheets and blankets)?: A Lot Difficulty moving from lying on back to sitting on the side of the bed? : A Lot Difficulty sitting down on and standing up from a chair with arms (e.g., wheelchair, bedside commode, etc,.)?: A Lot Help needed moving to and from a bed to chair (including a wheelchair)?: A Lot Help needed walking in hospital room?: A Lot Help needed climbing 3-5 steps with a railing? : Total 6 Click Score: 11    End of Session   Activity Tolerance: Patient tolerated treatment well Patient left: in bed;with bed alarm set;with call bell/phone within reach Nurse Communication: Mobility status PT Visit Diagnosis: Difficulty in walking, not elsewhere classified (R26.2);Hemiplegia and hemiparesis Hemiplegia - Right/Left: Right Hemiplegia - caused by: Unspecified    Time: 7290-2111 PT Time Calculation (min) (ACUTE ONLY): 15 min   Charges:   PT Evaluation $PT Eval Low Complexity: 1 Procedure     PT G CodesTresa Endo PT [place] 03/26/2017, 3:45 PM

## 2017-03-26 NOTE — Progress Notes (Signed)
PROGRESS NOTE  Erik Massey YPP:[id number] DOB: 1929-04-21 DOA: 03/24/2017 PCP: Glendale Chard, MD  HPI/Recap of past 24 hours: Erik Massey is a 83 y.o. male with history of dementia, seizures was brought to the ER after patient was complaining of abdominal pain.  Pt grabbing stomach like he is in pain at times, especially with abdominal examination. Pt severely demented.    Assessment/Plan: Principal Problem:   ARF (acute renal failure) ([place]) Active Problems:   Seizure ([place])   Abdominal pain  ARF: Resolved now with IVF hydration.    From dehydration and infection with poor oral intake.   No obstructive nephropathy by ct ab, ua does has rbc tntc, no bacteria Continue hydration, treat infection, renal dosing meds, avoid hypotension.  Thickened rectosigmoid suspicious for proctocolitis Continue cipro/flagyl as he seems to be clinically improving.   Thickened appearing bladder with left-sided bladder diverticulum may be secondary to a chronic cystitis. Enlarged prostate ua rbc tntc, no bactereia  H/o seizure : He received depacon initially due to npo status, now he passed swallow eval. Resumed his oral home meds.   Left upper quadrant masslike abnormality is suspected as before currently estimated at 4.6 cm in diameter and slightly larger than prior. Epicenter may be associated with the stomach possibly representing a GIST tumor. I discussed with patient's daughter, she prefers conservative management.    Appreciate assistance of palliative medicine team.    COPD: stable, no wheezing  Dementia/ FTT/  malnutrition:  Body mass index is 16.77 kg/m. Mild anasarca with small to moderate volume of abdominopelvic ascites Nutrition consulted Daughter is open to snf placement, will get PT  Code Status: DNR  Family Communication: daughter in person  Disposition Plan: SNF  Consultants:  Palliative  care  Procedures:  none  Antibiotics:  cipro/flagyl   Objective: BP (!) 115/57 (BP Location: Left Arm)   Pulse 69   Temp 98.1 F (36.7 C) (Oral)   Resp 18   Ht 6' (1.829 m)   Wt 56.1 kg (123 lb 10.9 oz)   SpO2 99%   BMI 16.77 kg/m   Intake/Output Summary (Last 24 hours) at 03/26/17 0952 Last data filed at 03/26/17 0600  Gross per 24 hour  Intake             3695 ml  Output             1575 ml  Net             2120 ml   Filed Weights   03/24/17 2339 03/26/17 0515  Weight: 54.2 kg (119 lb 7.8 oz) 56.1 kg (123 lb 10.9 oz)    Exam:   General:  Frail, demented elderly, does follow commands inconsistently   Cardiovascular: RRR  Respiratory: CTABL  Abdomen: diffuse tenderness greater in LUQ, with guarding of LUQ, positive BS  Musculoskeletal: No Edema  Neuro: demented  Data Reviewed: Basic Metabolic Panel:  Recent Labs Lab 03/24/17 1815 03/25/17 0551 03/26/17 0546  NA 144 143 141  K 5.3* 4.1 3.7  CL 109 111 110  CO2 23 23 24   GLUCOSE 100* 79 76  BUN 74* 60* 30*  CREATININE 4.65* 2.57* 1.19  CALCIUM 9.8 9.5 8.4*   Liver Function Tests:  Recent Labs Lab 03/24/17 1815 03/26/17 0546  AST 46* 41  ALT 38 31  ALKPHOS 73 53  BILITOT 0.4 0.6  PROT 7.5 5.2*  ALBUMIN 4.1 2.9*   No results for input(s): LIPASE, AMYLASE in the last  168 hours. No results for input(s): AMMONIA in the last 168 hours. CBC:  Recent Labs Lab 03/24/17 1815 03/25/17 0551 03/26/17 0546  WBC 9.1 6.7 5.1  NEUTROABS 7.7 4.8 3.6  HGB 13.3 12.1* 10.9*  HCT 38.9* 34.3* 31.5*  MCV 87.6 86.6 88.0  PLT 125* 115* 122*   Cardiac Enzymes:   No results for input(s): CKTOTAL, CKMB, CKMBINDEX, TROPONINI in the last 168 hours. BNP (last 3 results) No results for input(s): BNP in the last 8760 hours.  ProBNP (last 3 results) No results for input(s): PROBNP in the last 8760 hours.  CBG:  Recent Labs Lab 03/26/17 0004 03/26/17 0506  GLUCAP 77 80    No results found for  this or any previous visit (from the past 240 hour(s)).   Studies: No results found.  Scheduled Meds: . betaxolol  1 drop Right Eye Daily  . bisacodyl  10 mg Rectal Daily  . carbamazepine  400 mg Oral BID  . dorzolamide  1 drop Right Eye Q12H  . latanoprost  1 drop Right Eye QHS  . polyethylene glycol  17 g Oral Daily  . senna-docusate  1 tablet Oral BID  . thyroid  90 mg Oral QAC breakfast    Continuous Infusions: . sodium chloride 100 mL/hr at 03/26/17 0322  . ciprofloxacin Stopped (03/25/17 1123)  . metronidazole Stopped (03/26/17 0222)   Time spent: 32 mins  Irwin Brakeman MD  [facility] Pager [phone]. If 7PM-7AM, please contact night-coverage at [email or web address], password [facility] 03/26/2017, 9:52 AM  LOS: 2 days

## 2017-03-26 NOTE — NC FL2 (Signed)
[place] LEVEL OF CARE SCREENING TOOL     IDENTIFICATION  Patient Name: Erik Massey Birthdate: 1929/08/12 Sex: male Admission Date (Current Location): 03/24/2017  [facility] and [place] Number:  Herbalist and Address:  [facility],  [place] [address] [place]      Provider Number: [id number]  Attending Physician Name and Address:  Murlean Iba, MD  Relative Name and Phone Number:       Current Level of Care: Hospital Recommended Level of Care: [place] Prior Approval Number:    Date Approved/Denied:   PASRR Number: [id number]  Discharge Plan: SNF    Current Diagnoses: Patient Active Problem List   Diagnosis Date Noted  . ARF (acute renal failure) ([place]) 03/24/2017  . Abdominal pain 03/24/2017  . Seizure ([place]) 10/20/2015  . UTI (lower urinary tract infection) 10/20/2015  . Falls 10/20/2015  . Cough 10/20/2015  . Physical deconditioning   . BPH (benign prostatic hyperplasia)     Orientation RESPIRATION BLADDER Height & Weight     Self  Normal Incontinent, Indwelling catheter Weight: 123 lb 10.9 oz (56.1 kg) Height:  6' (182.9 cm)  BEHAVIORAL SYMPTOMS/MOOD NEUROLOGICAL BOWEL NUTRITION STATUS      Continent Diet (Regular)  AMBULATORY STATUS COMMUNICATION OF NEEDS Skin     Verbally Normal                       Personal Care Assistance Level of Assistance  Bathing, Feeding, Dressing Bathing Assistance: Maximum assistance Feeding assistance: Independent Dressing Assistance: Maximum assistance     Functional Limitations Info  Sight, Hearing, Speech Sight Info: Adequate Hearing Info: Adequate Speech Info: Adequate    SPECIAL CARE FACTORS FREQUENCY  PT (By licensed PT)     PT Frequency: Min 3X/week              Contractures Contractures Info: Not present    Additional Factors Info  Code Status, Allergies Code Status Info: DNR Allergies Info: Penicillins            Current Medications (03/26/2017):  This is the current hospital active medication list Current Facility-Administered Medications  Medication Dose Route Frequency Provider Last Rate Last Dose  . 0.9 %  sodium chloride infusion   Intravenous Continuous Wynetta Emery, Clanford L, MD 60 mL/hr at 03/26/17 0954    . acetaminophen (TYLENOL) tablet 650 mg  650 mg Oral Q6H PRN Rise Patience, MD   650 mg at 03/26/17 1455   Or  . acetaminophen (TYLENOL) suppository 650 mg  650 mg Rectal Q6H PRN Rise Patience, MD      . betaxolol (BETOPTIC-S) 0.25 % ophthalmic suspension 1 drop  1 drop Right Eye Daily Rise Patience, MD   1 drop at 03/26/17 1122  . bisacodyl (DULCOLAX) suppository 10 mg  10 mg Rectal Daily Florencia Reasons, MD   10 mg at 03/26/17 1121  . carbamazepine (TEGRETOL XR) 12 hr tablet 400 mg  400 mg Oral BID Florencia Reasons, MD   400 mg at 03/26/17 1121  . ciprofloxacin (CIPRO) IVPB 400 mg  400 mg Intravenous Q12H Gadhia, Jigna M, RPH      . dorzolamide (TRUSOPT) 2 % ophthalmic solution 1 drop  1 drop Right Eye Q12H Rise Patience, MD   1 drop at 03/26/17 1122  . latanoprost (XALATAN) 0.005 % ophthalmic solution 1 drop  1 drop Right Eye QHS Rise Patience, MD   1  drop at 03/25/17 2327  . metroNIDAZOLE (FLAGYL) IVPB 500 mg  500 mg Intravenous Q8H Rise Patience, MD   Stopped at 03/26/17 1235  . morphine 4 MG/ML injection 1 mg  1 mg Intravenous Q3H PRN Johnson, Clanford L, MD      . polyethylene glycol (MIRALAX / GLYCOLAX) packet 17 g  17 g Oral Daily Florencia Reasons, MD   17 g at 03/26/17 1121  . senna-docusate (Senokot-S) tablet 1 tablet  1 tablet Oral BID Florencia Reasons, MD   1 tablet at 03/26/17 1122  . thyroid (ARMOUR) tablet 90 mg  90 mg Oral QAC breakfast Florencia Reasons, MD   90 mg at 03/26/17 1121     Discharge Medications: Please see discharge summary for a list of discharge medications.  Relevant Imaging Results:  Relevant Lab Results:   Additional Information SSN  [id number]  Lia Hopping, LCSW

## 2017-03-27 DIAGNOSIS — R338 Other retention of urine: Secondary | ICD-10-CM

## 2017-03-27 DIAGNOSIS — K5909 Other constipation: Secondary | ICD-10-CM

## 2017-03-27 DIAGNOSIS — N401 Enlarged prostate with lower urinary tract symptoms: Secondary | ICD-10-CM

## 2017-03-27 DIAGNOSIS — R339 Retention of urine, unspecified: Secondary | ICD-10-CM

## 2017-03-27 LAB — GLUCOSE, CAPILLARY
GLUCOSE-CAPILLARY: 74 mg/dL (ref 65–99)
GLUCOSE-CAPILLARY: 94 mg/dL (ref 65–99)
GLUCOSE-CAPILLARY: 155 mg/dL — AB (ref 65–99)
Glucose-Capillary: 87 mg/dL (ref 65–99)
Glucose-Capillary: 89 mg/dL (ref 65–99)

## 2017-03-27 LAB — CREATININE, SERUM
Creatinine, Ser: 1.14 mg/dL (ref 0.61–1.24)
GFR calc non Af Amer: 57 mL/min — ABNORMAL LOW (ref >60)

## 2017-03-27 MED ORDER — TAMSULOSIN HCL 0.4 MG PO CAPS: 0.4000 mg | ORAL_CAPSULE | Freq: Every day | ORAL | 0 refills | Status: DC

## 2017-03-27 MED ORDER — FINASTERIDE 5 MG PO TABS
5.0000 mg | ORAL_TABLET | Freq: Every day | ORAL | Status: DC
  Administered 2017-03-28: 5 mg via ORAL
  Filled 2017-03-27: qty 1

## 2017-03-27 MED ORDER — CIPROFLOXACIN HCL 500 MG PO TABS
500.0000 mg | ORAL_TABLET | Freq: Two times a day (BID) | ORAL | Status: DC
  Administered 2017-03-27 – 2017-03-28 (×3): 500 mg via ORAL
  Filled 2017-03-27 (×3): qty 1

## 2017-03-27 MED ORDER — POLYETHYLENE GLYCOL 3350 17 G PO PACK: 17.0000 g | PACK | Freq: Every day | ORAL | 0 refills | Status: DC

## 2017-03-27 MED ORDER — TAMSULOSIN HCL 0.4 MG PO CAPS
0.4000 mg | ORAL_CAPSULE | Freq: Every day | ORAL | Status: DC
  Administered 2017-03-27 – 2017-03-28 (×2): 0.4 mg via ORAL
  Filled 2017-03-27 (×2): qty 1

## 2017-03-27 MED ORDER — SENNOSIDES-DOCUSATE SODIUM 8.6-50 MG PO TABS: 1.0000 | ORAL_TABLET | Freq: Two times a day (BID) | ORAL | 0 refills | Status: DC

## 2017-03-27 MED ORDER — METRONIDAZOLE 500 MG PO TABS
500.0000 mg | ORAL_TABLET | Freq: Three times a day (TID) | ORAL | Status: DC
  Administered 2017-03-27 – 2017-03-28 (×4): 500 mg via ORAL
  Filled 2017-03-27 (×4): qty 1

## 2017-03-27 NOTE — Progress Notes (Signed)
16 Fr. Urethral Foley Catheter placed.  Patient tolerated well. Immediate return of over 340ml clear amber urine in drainage bag.  Will continue to monitor.

## 2017-03-27 NOTE — Progress Notes (Signed)
Patient voided 138ml clear yellow urine.  Bladder scan showed greater than 300 ml in bladder.  Will place foley per order from Dr. Erlinda Hong for urinary retention.

## 2017-03-27 NOTE — Progress Notes (Signed)
CSW attempted to contact patients daughter with no answer. CSW left voicemail for return call.   Kingsley Spittle, LCSWA Clinical Social Worker [phone]

## 2017-03-27 NOTE — Progress Notes (Signed)
PROGRESS NOTE  Erik Massey PFX:[id number] DOB: [birth year] DOA: 03/24/2017 PCP: Glendale Chard, MD  HPI/Recap of past 24 hours:  Demented elderly not able to provide history, no fever, vital stable  Assessment/Plan: Principal Problem:   ARF (acute renal failure) ([place]) Active Problems:   Seizure ([place])   Abdominal pain  ARF: From dehydration and infection? No obstructive nephropathy by ct ab, ua does has rbc tntc, no bacteria Continue hydration, treat infection, renal dosing meds, avoid hypotension. Cr normalized, continue hydration for another 24hrs.  Thickened rectosigmoid suspicious for proctocolitis He received iv cipro/flagyl, change to oral on 5/20  Constipation: chronic, increase stool softener  Urinary retention:  ua + blood, no bacteria Ct abdomen: no hydro, Thickened appearing bladder with left-sided bladder diverticulum may be secondary to a chronic cystitis. Enlarged prostate ua rbc tntc, no bactereia start flomax and proscar, treat constipation  failed voiding trial, will reinsert foley, snf to reattept voiding trial  H/o seizure : He received depacon initially due to npo status, now he passed swallow eval.   Left upper quadrant masslike abnormality is suspected as before currently estimated at 4.6 cm in diameter and slightly larger than prior. Epicenter may be associated with the stomach possibly representing a GIST tumor. Patient does has stomach pain after eating, per daughter this is chronic. I discussed with patient's daughter, she prefers conservative management.    COPD: stable, no wheezing  Hypothyroidism: continue supplement  Dementia/ FTT/  malnutrition:  Body mass index is 16.71 kg/m. Mild anasarca with small to moderate volume of abdominopelvic ascites Nutrition consulted Daughter is open to snf placement,  PT/SNF    Code Status: DNR  Family Communication: daughter over the phone  Disposition Plan:  SNF   Consultants:  Palliative care  Procedures:  none  Antibiotics:  cipro/flagyl   Objective: BP 125/63 (BP Location: Left Arm)   Pulse 73   Temp 98 F (36.7 C) (Axillary)   Resp 16   Ht 6' (1.829 m)   Wt 55.9 kg (123 lb 3.8 oz)   SpO2 98%   BMI 16.71 kg/m   Intake/Output Summary (Last 24 hours) at 03/27/17 1429 Last data filed at 03/27/17 0909  Gross per 24 hour  Intake             2563 ml  Output             3500 ml  Net             -937 ml   Filed Weights   03/24/17 2339 03/26/17 0515 03/27/17 0427  Weight: 54.2 kg (119 lb 7.8 oz) 56.1 kg (123 lb 10.9 oz) 55.9 kg (123 lb 3.8 oz)    Exam:   General:  Frail, demented elderly, does follow commands inconsistently   Cardiovascular: RRR  Respiratory: CTABL  Abdomen: diffuse tender seems has resolved, soft, no guarding, positive BS  Musculoskeletal: No Edema  Neuro: demented, right arm chronically contracted  Data Reviewed: Basic Metabolic Panel:  Recent Labs Lab 03/24/17 1815 03/25/17 0551 03/26/17 0546 03/27/17 0512  NA 144 143 141  --   K 5.3* 4.1 3.7  --   CL 109 111 110  --   CO2 23 23 24   --   GLUCOSE 100* 79 76  --   BUN 74* 60* 30*  --   CREATININE 4.65* 2.57* 1.19 1.14  CALCIUM 9.8 9.5 8.4*  --    Liver Function Tests:  Recent Labs Lab 03/24/17 1815 03/26/17 0546  AST 46* 41  ALT 38 31  ALKPHOS 73 53  BILITOT 0.4 0.6  PROT 7.5 5.2*  ALBUMIN 4.1 2.9*   No results for input(s): LIPASE, AMYLASE in the last 168 hours. No results for input(s): AMMONIA in the last 168 hours. CBC:  Recent Labs Lab 03/24/17 1815 03/25/17 0551 03/26/17 0546  WBC 9.1 6.7 5.1  NEUTROABS 7.7 4.8 3.6  HGB 13.3 12.1* 10.9*  HCT 38.9* 34.3* 31.5*  MCV 87.6 86.6 88.0  PLT 125* 115* 122*   Cardiac Enzymes:   No results for input(s): CKTOTAL, CKMB, CKMBINDEX, TROPONINI in the last 168 hours. BNP (last 3 results) No results for input(s): BNP in the last 8760 hours.  ProBNP (last 3  results) No results for input(s): PROBNP in the last 8760 hours.  CBG:  Recent Labs Lab 03/26/17 1138 03/26/17 1706 03/27/17 0020 03/27/17 0517 03/27/17 1149  GLUCAP 93 99 87 74 94    No results found for this or any previous visit (from the past 240 hour(s)).   Studies: No results found.  Scheduled Meds: . betaxolol  1 drop Right Eye Daily  . carbamazepine  400 mg Oral BID  . ciprofloxacin  500 mg Oral BID  . dorzolamide  1 drop Right Eye Q12H  . [START ON 03/28/2017] finasteride  5 mg Oral Daily  . latanoprost  1 drop Right Eye QHS  . metroNIDAZOLE  500 mg Oral Q8H  . polyethylene glycol  17 g Oral Daily  . senna-docusate  1 tablet Oral BID  . tamsulosin  0.4 mg Oral Daily  . thyroid  90 mg Oral QAC breakfast    Continuous Infusions: . sodium chloride 60 mL/hr at 03/27/17 1047     Time spent: 53mins  Shyvonne Chastang MD, PhD  [facility] Pager [phone]. If 7PM-7AM, please contact night-coverage at [email or web address], password [facility] 03/27/2017, 2:29 PM  LOS: 3 days

## 2017-03-28 DIAGNOSIS — R262 Difficulty in walking, not elsewhere classified: Secondary | ICD-10-CM | POA: Diagnosis not present

## 2017-03-28 DIAGNOSIS — K5900 Constipation, unspecified: Secondary | ICD-10-CM | POA: Diagnosis not present

## 2017-03-28 DIAGNOSIS — N179 Acute kidney failure, unspecified: Secondary | ICD-10-CM | POA: Diagnosis not present

## 2017-03-28 DIAGNOSIS — Z9911 Dependence on respirator [ventilator] status: Secondary | ICD-10-CM | POA: Diagnosis not present

## 2017-03-28 DIAGNOSIS — R100 Acute abdomen: Secondary | ICD-10-CM | POA: Diagnosis not present

## 2017-03-28 DIAGNOSIS — R531 Weakness: Secondary | ICD-10-CM | POA: Diagnosis not present

## 2017-03-28 DIAGNOSIS — Z87440 Personal history of urinary (tract) infections: Secondary | ICD-10-CM | POA: Diagnosis not present

## 2017-03-28 DIAGNOSIS — N189 Chronic kidney disease, unspecified: Secondary | ICD-10-CM | POA: Diagnosis not present

## 2017-03-28 DIAGNOSIS — Y732 Prosthetic and other implants, materials and accessory gastroenterology and urology devices associated with adverse incidents: Secondary | ICD-10-CM | POA: Diagnosis not present

## 2017-03-28 DIAGNOSIS — F0280 Dementia in other diseases classified elsewhere without behavioral disturbance: Secondary | ICD-10-CM | POA: Diagnosis present

## 2017-03-28 DIAGNOSIS — R1902 Left upper quadrant abdominal swelling, mass and lump: Secondary | ICD-10-CM | POA: Diagnosis not present

## 2017-03-28 DIAGNOSIS — R569 Unspecified convulsions: Secondary | ICD-10-CM | POA: Diagnosis not present

## 2017-03-28 DIAGNOSIS — L723 Sebaceous cyst: Secondary | ICD-10-CM | POA: Diagnosis not present

## 2017-03-28 DIAGNOSIS — R9431 Abnormal electrocardiogram [ECG] [EKG]: Secondary | ICD-10-CM | POA: Diagnosis not present

## 2017-03-28 DIAGNOSIS — Z7189 Other specified counseling: Secondary | ICD-10-CM | POA: Diagnosis not present

## 2017-03-28 DIAGNOSIS — A419 Sepsis, unspecified organism: Secondary | ICD-10-CM | POA: Diagnosis not present

## 2017-03-28 DIAGNOSIS — Z515 Encounter for palliative care: Secondary | ICD-10-CM | POA: Diagnosis not present

## 2017-03-28 DIAGNOSIS — Y95 Nosocomial condition: Secondary | ICD-10-CM | POA: Diagnosis present

## 2017-03-28 DIAGNOSIS — Z681 Body mass index (BMI) 19 or less, adult: Secondary | ICD-10-CM | POA: Diagnosis not present

## 2017-03-28 DIAGNOSIS — E039 Hypothyroidism, unspecified: Secondary | ICD-10-CM | POA: Diagnosis not present

## 2017-03-28 DIAGNOSIS — J9601 Acute respiratory failure with hypoxia: Secondary | ICD-10-CM | POA: Diagnosis not present

## 2017-03-28 DIAGNOSIS — R319 Hematuria, unspecified: Secondary | ICD-10-CM | POA: Diagnosis not present

## 2017-03-28 DIAGNOSIS — N390 Urinary tract infection, site not specified: Secondary | ICD-10-CM | POA: Diagnosis not present

## 2017-03-28 DIAGNOSIS — E162 Hypoglycemia, unspecified: Secondary | ICD-10-CM | POA: Diagnosis present

## 2017-03-28 DIAGNOSIS — B952 Enterococcus as the cause of diseases classified elsewhere: Secondary | ICD-10-CM | POA: Diagnosis not present

## 2017-03-28 DIAGNOSIS — K529 Noninfective gastroenteritis and colitis, unspecified: Secondary | ICD-10-CM | POA: Diagnosis not present

## 2017-03-28 DIAGNOSIS — T83518A Infection and inflammatory reaction due to other urinary catheter, initial encounter: Secondary | ICD-10-CM | POA: Diagnosis not present

## 2017-03-28 DIAGNOSIS — R488 Other symbolic dysfunctions: Secondary | ICD-10-CM | POA: Diagnosis not present

## 2017-03-28 DIAGNOSIS — D62 Acute posthemorrhagic anemia: Secondary | ICD-10-CM | POA: Diagnosis present

## 2017-03-28 DIAGNOSIS — E43 Unspecified severe protein-calorie malnutrition: Secondary | ICD-10-CM | POA: Diagnosis not present

## 2017-03-28 DIAGNOSIS — C49A0 Gastrointestinal stromal tumor, unspecified site: Secondary | ICD-10-CM | POA: Diagnosis not present

## 2017-03-28 DIAGNOSIS — G40909 Epilepsy, unspecified, not intractable, without status epilepticus: Secondary | ICD-10-CM | POA: Diagnosis present

## 2017-03-28 DIAGNOSIS — G309 Alzheimer's disease, unspecified: Secondary | ICD-10-CM | POA: Diagnosis not present

## 2017-03-28 DIAGNOSIS — R338 Other retention of urine: Secondary | ICD-10-CM | POA: Diagnosis not present

## 2017-03-28 DIAGNOSIS — R2681 Unsteadiness on feet: Secondary | ICD-10-CM | POA: Diagnosis not present

## 2017-03-28 DIAGNOSIS — D696 Thrombocytopenia, unspecified: Secondary | ICD-10-CM | POA: Diagnosis not present

## 2017-03-28 DIAGNOSIS — M6281 Muscle weakness (generalized): Secondary | ICD-10-CM | POA: Diagnosis not present

## 2017-03-28 DIAGNOSIS — D638 Anemia in other chronic diseases classified elsewhere: Secondary | ICD-10-CM | POA: Diagnosis not present

## 2017-03-28 DIAGNOSIS — E872 Acidosis: Secondary | ICD-10-CM | POA: Diagnosis present

## 2017-03-28 DIAGNOSIS — R031 Nonspecific low blood-pressure reading: Secondary | ICD-10-CM | POA: Diagnosis not present

## 2017-03-28 DIAGNOSIS — I959 Hypotension, unspecified: Secondary | ICD-10-CM | POA: Diagnosis not present

## 2017-03-28 DIAGNOSIS — F0150 Vascular dementia without behavioral disturbance: Secondary | ICD-10-CM | POA: Diagnosis not present

## 2017-03-28 DIAGNOSIS — Z8619 Personal history of other infectious and parasitic diseases: Secondary | ICD-10-CM | POA: Diagnosis not present

## 2017-03-28 DIAGNOSIS — K6289 Other specified diseases of anus and rectum: Secondary | ICD-10-CM

## 2017-03-28 DIAGNOSIS — R1310 Dysphagia, unspecified: Secondary | ICD-10-CM | POA: Diagnosis present

## 2017-03-28 DIAGNOSIS — J189 Pneumonia, unspecified organism: Secondary | ICD-10-CM | POA: Diagnosis not present

## 2017-03-28 DIAGNOSIS — R278 Other lack of coordination: Secondary | ICD-10-CM | POA: Diagnosis not present

## 2017-03-28 DIAGNOSIS — T83511A Infection and inflammatory reaction due to indwelling urethral catheter, initial encounter: Secondary | ICD-10-CM | POA: Diagnosis present

## 2017-03-28 DIAGNOSIS — R41841 Cognitive communication deficit: Secondary | ICD-10-CM | POA: Diagnosis not present

## 2017-03-28 DIAGNOSIS — N3020 Other chronic cystitis without hematuria: Secondary | ICD-10-CM | POA: Diagnosis not present

## 2017-03-28 DIAGNOSIS — R6521 Severe sepsis with septic shock: Secondary | ICD-10-CM | POA: Diagnosis present

## 2017-03-28 DIAGNOSIS — J690 Pneumonitis due to inhalation of food and vomit: Secondary | ICD-10-CM | POA: Diagnosis present

## 2017-03-28 DIAGNOSIS — R7881 Bacteremia: Secondary | ICD-10-CM | POA: Diagnosis not present

## 2017-03-28 DIAGNOSIS — Y846 Urinary catheterization as the cause of abnormal reaction of the patient, or of later complication, without mention of misadventure at the time of the procedure: Secondary | ICD-10-CM | POA: Diagnosis present

## 2017-03-28 DIAGNOSIS — Z66 Do not resuscitate: Secondary | ICD-10-CM | POA: Diagnosis present

## 2017-03-28 DIAGNOSIS — N401 Enlarged prostate with lower urinary tract symptoms: Secondary | ICD-10-CM | POA: Diagnosis not present

## 2017-03-28 DIAGNOSIS — R404 Transient alteration of awareness: Secondary | ICD-10-CM | POA: Diagnosis not present

## 2017-03-28 DIAGNOSIS — G9341 Metabolic encephalopathy: Secondary | ICD-10-CM | POA: Diagnosis present

## 2017-03-28 DIAGNOSIS — R197 Diarrhea, unspecified: Secondary | ICD-10-CM | POA: Diagnosis present

## 2017-03-28 LAB — CBC WITH DIFFERENTIAL/PLATELET
Basophils Absolute: 0 10*3/uL (ref 0.0–0.1)
Basophils Relative: 0 %
EOS ABS: 0.2 10*3/uL (ref 0.0–0.7)
EOS PCT: 4 %
HCT: 32.5 % — ABNORMAL LOW (ref 39.0–52.0)
HEMOGLOBIN: 11.5 g/dL — AB (ref 13.0–17.0)
Lymphocytes Relative: 17 %
Lymphs Abs: 0.9 10*3/uL (ref 0.7–4.0)
MCH: 30.6 pg (ref 26.0–34.0)
MCHC: 35.4 g/dL (ref 30.0–36.0)
MCV: 86.4 fL (ref 78.0–100.0)
Monocytes Absolute: 0.8 10*3/uL (ref 0.1–1.0)
Monocytes Relative: 15 %
NEUTROS PCT: 65 %
Neutro Abs: 3.5 10*3/uL (ref 1.7–7.7)
PLATELETS: 117 10*3/uL — AB (ref 150–400)
RBC: 3.76 MIL/uL — AB (ref 4.22–5.81)
RDW: 13.3 % (ref 11.5–15.5)
WBC: 5.5 10*3/uL (ref 4.0–10.5)

## 2017-03-28 LAB — BASIC METABOLIC PANEL
Anion gap: 6 (ref 5–15)
BUN: 20 mg/dL (ref 6–20)
CALCIUM: 7.8 mg/dL — AB (ref 8.9–10.3)
CO2: 24 mmol/L (ref 22–32)
Chloride: 106 mmol/L (ref 101–111)
Creatinine, Ser: 0.99 mg/dL (ref 0.61–1.24)
GFR calc non Af Amer: >60 mL/min (ref >60)
Glucose, Bld: 81 mg/dL (ref 65–99)
Potassium: 3.6 mmol/L (ref 3.5–5.1)
SODIUM: 136 mmol/L (ref 135–145)

## 2017-03-28 LAB — GLUCOSE, CAPILLARY
GLUCOSE-CAPILLARY: 75 mg/dL (ref 65–99)
GLUCOSE-CAPILLARY: 105 mg/dL — AB (ref 65–99)

## 2017-03-28 LAB — MAGNESIUM: Magnesium: 1.5 mg/dL — ABNORMAL LOW (ref 1.7–2.4)

## 2017-03-28 LAB — T3, FREE: T3, Free: 1.1 pg/mL — ABNORMAL LOW (ref 2.0–4.4)

## 2017-03-28 MED ORDER — METRONIDAZOLE 500 MG PO TABS: 500.0000 mg | ORAL_TABLET | Freq: Three times a day (TID) | ORAL | 0 refills | Status: AC

## 2017-03-28 MED ORDER — MAGNESIUM OXIDE 400 (241.3 MG) MG PO TABS: 400.0000 mg | ORAL_TABLET | Freq: Every day | ORAL | Status: DC

## 2017-03-28 MED ORDER — MAGNESIUM OXIDE 400 (241.3 MG) MG PO TABS: 400.0000 mg | ORAL_TABLET | Freq: Every day | ORAL | 0 refills | Status: DC

## 2017-03-28 MED ORDER — FINASTERIDE 5 MG PO TABS: 5.0000 mg | ORAL_TABLET | Freq: Every day | ORAL | 0 refills | Status: DC

## 2017-03-28 MED ORDER — MAGNESIUM SULFATE 2 GM/50ML IV SOLN
2.0000 g | Freq: Once | INTRAVENOUS | Status: AC
  Administered 2017-03-28: 2 g via INTRAVENOUS
  Filled 2017-03-28: qty 50

## 2017-03-28 MED ORDER — CIPROFLOXACIN HCL 500 MG PO TABS: 500.0000 mg | ORAL_TABLET | Freq: Two times a day (BID) | ORAL | 0 refills | Status: AC

## 2017-03-28 NOTE — Care Management Important Message (Signed)
Important Message  Patient Details  Name: Erik Massey MRN: [id number] Date of Birth: 09/08/1929   Medicare Important Message Given:  Yes    Kerin Salen 03/28/2017, 10:59 AMImportant Message  Patient Details  Name: Erik Massey MRN: [id number] Date of Birth: 01/09/1929   Medicare Important Message Given:  Yes    Kerin Salen 03/28/2017, 10:59 AM

## 2017-03-28 NOTE — Care Management Note (Signed)
Case Management Note  Patient Details  Name: Erik Massey MRN: [id number] Date of Birth: 1929/05/18  Subjective/Objective:  83 y/o m admitted w/ARF. CSW following for D/C SNF.                  Action/Plan:d/c SNF.   Expected Discharge Date:  03/28/17               Expected Discharge Plan:  Skilled Nursing Facility  In-House Referral:  Clinical Social Work  Discharge planning Services  CM Consult  Post Acute Care Choice:    Choice offered to:     DME Arranged:    DME Agency:     HH Arranged:    [place] Agency:     Status of Service:  Completed, signed off  If discussed at [employer], dates discussed:    Additional Comments:  Dessa Phi, RN 03/28/2017, 11:03 AM

## 2017-03-28 NOTE — Discharge Summary (Signed)
Discharge Summary  Erik Massey OVF:[id number] DOB: 07-01-1929  PCP: Glendale Chard, MD  Admit date: 03/24/2017 Discharge date: 03/28/2017  Time spent: >51mins  Recommendations for Outpatient Follow-up:  1. F/u with SNF MD within a week  for hospital discharge follow up, repeat cbc/bmp at follow up, voiding trial to be done at SNF 2. F/u with urology for urinary retention  Discharge Diagnoses:  Active Hospital Problems   Diagnosis Date Noted  . ARF (acute renal failure) ([place]) 03/24/2017  . Abdominal pain 03/24/2017  . Seizure ([place]) 10/20/2015    Resolved Hospital Problems   Diagnosis Date Noted Date Resolved  No resolved problems to display.    Discharge Condition: stable  Diet recommendation: regular diet  Filed Weights   03/26/17 0515 03/27/17 0427 03/28/17 0429  Weight: 56.1 kg (123 lb 10.9 oz) 55.9 kg (123 lb 3.8 oz) 56.7 kg (125 lb)    History of present illness:  PCP: Glendale Chard, MD  Patient coming from: Home.  Chief Complaint: Abdominal pain.  HPI: Erik Massey is a 83 y.o. male with history of dementia, seizures was brought to the ER after patient was complaining of abdominal pain. Most of the history is obtained from ER physician has patient family is not able to be reached. Patient is demented and does not provide history. As per the ER physician patient has been complaining of abdominal pain but did not have any nausea vomiting or diarrhea.   ED Course: On exam patient has diffuse abdominal tenderness. CT of the abdomen shows left upper quadrant mass which is chronic but enlarging in size. There is also concern for proctocolitis. Patient's labs reveal acute renal failure with mild hyperkalemia. Patient was given IV calcium and bicarbonate. Patient was placed on fluid bolus and admitted for acute renal failure and abdominal pain.  Hospital Course:  Principal Problem:   ARF (acute renal failure) ([facility]) Active Problems:   Seizure ([place])  Abdominal pain   ARF: From dehydration, urinary retention and infection?  No obstructive nephropathy by ct ab, ua does has rbc tntc, no bacteria Hydration, treat infection,foley placed,  renal dosing meds, avoid hypotension. Cr normalized at discharge.  Urinary retention:  ua + blood, no bacteria Ct abdomen: no hydro, Thickened appearing bladder with left-sided bladder diverticulum may be secondary to a chronic cystitis. Enlarged prostate ua rbc tntc, no bactereia start flomax and proscar, d/c myrbetriq, treat constipation, ,  failed voiding trial,  foley reinserted, snf to reattept voiding trial, urology follow up, daughter aware.   Hypomagnesemia: replace mag.  Thickened rectosigmoid suspicious for proctocolitis He received iv cipro/flagyl, change to oral on 5/20, discharge on cipro/flagyl to finish treatment course.  Constipation: chronic, increase stool softener   H/o seizure : He received depacon initially due to npo status, now he passed swallow eval. Home meds resumed.  Left upper quadrant masslike abnormality is suspected as before currently estimated at 4.6 cm in diameter and slightly larger than prior. Epicenter may be associated with the stomach possibly representing a GIST tumor. Patient does has stomach pain after eating, per daughter this is chronic. I discussed with patient's daughter, she prefers conservative management.    COPD: stable, no wheezing, no cough, on room air  Hypothyroidism: continue supplement  Dementia/ FTT/  malnutrition:  Body mass index is 16.71 kg/m. Mild anasarca with small to moderate volume of abdominopelvic ascites Nutrition consulted Daughter is open to snf placement,  PT/SNF    Code Status: DNR  Family Communication: daughter over  the phone  Disposition Plan: SNF   Consultants:  Palliative care  Procedures:  none  Antibiotics:  cipro/flagyl   Discharge Exam: BP 124/68 (BP Location: Left Arm)    Pulse 68   Temp 98.9 F (37.2 C) (Oral)   Resp 18   Ht 6' (1.829 m)   Wt 56.7 kg (125 lb)   SpO2 100%   BMI 16.95 kg/m     General:  Frail, demented elderly, does follow commands inconsistently , foley in place  Cardiovascular: RRR  Respiratory: CTABL  Abdomen: diffuse tender seems has resolved, soft, no guarding, positive BS  Musculoskeletal: No Edema  Neuro: demented, right arm chronically contracted   Discharge Instructions You were cared for by a hospitalist during your hospital stay. If you have any questions about your discharge medications or the care you received while you were in the hospital after you are discharged, you can call the unit and asked to speak with the hospitalist on call if the hospitalist that took care of you is not available. Once you are discharged, your primary care physician will handle any further medical issues. Please note that NO REFILLS for any discharge medications will be authorized once you are discharged, as it is imperative that you return to your primary care physician (or establish a relationship with a primary care physician if you do not have one) for your aftercare needs so that they can reassess your need for medications and monitor your lab values.  Discharge Instructions    Diet general    Complete by:  As directed    Increase activity slowly    Complete by:  As directed      Allergies as of 03/28/2017      Reactions   Penicillins Swelling   Has patient had a PCN reaction causing immediate rash, facial/tongue/throat swelling, SOB or lightheadedness with hypotension: yes Has patient had a PCN reaction causing severe rash involving mucus membranes or skin necrosis: no Has patient had a PCN reaction that required hospitalization- yes already in the hospital Has patient had a PCN reaction occurring within the last 10 years: No If all of the above answers are "NO", then may proceed with Cephalosporin use.      Medication List      STOP taking these medications   MYRBETRIQ 50 MG Tb24 tablet Generic drug:  mirabegron ER     TAKE these medications   ARMOUR THYROID 90 MG tablet Generic drug:  thyroid Take 90 mg by mouth daily.   BETOPTIC-S 0.25 % ophthalmic suspension Generic drug:  betaxolol Place 1 drop into the right eye daily.   carbamazepine 400 MG 12 hr tablet Commonly known as:  TEGRETOL XR Take 1 tablet (400 mg total) by mouth 2 (two) times daily.   ciprofloxacin 500 MG tablet Commonly known as:  CIPRO Take 1 tablet (500 mg total) by mouth 2 (two) times daily.   dorzolamide 2 % ophthalmic solution Commonly known as:  TRUSOPT Place 1 drop into the right eye every 12 (twelve) hours.   finasteride 5 MG tablet Commonly known as:  PROSCAR Take 1 tablet (5 mg total) by mouth daily. Start taking on:  03/29/2017   latanoprost 0.005 % ophthalmic solution Commonly known as:  XALATAN Place 1 drop into the right eye at bedtime.   magnesium oxide 400 (241.3 Mg) MG tablet Commonly known as:  MAG-OX Take 1 tablet (400 mg total) by mouth daily. Start taking on:  03/29/2017   metroNIDAZOLE 500  MG tablet Commonly known as:  FLAGYL Take 1 tablet (500 mg total) by mouth every 8 (eight) hours.   polyethylene glycol packet Commonly known as:  MIRALAX / GLYCOLAX Take 17 g by mouth daily.   senna-docusate 8.6-50 MG tablet Commonly known as:  Senokot-S Take 1 tablet by mouth 2 (two) times daily.   tamsulosin 0.4 MG Caps capsule Commonly known as:  FLOMAX Take 1 capsule (0.4 mg total) by mouth daily.      Allergies  Allergen Reactions  . Penicillins Swelling    Has patient had a PCN reaction causing immediate rash, facial/tongue/throat swelling, SOB or lightheadedness with hypotension: yes Has patient had a PCN reaction causing severe rash involving mucus membranes or skin necrosis: no Has patient had a PCN reaction that required hospitalization- yes already in the hospital Has patient had a PCN  reaction occurring within the last 10 years: No If all of the above answers are "NO", then may proceed with Cephalosporin use.     Contact information for follow-up providers    [place] Follow up in 2 week(s).   Why:  urinary retention Contact information: [address]  [place] [address] [place] [address] [phone]        SND MD for hospital discharge follow up,repeat cbc/bmp at follow up,voiding trial at SNF Follow up.            Contact information for after-discharge care    Destination    [facility] .   Specialty:  Skilled Nursing Facility Contact information: [place] [phone]                   The results of significant diagnostics from this hospitalization (including imaging, microbiology, ancillary and laboratory) are listed below for reference.    Significant Diagnostic Studies: Ct Abdomen Pelvis Wo Contrast  Result Date: 03/24/2017 CLINICAL DATA:  Lower abdominal pain. Last bowel movement was loose x2 days ago. EXAM: CT ABDOMEN AND PELVIS WITHOUT CONTRAST TECHNIQUE: Multidetector CT imaging of the abdomen and pelvis was performed following the standard protocol without IV contrast. COMPARISON:  MRI of the abdomen from 09/29/2010 FINDINGS: Lower chest: Normal sized cardiac chambers without pericardial effusion. Bullous emphysematous changes are seen in the left lower lobe. No pneumonic consolidation. No dominant mass. Hepatobiliary: Cholelithiasis without biliary dilatation. No space-occupying mass of the liver is identified on this unenhanced study. Pancreas: Normal calibered pancreatic duct without focal pancreatic mass. Spleen: No splenomegaly. Adrenals/Urinary Tract: The adrenals and kidneys are normal in appearance. No obstructive uropathy. Thick walled nondistended bladder containing a Foley catheter is noted traversing an enlarged prostate gland measuring up to 6.3 cm in diameter.  Small air-fluid level seen within the bladder likely from instrumentation. There appears be left-sided bladder diverticulum. Stomach/Bowel: Suboptimal assessment due to lack of oral and IV contrast. Soft tissue masslike abnormality in the left upper quadrant measuring approximately 4.6 cm in diameter may correspond with the lesion seen on prior MRI in 2011 measuring approximately 4.5 x 3.6 x 3.6 cm previously further characterization is limited. A moderate amount of stool is seen within large bowel. The rectosigmoid appears moderately thickened and inflamed consistent with a proctocolitis. Vascular/Lymphatic: Aortoiliac atherosclerosis without aneurysm. No pathologically enlarged lymph nodes. Reproductive: Enlarged prostate measuring up to 6.3 cm in diameter. Other: A moderate amount of ascites is seen within the abdomen and pelvis. Mild anasarca. Musculoskeletal: Chronic appearing mild T11 and T12 compression fractures with degenerative disc  disease L2 through L4. Anterolisthesis of L5 on S1 grade. IMPRESSION: 1. Limited study given lack of IV and oral contrast. Left upper quadrant masslike abnormality is suspected as before currently estimated at 4.6 cm in diameter and slightly larger than prior. Epicenter may be associated with the stomach possibly representing a GIST tumor. 2. Thickened rectosigmoid suspicious for proctocolitis. 3. Mild anasarca with small to moderate volume of abdominopelvic ascites. 4. Enlarged prostate. 5. Thickened appearing bladder with left-sided bladder diverticulum may be secondary to a chronic cystitis. 6. Uncomplicated cholelithiasis. Electronically Signed   By: Ashley Royalty M.D.   On: 03/24/2017 20:58   Dg Abd Acute W/chest  Result Date: 03/24/2017 CLINICAL DATA:  Lower abdominal pain. EXAM: DG ABDOMEN ACUTE W/ 1V CHEST COMPARISON:  None. FINDINGS: No pneumothorax. There is a tortuous thoracic aorta. The heart, hila, and mediastinum are otherwise unremarkable. Mild opacity is  identified in the left base. A skin fold overlies left upper chest. No free air, portal venous gas, or pneumatosis. Degenerative changes are seen in the spine. No bowel obstruction identified. No free air on the right side up decubitus film. IMPRESSION: 1. Mild opacity in the left lung base could represent atelectasis or early infiltrate. Recommend clinical correlation and follow-up as clinically warranted. 2. No acute abnormalities seen within the abdomen. Electronically Signed   By: Dorise Bullion III M.D   On: 03/24/2017 17:53    Microbiology: No results found for this or any previous visit (from the past 240 hour(s)).   Labs: Basic Metabolic Panel:  Recent Labs Lab 03/24/17 1815 03/25/17 0551 03/26/17 0546 03/27/17 0512 03/28/17 0513  NA 144 143 141  --  136  K 5.3* 4.1 3.7  --  3.6  CL 109 111 110  --  106  CO2 23 23 24   --  24  GLUCOSE 100* 79 76  --  81  BUN 74* 60* 30*  --  20  CREATININE 4.65* 2.57* 1.19 1.14 0.99  CALCIUM 9.8 9.5 8.4*  --  7.8*  MG  --   --   --   --  1.5*   Liver Function Tests:  Recent Labs Lab 03/24/17 1815 03/26/17 0546  AST 46* 41  ALT 38 31  ALKPHOS 73 53  BILITOT 0.4 0.6  PROT 7.5 5.2*  ALBUMIN 4.1 2.9*   No results for input(s): LIPASE, AMYLASE in the last 168 hours. No results for input(s): AMMONIA in the last 168 hours. CBC:  Recent Labs Lab 03/24/17 1815 03/25/17 0551 03/26/17 0546 03/28/17 0513  WBC 9.1 6.7 5.1 5.5  NEUTROABS 7.7 4.8 3.6 3.5  HGB 13.3 12.1* 10.9* 11.5*  HCT 38.9* 34.3* 31.5* 32.5*  MCV 87.6 86.6 88.0 86.4  PLT 125* 115* 122* 117*   Cardiac Enzymes: No results for input(s): CKTOTAL, CKMB, CKMBINDEX, TROPONINI in the last 168 hours. BNP: BNP (last 3 results) No results for input(s): BNP in the last 8760 hours.  ProBNP (last 3 results) No results for input(s): PROBNP in the last 8760 hours.  CBG:  Recent Labs Lab 03/27/17 0517 03/27/17 1149 03/27/17 1731 03/27/17 2348 03/28/17 0544  GLUCAP  74 94 155* 89 75       Signed:  Ilya Ess MD, PhD  [facility] 03/28/2017, 11:06 AM

## 2017-03-28 NOTE — Progress Notes (Signed)
Attempted to call report to [facility] Voicemail left for Phylis Bougie for questions. Stacey Drain

## 2017-03-28 NOTE — Clinical Social Work Placement (Signed)
Patient received and accepted bed offer at [facility]. [facility] contacted, family aware. Patient's RN can call report to [phone], patient going to room 807P.  CLINICAL SOCIAL WORK PLACEMENT  NOTE  Date:  03/28/2017  Patient Details  Name: Erik Massey MRN: [id number] Date of Birth: 1929/01/29  Clinical Social Work is seeking post-discharge placement for this patient at the [place] level of care (*CSW will initial, date and re-position this form in  chart as items are completed):  Yes   Patient/family provided with [facility] Work Department's list of facilities offering this level of care within the geographic area requested by the patient (or if unable, by the patient's family).  Yes   Patient/family informed of their freedom to choose among providers that offer the needed level of care, that participate in Medicare, Medicaid or managed care program needed by the patient, have an available bed and are willing to accept the patient.  Yes   Patient/family informed of [facility]'s ownership interest in [facility] and [facility], as well as of the fact that they are under no obligation to receive care at these facilities.  PASRR submitted to EDS on       PASRR number received on       Existing PASRR number confirmed on 03/26/17     FL2 transmitted to all facilities in geographic area requested by pt/family on 03/26/17     FL2 transmitted to all facilities within larger geographic area on       Patient informed that his/her managed care company has contracts with or will negotiate with certain facilities, including the following:        Yes   Patient/family informed of bed offers received.  Patient chooses bed at [facility]     Physician recommends and patient chooses bed at      Patient to be transferred to [facility] on 03/28/17.  Patient to be transferred to facility by [facility]     Patient family notified on 03/28/17 of  transfer.  Name of family member notified:  Daughter [name]      PHYSICIAN       Additional Comment:    _______________________________________________ Burnis Medin, LCSW 03/28/2017, 3:40 PM

## 2017-03-28 NOTE — Progress Notes (Signed)
Initial Nutrition Assessment  DOCUMENTATION CODES:   Underweight  INTERVENTION:  If pt unable to d/c today, RD will continue to monitor for needs and follow per protocol.  NUTRITION DIAGNOSIS:   Underweight related to chronic illness (Alzheimer's disease) as evidenced by other (see comment) (BMI 16.2 kg/m2).  GOAL:   Patient will meet greater than or equal to 90% of their needs  MONITOR:   PO intake, Weight trends, Labs  REASON FOR ASSESSMENT:   Consult Assessment of nutrition requirement/status  ASSESSMENT:    83 y.o. male with history of dementia, seizures was brought to the ER after patient was complaining of abdominal pain. Most of the history is obtained from ER physician has patient family is not able to be reached. Patient is demented and does not provide history. As per the ER physician patient has been complaining of abdominal pain but did not have any nausea vomiting or diarrhea.   Pt seen for consult. BMI indicates underweight status. Per chart review, pt consumed 75% of breakfast and 100% of lunch (1360 kcal and 47 grams of protein from these two meals) on 5/19 and 100% of breakfast yesterday (970 kcal and 32 grams of protein). No intakes documented from today. RN reports that tech fed pt and she heard tech providing pt with encouragement throughout that meal; tech has been tied up with 2 other patients since RD arrived on the floor. RN reports pt is to d/c to a facility today. Discharge order and summary in place for d/c to SNF. No family/visitors present.  Pt able to nod and shake head during RD visit and indicates that he does not want RD to perform physical assessment at this time. Per chart review, current weight consistent with weight from November 2017. Pt likely meets criteria for some degree of malnutrition but unable to confirm based on ASPEN guidelines given information currently available.   Medications reviewed; 400 mg Mag-Ox/day, 2 g IV Mg sulfate x1 run  today, 17 g Miralax/day, 1 tablet Senokot BID. Labs reviewed; CBG: 75 mg/dL this AM, Ca: 7.8 mg/dL, Mg: 1.5 mg/dL.  IVF: NS @ 60 mL/hr.    Diet Order:  Diet regular Room service appropriate? Yes; Fluid consistency: Thin Diet general  Skin:  Reviewed, no issues  Last BM:  5/18  Height:   Ht Readings from Last 1 Encounters:  03/24/17 6' (1.829 m)    Weight:   Wt Readings from Last 1 Encounters:  03/28/17 125 lb (56.7 kg)    Ideal Body Weight:  80.91 kg  BMI:  Body mass index is 16.95 kg/m.  Estimated Nutritional Needs:   Kcal:  8676-1950 (25-29 kcal/kg)  Protein:  57-68 grams (1-1.2 grams/kg)  Fluid:  >/= 1.5 L/day  EDUCATION NEEDS:   No education needs identified at this time    Erik Matin, MS, RD, LDN, CNSC Inpatient Clinical Dietitian Pager # [phone] After hours/weekend pager # [phone]

## 2017-03-29 ENCOUNTER — Non-Acute Institutional Stay (SKILLED_NURSING_FACILITY): Payer: Medicare Other | Admitting: Internal Medicine

## 2017-03-29 ENCOUNTER — Encounter: Payer: Self-pay | Admitting: Internal Medicine

## 2017-03-29 DIAGNOSIS — E43 Unspecified severe protein-calorie malnutrition: Secondary | ICD-10-CM

## 2017-03-29 DIAGNOSIS — D696 Thrombocytopenia, unspecified: Secondary | ICD-10-CM | POA: Diagnosis not present

## 2017-03-29 DIAGNOSIS — F0280 Dementia in other diseases classified elsewhere without behavioral disturbance: Secondary | ICD-10-CM

## 2017-03-29 DIAGNOSIS — K529 Noninfective gastroenteritis and colitis, unspecified: Secondary | ICD-10-CM

## 2017-03-29 DIAGNOSIS — D638 Anemia in other chronic diseases classified elsewhere: Secondary | ICD-10-CM

## 2017-03-29 DIAGNOSIS — E039 Hypothyroidism, unspecified: Secondary | ICD-10-CM | POA: Diagnosis not present

## 2017-03-29 DIAGNOSIS — R338 Other retention of urine: Secondary | ICD-10-CM | POA: Diagnosis not present

## 2017-03-29 DIAGNOSIS — K5900 Constipation, unspecified: Secondary | ICD-10-CM

## 2017-03-29 DIAGNOSIS — R1902 Left upper quadrant abdominal swelling, mass and lump: Secondary | ICD-10-CM | POA: Diagnosis not present

## 2017-03-29 DIAGNOSIS — R531 Weakness: Secondary | ICD-10-CM | POA: Diagnosis not present

## 2017-03-29 DIAGNOSIS — R569 Unspecified convulsions: Secondary | ICD-10-CM

## 2017-03-29 DIAGNOSIS — G309 Alzheimer's disease, unspecified: Secondary | ICD-10-CM

## 2017-03-29 LAB — CARBAMAZEPINE, FREE AND TOTAL
Carbamazepine, Free: 1.2 ug/mL (ref 0.6–4.2)
Carbamazepine, Total: 4 ug/mL (ref 4.0–12.0)

## 2017-03-29 NOTE — Progress Notes (Signed)
LOCATION: [place]  PCP: Glendale Chard, MD   Code Status: DNR  Goals of care: Advanced Directive information Advanced Directives 03/29/2017  Does Patient Have a Medical Advance Directive? Yes  Type of Advance Directive Out of facility DNR (pink MOST or yellow form)  Does patient want to make changes to medical advance directive? No - Patient declined  Copy of [place] in Chart? -  Would patient like information on creating a medical advance directive? -       Extended Emergency Contact Information Primary Emergency Contact: Gilford Silvius Address: [address], [place] Phone: [phone] Work Phone: [phone] Relation: Spouse Secondary Emergency Contact: Gass,Dr. [place] Work Phone: [phone] Mobile Phone: [phone] Relation: Daughter   Allergies  Allergen Reactions  . Penicillins Swelling    Has patient had a PCN reaction causing immediate rash, facial/tongue/throat swelling, SOB or lightheadedness with hypotension: yes Has patient had a PCN reaction causing severe rash involving mucus membranes or skin necrosis: no Has patient had a PCN reaction that required hospitalization- yes already in the hospital Has patient had a PCN reaction occurring within the last 10 years: No If all of the above answers are "NO", then may proceed with Cephalosporin use.     Chief Complaint  Patient presents with  . New Admit To SNF    New Admission Visit      HPI:  Patient is a 83 y.o. male seen today for short term rehabilitation post hospital admission from 03/24/17-03/28/17 with acute renal failure from dehydration and acute urinary retention. He received iv fluids and required foley catheter. He failed voiding trial and required placement of foley catheter. UTI was ruled out. He was also started on iv antibiotic for proctocolitis. He has medical history of  dementia, seizure, COPD, hypothyroidism among others. He is seen in his room today.   Review of Systems: Limited HPI/ROS due to dementia. Obtained from patient and nursing Constitutional: Negative for fever   Respiratory: Negative for cough, shortness of breath Cardiovascular: Negative for chest pain  Gastrointestinal: Negative for vomiting Musculoskeletal: Negative for fall.  Skin: Negative for rash.  Psychiatric/Behavioral: Negative for anxiety   Past Medical History:  Diagnosis Date  . Alzheimer disease   . Seizures ([place])    Past Surgical History:  Procedure Laterality Date  . KNEE SURGERY     Social History:   reports that he has never smoked. He has never used smokeless tobacco. He reports that he does not drink alcohol. His drug history is not on file.  Family History  Problem Relation Age of Onset  . Family history unknown: Yes    Medications: Allergies as of 03/29/2017      Reactions   Penicillins Swelling   Has patient had a PCN reaction causing immediate rash, facial/tongue/throat swelling, SOB or lightheadedness with hypotension: yes Has patient had a PCN reaction causing severe rash involving mucus membranes or skin necrosis: no Has patient had a PCN reaction that required hospitalization- yes already in the hospital Has patient had a PCN reaction occurring within the last 10 years: No If all of the above answers are "NO", then may proceed with Cephalosporin use.      Medication List       Accurate as of 03/29/17 10:24 AM. Always use your most recent med list.  ARMOUR THYROID 90 MG tablet Generic drug:  thyroid Take 90 mg by mouth daily.   BETOPTIC-S 0.25 % ophthalmic suspension Generic drug:  betaxolol Place 1 drop into the right eye daily.   carbamazepine 400 MG 12 hr tablet Commonly known as:  TEGRETOL XR Take 1 tablet (400 mg total) by mouth 2 (two) times daily.   ciprofloxacin 500 MG tablet Commonly known as:  CIPRO Take 1 tablet  (500 mg total) by mouth 2 (two) times daily.   dorzolamide 2 % ophthalmic solution Commonly known as:  TRUSOPT Place 1 drop into the right eye every 12 (twelve) hours.   finasteride 5 MG tablet Commonly known as:  PROSCAR Take 1 tablet (5 mg total) by mouth daily.   latanoprost 0.005 % ophthalmic solution Commonly known as:  XALATAN Place 1 drop into the right eye at bedtime.   magnesium oxide 400 (241.3 Mg) MG tablet Commonly known as:  MAG-OX Take 1 tablet (400 mg total) by mouth daily.   metroNIDAZOLE 500 MG tablet Commonly known as:  FLAGYL Take 1 tablet (500 mg total) by mouth every 8 (eight) hours.   polyethylene glycol packet Commonly known as:  MIRALAX / GLYCOLAX Take 17 g by mouth daily.   senna-docusate 8.6-50 MG tablet Commonly known as:  Senokot-S Take 1 tablet by mouth 2 (two) times daily.   tamsulosin 0.4 MG Caps capsule Commonly known as:  FLOMAX Take 1 capsule (0.4 mg total) by mouth daily.       Immunizations:  There is no immunization history on file for this patient.   Physical Exam: Vitals:   03/29/17 1000  BP: 129/65  Pulse: 70  Resp: 18  Temp: 97 F (36.1 C)  TempSrc: Oral  SpO2: 96%  Weight: 125 lb (56.7 kg)  Height: 6' (1.829 m)   Body mass index is 16.95 kg/m.  General- elderly frail male, thin built, in no acute distress Head- normocephalic, atraumatic Nose- no maxillary or frontal sinus tenderness, no nasal discharge Throat- moist mucus membrane, normal oropharynx  Eyes- PERRLA, EOMI, no pallor, no icterus, no discharge, normal conjunctiva, normal sclera Neck- no cervical lymphadenopathy Cardiovascular- normal s1,s2, no murmur Respiratory- bilateral clear to auscultation, no wheeze, no rhonchi, no crackles, no use of accessory muscles Abdomen- bowel sounds present, soft, non tender, no guarding or rigidity, has foley catheter in place Musculoskeletal- able to move all 4 extremities, limited RUE movement with contracture to  right elbow and hand, generalized weakness, severe arthritis changes to his fingers Neurological- alert and oriented to self and place only Skin- warm and dry Psychiatry- normal mood and affect    Labs reviewed: Basic Metabolic Panel:  Recent Labs  03/25/17 0551 03/26/17 0546 03/27/17 0512 03/28/17 0513  NA 143 141  --  136  K 4.1 3.7  --  3.6  CL 111 110  --  106  CO2 23 24  --  24  GLUCOSE 79 76  --  81  BUN 60* 30*  --  20  CREATININE 2.57* 1.19 1.14 0.99  CALCIUM 9.5 8.4*  --  7.8*  MG  --   --   --  1.5*   Liver Function Tests:  Recent Labs  03/24/17 1815 03/26/17 0546  AST 46* 41  ALT 38 31  ALKPHOS 73 53  BILITOT 0.4 0.6  PROT 7.5 5.2*  ALBUMIN 4.1 2.9*   No results for input(s): LIPASE, AMYLASE in the last 8760 hours. No results for input(s): AMMONIA in the  last 8760 hours. CBC:  Recent Labs  03/25/17 0551 03/26/17 0546 03/28/17 0513  WBC 6.7 5.1 5.5  NEUTROABS 4.8 3.6 3.5  HGB 12.1* 10.9* 11.5*  HCT 34.3* 31.5* 32.5*  MCV 86.6 88.0 86.4  PLT 115* 122* 117*   Cardiac Enzymes: No results for input(s): CKTOTAL, CKMB, CKMBINDEX, TROPONINI in the last 8760 hours. BNP: Invalid input(s): POCBNP CBG:  Recent Labs  03/27/17 2348 03/28/17 0544 03/28/17 1130  GLUCAP 89 75 105*    Radiological Exams: Ct Abdomen Pelvis Wo Contrast  Result Date: 03/24/2017 CLINICAL DATA:  Lower abdominal pain. Last bowel movement was loose x2 days ago. EXAM: CT ABDOMEN AND PELVIS WITHOUT CONTRAST TECHNIQUE: Multidetector CT imaging of the abdomen and pelvis was performed following the standard protocol without IV contrast. COMPARISON:  MRI of the abdomen from 09/29/2010 FINDINGS: Lower chest: Normal sized cardiac chambers without pericardial effusion. Bullous emphysematous changes are seen in the left lower lobe. No pneumonic consolidation. No dominant mass. Hepatobiliary: Cholelithiasis without biliary dilatation. No space-occupying mass of the liver is identified  on this unenhanced study. Pancreas: Normal calibered pancreatic duct without focal pancreatic mass. Spleen: No splenomegaly. Adrenals/Urinary Tract: The adrenals and kidneys are normal in appearance. No obstructive uropathy. Thick walled nondistended bladder containing a Foley catheter is noted traversing an enlarged prostate gland measuring up to 6.3 cm in diameter. Small air-fluid level seen within the bladder likely from instrumentation. There appears be left-sided bladder diverticulum. Stomach/Bowel: Suboptimal assessment due to lack of oral and IV contrast. Soft tissue masslike abnormality in the left upper quadrant measuring approximately 4.6 cm in diameter may correspond with the lesion seen on prior MRI in 2011 measuring approximately 4.5 x 3.6 x 3.6 cm previously further characterization is limited. A moderate amount of stool is seen within large bowel. The rectosigmoid appears moderately thickened and inflamed consistent with a proctocolitis. Vascular/Lymphatic: Aortoiliac atherosclerosis without aneurysm. No pathologically enlarged lymph nodes. Reproductive: Enlarged prostate measuring up to 6.3 cm in diameter. Other: A moderate amount of ascites is seen within the abdomen and pelvis. Mild anasarca. Musculoskeletal: Chronic appearing mild T11 and T12 compression fractures with degenerative disc disease L2 through L4. Anterolisthesis of L5 on S1 grade. IMPRESSION: 1. Limited study given lack of IV and oral contrast. Left upper quadrant masslike abnormality is suspected as before currently estimated at 4.6 cm in diameter and slightly larger than prior. Epicenter may be associated with the stomach possibly representing a GIST tumor. 2. Thickened rectosigmoid suspicious for proctocolitis. 3. Mild anasarca with small to moderate volume of abdominopelvic ascites. 4. Enlarged prostate. 5. Thickened appearing bladder with left-sided bladder diverticulum may be secondary to a chronic cystitis. 6. Uncomplicated  cholelithiasis. Electronically Signed   By: Ashley Royalty M.D.   On: 03/24/2017 20:58   Dg Abd Acute W/chest  Result Date: 03/24/2017 CLINICAL DATA:  Lower abdominal pain. EXAM: DG ABDOMEN ACUTE W/ 1V CHEST COMPARISON:  None. FINDINGS: No pneumothorax. There is a tortuous thoracic aorta. The heart, hila, and mediastinum are otherwise unremarkable. Mild opacity is identified in the left base. A skin fold overlies left upper chest. No free air, portal venous gas, or pneumatosis. Degenerative changes are seen in the spine. No bowel obstruction identified. No free air on the right side up decubitus film. IMPRESSION: 1. Mild opacity in the left lung base could represent atelectasis or early infiltrate. Recommend clinical correlation and follow-up as clinically warranted. 2. No acute abnormalities seen within the abdomen. Electronically Signed   By: Dorise Bullion  III M.D   On: 03/24/2017 17:53    Assessment/Plan  Generalized weakness From deconditioning. Will have him work with physical therapy and occupational therapy team to help with gait training and muscle strengthening exercises.fall precautions. Skin care. Encourage to be out of bed.   Proctocolitis Continue and complete flagyl 500 mg tid and ciprofloxacin 500 mg bid on 04/01/17. Monitor for fever and diarrhea. Denies pain. Add florastor.   Acute urinary retention Enlarged prostate, chronic cystitis and proctocolitis could all be contributing to this. Has failed voiding trial in hospital. Make follow up with urology. Continue finasteride and tamsulosin. Continue foley care and maintain hydration. Off myrbetriq  Protein calorie malnutrition With his dementia, decline anticipated. RD consult and check weight.   Hypomagnesemia Check bmp and mg level, replete as needed. Continue mag oxide 400 mg daily for now  Thrombocytopenia No bleed reported, monitor platelet  Anemia of chronic disease Monitor cbc periodically  Dementia Monitor for  behavior change. Provide supportive care. SLP consult. Assistance with feeding  Constipation Continue senokot s bid and miralax daily  Hypothyroidism Continue his armour thyroid  Seizure disorder Remains seizure free. Continue carbamazepine 400 mg bid  Left upper quadrant mass Enlarged from before in imaging. 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

## 2017-03-31 LAB — HEPATIC FUNCTION PANEL
ALT: 23 U/L (ref 10–40)
AST: 24 U/L (ref 14–40)
Alkaline Phosphatase: 66 U/L (ref 25–125)
BILIRUBIN, TOTAL: 0.2 mg/dL

## 2017-03-31 LAB — CBC AND DIFFERENTIAL
HEMATOCRIT: 34 % — AB (ref 41–53)
Hemoglobin: 11.1 g/dL — AB (ref 13.5–17.5)
Neutrophils Absolute: 5 /uL
PLATELETS: 170 10*3/uL (ref 150–399)
WBC: 7.2 10*3/mL

## 2017-03-31 LAB — BASIC METABOLIC PANEL
BUN: 20 mg/dL (ref 4–21)
Creatinine: 0.9 mg/dL (ref 0.6–1.3)
Glucose: 76 mg/dL
Potassium: 4.1 mmol/L (ref 3.4–5.3)
Sodium: 138 mmol/L (ref 137–147)

## 2017-04-12 ENCOUNTER — Inpatient Hospital Stay (HOSPITAL_COMMUNITY)
Admission: EM | Admit: 2017-04-12 | Discharge: 2017-05-06 | DRG: 698 | Disposition: A | Discharge status: Hospice | Payer: Medicare Other | Attending: Internal Medicine | Admitting: Internal Medicine

## 2017-04-12 ENCOUNTER — Encounter (HOSPITAL_COMMUNITY): Payer: Self-pay | Admitting: Emergency Medicine

## 2017-04-12 ENCOUNTER — Emergency Department (HOSPITAL_COMMUNITY): Payer: Medicare Other

## 2017-04-12 DIAGNOSIS — D638 Anemia in other chronic diseases classified elsewhere: Secondary | ICD-10-CM | POA: Diagnosis present

## 2017-04-12 DIAGNOSIS — Z681 Body mass index (BMI) 19 or less, adult: Secondary | ICD-10-CM | POA: Diagnosis not present

## 2017-04-12 DIAGNOSIS — C49A0 Gastrointestinal stromal tumor, unspecified site: Secondary | ICD-10-CM | POA: Diagnosis present

## 2017-04-12 DIAGNOSIS — R197 Diarrhea, unspecified: Secondary | ICD-10-CM | POA: Diagnosis present

## 2017-04-12 DIAGNOSIS — Z978 Presence of other specified devices: Secondary | ICD-10-CM

## 2017-04-12 DIAGNOSIS — R569 Unspecified convulsions: Secondary | ICD-10-CM

## 2017-04-12 DIAGNOSIS — G40909 Epilepsy, unspecified, not intractable, without status epilepticus: Secondary | ICD-10-CM | POA: Diagnosis present

## 2017-04-12 DIAGNOSIS — M24522 Contracture, left elbow: Secondary | ICD-10-CM | POA: Diagnosis present

## 2017-04-12 DIAGNOSIS — R918 Other nonspecific abnormal finding of lung field: Secondary | ICD-10-CM | POA: Diagnosis not present

## 2017-04-12 DIAGNOSIS — T83511A Infection and inflammatory reaction due to indwelling urethral catheter, initial encounter: Principal | ICD-10-CM | POA: Diagnosis present

## 2017-04-12 DIAGNOSIS — B952 Enterococcus as the cause of diseases classified elsewhere: Secondary | ICD-10-CM | POA: Diagnosis present

## 2017-04-12 DIAGNOSIS — Z4682 Encounter for fitting and adjustment of non-vascular catheter: Secondary | ICD-10-CM | POA: Diagnosis not present

## 2017-04-12 DIAGNOSIS — Z515 Encounter for palliative care: Secondary | ICD-10-CM | POA: Diagnosis not present

## 2017-04-12 DIAGNOSIS — J90 Pleural effusion, not elsewhere classified: Secondary | ICD-10-CM | POA: Diagnosis not present

## 2017-04-12 DIAGNOSIS — E43 Unspecified severe protein-calorie malnutrition: Secondary | ICD-10-CM | POA: Diagnosis present

## 2017-04-12 DIAGNOSIS — R6521 Severe sepsis with septic shock: Secondary | ICD-10-CM | POA: Diagnosis present

## 2017-04-12 DIAGNOSIS — F0280 Dementia in other diseases classified elsewhere without behavioral disturbance: Secondary | ICD-10-CM | POA: Diagnosis present

## 2017-04-12 DIAGNOSIS — N401 Enlarged prostate with lower urinary tract symptoms: Secondary | ICD-10-CM | POA: Diagnosis present

## 2017-04-12 DIAGNOSIS — R6520 Severe sepsis without septic shock: Secondary | ICD-10-CM | POA: Diagnosis not present

## 2017-04-12 DIAGNOSIS — E039 Hypothyroidism, unspecified: Secondary | ICD-10-CM | POA: Diagnosis present

## 2017-04-12 DIAGNOSIS — F0390 Unspecified dementia without behavioral disturbance: Secondary | ICD-10-CM | POA: Diagnosis present

## 2017-04-12 DIAGNOSIS — R1310 Dysphagia, unspecified: Secondary | ICD-10-CM | POA: Diagnosis present

## 2017-04-12 DIAGNOSIS — E861 Hypovolemia: Secondary | ICD-10-CM | POA: Diagnosis present

## 2017-04-12 DIAGNOSIS — D62 Acute posthemorrhagic anemia: Secondary | ICD-10-CM | POA: Diagnosis present

## 2017-04-12 DIAGNOSIS — G9341 Metabolic encephalopathy: Secondary | ICD-10-CM | POA: Diagnosis present

## 2017-04-12 DIAGNOSIS — E876 Hypokalemia: Secondary | ICD-10-CM | POA: Diagnosis not present

## 2017-04-12 DIAGNOSIS — Z880 Allergy status to penicillin: Secondary | ICD-10-CM

## 2017-04-12 DIAGNOSIS — M436 Torticollis: Secondary | ICD-10-CM | POA: Diagnosis present

## 2017-04-12 DIAGNOSIS — Y95 Nosocomial condition: Secondary | ICD-10-CM | POA: Diagnosis present

## 2017-04-12 DIAGNOSIS — R7881 Bacteremia: Secondary | ICD-10-CM | POA: Diagnosis not present

## 2017-04-12 DIAGNOSIS — I959 Hypotension, unspecified: Secondary | ICD-10-CM | POA: Diagnosis not present

## 2017-04-12 DIAGNOSIS — J690 Pneumonitis due to inhalation of food and vomit: Secondary | ICD-10-CM | POA: Diagnosis present

## 2017-04-12 DIAGNOSIS — E872 Acidosis: Secondary | ICD-10-CM | POA: Diagnosis present

## 2017-04-12 DIAGNOSIS — Z66 Do not resuscitate: Secondary | ICD-10-CM | POA: Diagnosis present

## 2017-04-12 DIAGNOSIS — Z87440 Personal history of urinary (tract) infections: Secondary | ICD-10-CM

## 2017-04-12 DIAGNOSIS — T83518A Infection and inflammatory reaction due to other urinary catheter, initial encounter: Secondary | ICD-10-CM | POA: Diagnosis not present

## 2017-04-12 DIAGNOSIS — Z7189 Other specified counseling: Secondary | ICD-10-CM | POA: Diagnosis not present

## 2017-04-12 DIAGNOSIS — N390 Urinary tract infection, site not specified: Secondary | ICD-10-CM | POA: Diagnosis present

## 2017-04-12 DIAGNOSIS — J9601 Acute respiratory failure with hypoxia: Secondary | ICD-10-CM | POA: Diagnosis not present

## 2017-04-12 DIAGNOSIS — Z9911 Dependence on respirator [ventilator] status: Secondary | ICD-10-CM | POA: Diagnosis not present

## 2017-04-12 DIAGNOSIS — G9340 Encephalopathy, unspecified: Secondary | ICD-10-CM

## 2017-04-12 DIAGNOSIS — R100 Acute abdomen: Secondary | ICD-10-CM | POA: Diagnosis not present

## 2017-04-12 DIAGNOSIS — M24532 Contracture, left wrist: Secondary | ICD-10-CM | POA: Diagnosis present

## 2017-04-12 DIAGNOSIS — R404 Transient alteration of awareness: Secondary | ICD-10-CM | POA: Diagnosis not present

## 2017-04-12 DIAGNOSIS — E162 Hypoglycemia, unspecified: Secondary | ICD-10-CM | POA: Diagnosis present

## 2017-04-12 DIAGNOSIS — M24521 Contracture, right elbow: Secondary | ICD-10-CM | POA: Diagnosis present

## 2017-04-12 DIAGNOSIS — Y732 Prosthetic and other implants, materials and accessory gastroenterology and urology devices associated with adverse incidents: Secondary | ICD-10-CM | POA: Diagnosis not present

## 2017-04-12 DIAGNOSIS — J189 Pneumonia, unspecified organism: Secondary | ICD-10-CM | POA: Diagnosis not present

## 2017-04-12 DIAGNOSIS — D696 Thrombocytopenia, unspecified: Secondary | ICD-10-CM | POA: Diagnosis present

## 2017-04-12 DIAGNOSIS — G309 Alzheimer's disease, unspecified: Secondary | ICD-10-CM | POA: Diagnosis present

## 2017-04-12 DIAGNOSIS — J9690 Respiratory failure, unspecified, unspecified whether with hypoxia or hypercapnia: Secondary | ICD-10-CM | POA: Diagnosis not present

## 2017-04-12 DIAGNOSIS — D649 Anemia, unspecified: Secondary | ICD-10-CM | POA: Diagnosis present

## 2017-04-12 DIAGNOSIS — Z0189 Encounter for other specified special examinations: Secondary | ICD-10-CM

## 2017-04-12 DIAGNOSIS — E440 Moderate protein-calorie malnutrition: Secondary | ICD-10-CM

## 2017-04-12 DIAGNOSIS — Y846 Urinary catheterization as the cause of abnormal reaction of the patient, or of later complication, without mention of misadventure at the time of the procedure: Secondary | ICD-10-CM | POA: Diagnosis present

## 2017-04-12 DIAGNOSIS — Z4659 Encounter for fitting and adjustment of other gastrointestinal appliance and device: Secondary | ICD-10-CM

## 2017-04-12 DIAGNOSIS — A419 Sepsis, unspecified organism: Secondary | ICD-10-CM | POA: Diagnosis present

## 2017-04-12 DIAGNOSIS — Z79899 Other long term (current) drug therapy: Secondary | ICD-10-CM

## 2017-04-12 DIAGNOSIS — R031 Nonspecific low blood-pressure reading: Secondary | ICD-10-CM | POA: Diagnosis not present

## 2017-04-12 DIAGNOSIS — Z8619 Personal history of other infectious and parasitic diseases: Secondary | ICD-10-CM | POA: Diagnosis not present

## 2017-04-12 DIAGNOSIS — Z221 Carrier of other intestinal infectious diseases: Secondary | ICD-10-CM

## 2017-04-12 DIAGNOSIS — Z0389 Encounter for observation for other suspected diseases and conditions ruled out: Secondary | ICD-10-CM | POA: Diagnosis not present

## 2017-04-12 DIAGNOSIS — L723 Sebaceous cyst: Secondary | ICD-10-CM

## 2017-04-12 DIAGNOSIS — R319 Hematuria, unspecified: Secondary | ICD-10-CM | POA: Diagnosis not present

## 2017-04-12 DIAGNOSIS — Z436 Encounter for attention to other artificial openings of urinary tract: Secondary | ICD-10-CM | POA: Diagnosis not present

## 2017-04-12 DIAGNOSIS — N179 Acute kidney failure, unspecified: Secondary | ICD-10-CM | POA: Diagnosis not present

## 2017-04-12 DIAGNOSIS — J9600 Acute respiratory failure, unspecified whether with hypoxia or hypercapnia: Secondary | ICD-10-CM

## 2017-04-12 DIAGNOSIS — M24531 Contracture, right wrist: Secondary | ICD-10-CM | POA: Diagnosis present

## 2017-04-12 DIAGNOSIS — R9431 Abnormal electrocardiogram [ECG] [EKG]: Secondary | ICD-10-CM | POA: Diagnosis not present

## 2017-04-12 DIAGNOSIS — R338 Other retention of urine: Secondary | ICD-10-CM | POA: Diagnosis present

## 2017-04-12 DIAGNOSIS — D6489 Other specified anemias: Secondary | ICD-10-CM | POA: Diagnosis present

## 2017-04-12 DIAGNOSIS — B9689 Other specified bacterial agents as the cause of diseases classified elsewhere: Secondary | ICD-10-CM | POA: Diagnosis present

## 2017-04-12 LAB — COMPREHENSIVE METABOLIC PANEL
ALBUMIN: 2.8 g/dL — AB (ref 3.5–5.0)
ALK PHOS: 103 U/L (ref 38–126)
ALT: 57 U/L (ref 17–63)
ANION GAP: 12 (ref 5–15)
AST: 90 U/L — ABNORMAL HIGH (ref 15–41)
BILIRUBIN TOTAL: 0.6 mg/dL (ref 0.3–1.2)
BUN: 32 mg/dL — ABNORMAL HIGH (ref 6–20)
CO2: 20 mmol/L — AB (ref 22–32)
CREATININE: 1.68 mg/dL — AB (ref 0.61–1.24)
Calcium: 8 mg/dL — ABNORMAL LOW (ref 8.9–10.3)
Chloride: 112 mmol/L — ABNORMAL HIGH (ref 101–111)
GFR calc non Af Amer: 35 mL/min — ABNORMAL LOW (ref >60)
GFR, EST AFRICAN AMERICAN: 41 mL/min — AB (ref >60)
GLUCOSE: 79 mg/dL (ref 65–99)
Potassium: 3.7 mmol/L (ref 3.5–5.1)
SODIUM: 144 mmol/L (ref 135–145)
TOTAL PROTEIN: 5.5 g/dL — AB (ref 6.5–8.1)

## 2017-04-12 LAB — CBC WITH DIFFERENTIAL/PLATELET
BASOS ABS: 0 10*3/uL (ref 0.0–0.1)
BASOS PCT: 0 %
EOS ABS: 0 10*3/uL (ref 0.0–0.7)
Eosinophils Relative: 0 %
HCT: 30.6 % — ABNORMAL LOW (ref 39.0–52.0)
Hemoglobin: 10.2 g/dL — ABNORMAL LOW (ref 13.0–17.0)
Lymphocytes Relative: 2 %
Lymphs Abs: 0.4 10*3/uL — ABNORMAL LOW (ref 0.7–4.0)
MCH: 30.7 pg (ref 26.0–34.0)
MCHC: 33.3 g/dL (ref 30.0–36.0)
MCV: 92.2 fL (ref 78.0–100.0)
MONO ABS: 0.8 10*3/uL (ref 0.1–1.0)
Monocytes Relative: 4 %
NEUTROS ABS: 18.4 10*3/uL — AB (ref 1.7–7.7)
Neutrophils Relative %: 94 %
PLATELETS: 127 10*3/uL — AB (ref 150–400)
RBC: 3.32 MIL/uL — ABNORMAL LOW (ref 4.22–5.81)
RDW: 15.2 % (ref 11.5–15.5)
WBC Morphology: INCREASED
WBC: 19.6 10*3/uL — ABNORMAL HIGH (ref 4.0–10.5)

## 2017-04-12 LAB — I-STAT CG4 LACTIC ACID, ED
LACTIC ACID, VENOUS: 5.15 mmol/L — AB (ref 0.5–1.9)
Lactic Acid, Venous: 6.17 mmol/L (ref 0.5–1.9)

## 2017-04-12 LAB — C DIFFICILE QUICK SCREEN W PCR REFLEX
C DIFFICILE (CDIFF) TOXIN: NEGATIVE
C DIFFICLE (CDIFF) ANTIGEN: POSITIVE — AB

## 2017-04-12 MED ORDER — DEXTROSE 5 % IV SOLN
1.0000 g | INTRAVENOUS | Status: DC
  Filled 2017-04-12: qty 1

## 2017-04-12 MED ORDER — LEVOFLOXACIN IN D5W 750 MG/150ML IV SOLN: 750.0000 mg | Freq: Once | INTRAVENOUS | Status: DC

## 2017-04-12 MED ORDER — SODIUM CHLORIDE 0.9 % IV BOLUS (SEPSIS)
1000.0000 mL | Freq: Once | INTRAVENOUS | Status: DC
  Administered 2017-04-12: 1000 mL via INTRAVENOUS

## 2017-04-12 MED ORDER — SODIUM CHLORIDE 0.9 % IV BOLUS (SEPSIS)
500.0000 mL | Freq: Once | INTRAVENOUS | Status: AC
  Administered 2017-04-12: 500 mL via INTRAVENOUS

## 2017-04-12 MED ORDER — ONDANSETRON HCL 4 MG PO TABS
4.0000 mg | ORAL_TABLET | Freq: Four times a day (QID) | ORAL | Status: DC | PRN
Start: 2017-04-12 — End: 2017-04-28

## 2017-04-12 MED ORDER — CEFEPIME HCL 2 G IJ SOLR
2.0000 g | Freq: Once | INTRAMUSCULAR | Status: AC
  Administered 2017-04-12: 2 g via INTRAVENOUS
  Filled 2017-04-12: qty 2

## 2017-04-12 MED ORDER — CARBAMAZEPINE ER 200 MG PO TB12
400.0000 mg | ORAL_TABLET | Freq: Two times a day (BID) | ORAL | Status: DC
  Administered 2017-04-13 (×3): 400 mg via ORAL
  Filled 2017-04-12 (×4): qty 2

## 2017-04-12 MED ORDER — ACETAMINOPHEN 650 MG RE SUPP: 650.0000 mg | Freq: Four times a day (QID) | RECTAL | Status: DC | PRN

## 2017-04-12 MED ORDER — SODIUM CHLORIDE 0.9 % IV SOLN
1000.0000 mL | INTRAVENOUS | Status: DC
  Administered 2017-04-12: 1000 mL via INTRAVENOUS

## 2017-04-12 MED ORDER — FENTANYL CITRATE (PF) 100 MCG/2ML IJ SOLN: 25.0000 ug | Freq: Once | INTRAMUSCULAR | Status: DC

## 2017-04-12 MED ORDER — SODIUM CHLORIDE 0.9 % IV SOLN: 500.0000 mg | INTRAVENOUS | Status: DC

## 2017-04-12 MED ORDER — LEVOFLOXACIN IN D5W 750 MG/150ML IV SOLN: 750.0000 mg | INTRAVENOUS | Status: DC

## 2017-04-12 MED ORDER — LATANOPROST 0.005 % OP SOLN
1.0000 [drp] | Freq: Every day | OPHTHALMIC | Status: DC
  Administered 2017-04-13 – 2017-05-05 (×24): 1 [drp] via OPHTHALMIC
  Filled 2017-04-12 (×2): qty 2.5

## 2017-04-12 MED ORDER — DEXTROSE 5 % IV SOLN: 2.0000 g | Freq: Once | INTRAVENOUS | Status: DC

## 2017-04-12 MED ORDER — SODIUM CHLORIDE 0.9 % IV BOLUS (SEPSIS)
1000.0000 mL | Freq: Once | INTRAVENOUS | Status: AC
  Administered 2017-04-12: 1000 mL via INTRAVENOUS

## 2017-04-12 MED ORDER — THYROID 60 MG PO TABS
90.0000 mg | ORAL_TABLET | Freq: Every day | ORAL | Status: DC
  Administered 2017-04-13 – 2017-04-16 (×2): 90 mg via ORAL
  Filled 2017-04-12 (×5): qty 1

## 2017-04-12 MED ORDER — FENTANYL CITRATE (PF) 100 MCG/2ML IJ SOLN: 50.0000 ug | Freq: Once | INTRAMUSCULAR | Status: DC

## 2017-04-12 MED ORDER — MAGNESIUM OXIDE 400 (241.3 MG) MG PO TABS
400.0000 mg | ORAL_TABLET | Freq: Every day | ORAL | Status: DC
Start: 2017-04-13 — End: 2017-04-17
  Administered 2017-04-13 – 2017-04-16 (×2): 400 mg via ORAL
  Filled 2017-04-12 (×2): qty 1

## 2017-04-12 MED ORDER — DORZOLAMIDE HCL 2 % OP SOLN
1.0000 [drp] | Freq: Two times a day (BID) | OPHTHALMIC | Status: DC
  Administered 2017-04-13 – 2017-05-06 (×48): 1 [drp] via OPHTHALMIC
  Filled 2017-04-12: qty 10

## 2017-04-12 MED ORDER — ONDANSETRON HCL 4 MG/2ML IJ SOLN: 4.0000 mg | Freq: Four times a day (QID) | INTRAMUSCULAR | Status: DC | PRN

## 2017-04-12 MED ORDER — VANCOMYCIN HCL IN DEXTROSE 1-5 GM/200ML-% IV SOLN
1000.0000 mg | Freq: Once | INTRAVENOUS | Status: AC
  Administered 2017-04-12: 1000 mg via INTRAVENOUS
  Filled 2017-04-12: qty 200

## 2017-04-12 MED ORDER — ACETAMINOPHEN 325 MG PO TABS: 650.0000 mg | ORAL_TABLET | Freq: Four times a day (QID) | ORAL | Status: DC | PRN

## 2017-04-12 MED ORDER — BETAXOLOL HCL 0.25 % OP SUSP
1.0000 [drp] | Freq: Every day | OPHTHALMIC | Status: DC
  Administered 2017-04-13 – 2017-05-06 (×24): 1 [drp] via OPHTHALMIC
  Filled 2017-04-12 (×2): qty 10

## 2017-04-12 MED ORDER — SODIUM CHLORIDE 0.9 % IV SOLN: INTRAVENOUS | Status: DC

## 2017-04-12 NOTE — H&P (Signed)
History and Physical    MAXIMUM Erik Massey:[id number] DOB: [birth year] DOA: 04/12/2017  PCP: Glendale Chard, MD  Patient coming from: Skilled nursing facility.  Chief Complaint: Increasing confusion, hypotension and hematuria.  HPI: Erik Massey is a 83 y.o. 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 As per the patient's daughter who provided the history patient was doing fine until 2 days ago when patient became increasingly lethargic weak and more bed bound. Patient was brought to the occupational getting hypotensive and lethargic and increasing hematuria.   ED Course: In the ER patient was found to be hypotensive with Foley catheter draining hematuria. Patient also had a large loose bowel movement in the ER. Chest x-ray shows possible pneumonic process. Blood cultures urine cultures were obtained and patient was started empirically antibiotics for sepsis and fluid started.  Review of Systems: As per HPI, rest all negative.   Past Medical History:  Diagnosis Date  . Alzheimer disease   . Seizures ([place])     Past Surgical History:  Procedure Laterality Date  . KNEE SURGERY       reports that he has never smoked. He has never used smokeless tobacco. He reports that he does not drink alcohol. His drug history is not on file.  Allergies  Allergen Reactions  . Penicillins Swelling    Has patient had a PCN reaction causing immediate rash, facial/tongue/throat swelling, SOB or lightheadedness with hypotension: yes Has patient had a PCN reaction causing severe rash involving mucus membranes or skin necrosis:  no Has patient had a PCN reaction that required hospitalization- yes already in the hospital Has patient had a PCN reaction occurring within the last 10 years: No If all of the above answers are "NO", then may proceed with Cephalosporin use.     Family History  Problem Relation Age of Onset  . Family history unknown: Yes    Prior to Admission medications   Medication Sig Start Date End Date Taking? Authorizing Provider  ARMOUR THYROID 90 MG tablet Take 90 mg by mouth daily. 03/04/17  Yes [provider]  BETOPTIC-S 0.25 % ophthalmic suspension Place 1 drop into the right eye daily.  05/17/14  Yes [provider]  carbamazepine (TEGRETOL XR) 400 MG 12 hr tablet Take 1 tablet (400 mg total) by mouth 2 (two) times daily. 10/20/15  Yes Pollina, Gwenyth Allegra, MD  ciprofloxacin (CIPRO) 500 MG tablet Take 500 mg by mouth 2 (two) times daily. 03/28/17  Yes [provider]  dorzolamide (TRUSOPT) 2 % ophthalmic solution Place 1 drop into the right eye every 12 (twelve) hours.  05/16/14  Yes [provider]  finasteride (PROSCAR) 5 MG tablet Take 1 tablet (5 mg total) by mouth daily. 03/29/17  Yes Florencia Reasons, MD  latanoprost (XALATAN) 0.005 % ophthalmic solution Place 1 drop into the right eye at bedtime.  04/19/14  Yes [provider]  magnesium oxide (MAG-OX) 400 (241.3 Mg) MG tablet Take 1 tablet (400 mg total) by mouth daily. 03/29/17  Yes Florencia Reasons, MD  metroNIDAZOLE (FLAGYL) 500 MG tablet Take 500 mg by mouth every 8 (eight) hours. 03/28/17  Yes [provider]  polyethylene glycol (MIRALAX / GLYCOLAX) packet Take 17 g by mouth daily. 03/27/17  Yes Florencia Reasons, MD  senna-docusate (SENOKOT-S) 8.6-50 MG tablet Take 1 tablet by mouth 2 (two) times daily. 03/27/17  Yes Florencia Reasons, MD  tamsulosin (FLOMAX) 0.4 MG CAPS capsule Take 1 capsule (0.4 mg total) by mouth daily. 03/27/17  Yes Florencia Reasons, MD    Physical Exam: Vitals:   04/12/17 1845 04/12/17 1900 04/12/17  1930 04/12/17 2251  BP:  (!) 92/59 95/66 (!) 106/48  Pulse: 94 88 92   Resp: (!) 22 (!) 22 (!) 21 (!) 29  Temp:      TempSrc:      SpO2: 100% 99% 97% 99%      Constitutional: Moderately built and nourished. Vitals:   04/12/17 1845 04/12/17 1900 04/12/17 1930 04/12/17 2251  BP:  (!) 92/59 95/66 (!) 106/48  Pulse: 94 88 92   Resp: (!) 22 (!) 22 (!) 21 (!) 29  Temp:      TempSrc:      SpO2: 100% 99% 97% 99%   Eyes: Anicteric no pallor. ENMT: No discharge from the ears eyes nose and mouth. Neck: No mass felt. No neck rigidity. Respiratory: No rhonchi or crepitations. Cardiovascular: S1 and S2 heard no murmurs appreciated. Abdomen: Soft nontender bowel sounds present. Musculoskeletal: No edema. No joint effusion. Skin: No rash. Skin appears warm. Neurologic: Patient is alert awake oriented to his name. Appears irritable. Does not follow commands at this time. Psychiatric: Alert awake oriented to his name.   Labs on Admission: I have personally reviewed following labs and imaging studies  CBC:  Recent Labs Lab 04/12/17 1854  WBC 19.6*  NEUTROABS 18.4*  HGB 10.2*  HCT 30.6*  MCV 92.2  PLT 245*   Basic Metabolic Panel:  Recent Labs Lab 04/12/17 1854  NA 144  K 3.7  CL 112*  CO2 20*  GLUCOSE 79  BUN 32*  CREATININE 1.68*  CALCIUM 8.0*   GFR: CrCl cannot be calculated (Unknown ideal weight.). Liver Function Tests:  Recent Labs Lab 04/12/17 1854  AST 90*  ALT 57  ALKPHOS 103  BILITOT 0.6  PROT 5.5*  ALBUMIN 2.8*   No results for input(s): LIPASE, AMYLASE in the last 168 hours. No results for input(s): AMMONIA in the last 168 hours. Coagulation Profile: No results for input(s): INR, PROTIME in the last 168 hours. Cardiac Enzymes: No results for input(s): CKTOTAL, CKMB, CKMBINDEX, TROPONINI in the last 168 hours. BNP (last 3 results) No results for input(s): PROBNP in the last 8760 hours. HbA1C: No results for input(s): HGBA1C in the last 72  hours. CBG: No results for input(s): GLUCAP in the last 168 hours. Lipid Profile: No results for input(s): CHOL, HDL, LDLCALC, TRIG, CHOLHDL, LDLDIRECT in the last 72 hours. Thyroid Function Tests: No results for input(s): TSH, T4TOTAL, FREET4, T3FREE, THYROIDAB in the last 72 hours. Anemia Panel: No results for input(s): VITAMINB12, FOLATE, FERRITIN, TIBC, IRON, RETICCTPCT in the last 72 hours. Urine analysis:    Component Value Date/Time   COLORURINE YELLOW 03/24/2017 2022   APPEARANCEUR CLEAR 03/24/2017 2022   LABSPEC 1.023 03/24/2017 2022   PHURINE 5.0 03/24/2017 2022   GLUCOSEU NEGATIVE 03/24/2017 2022   HGBUR SMALL (A) 03/24/2017 2022   BILIRUBINUR NEGATIVE 03/24/2017 2022   KETONESUR NEGATIVE 03/24/2017 2022   PROTEINUR NEGATIVE 03/24/2017 2022   NITRITE NEGATIVE 03/24/2017 2022   LEUKOCYTESUR NEGATIVE 03/24/2017 2022   Sepsis Labs: @LABRCNTIP (procalcitonin:4,lacticidven:4) ) Recent Results (from the past 240  hour(s))  C difficile quick scan w PCR reflex     Status: Abnormal   Collection Time: 04/12/17  5:33 PM  Result Value Ref Range Status   C Diff antigen POSITIVE (A) NEGATIVE Final   C Diff toxin NEGATIVE NEGATIVE Final   C Diff interpretation Results are indeterminate. See PCR results.  Final     Radiological Exams on Admission: Dg Chest Port 1 View  Result Date: 04/12/2017 CLINICAL DATA:  Hypotension.  Dementia. EXAM: PORTABLE CHEST 1 VIEW COMPARISON:  03/24/2017 and 02/08/2008 FINDINGS: Patient is slightly rotated to the left. Lungs are adequately inflated and demonstrate mild opacification over the left base without significant change which may be due to atelectasis or infection. No evidence of effusion. Cardiomediastinal silhouette and remainder of the exam is unchanged. IMPRESSION: Stable mild opacification over the left base which may be due to atelectasis or infection. Electronically Signed   By: Marin Olp M.D.   On: 04/12/2017 18:00       Assessment/Plan Principal Problem:   Sepsis ([place]) Active Problems:   Seizure ([place])   ARF (acute renal failure) ([facility])   Normochromic normocytic anemia   Diarrhea    1. Septic shock - psoas could be either pneumonia or intra-abdominal like colitis are UTI. Patient has received 5 L of fluid despite which patient is still hypotensive. Patient is a DO NOT RESUSCITATE and so I have discussed with patient's daughter who had this time states okay to start vasopressors but no CPR or intubation. I have consulted critical care. Patient will be started on Neo-Synephrine for septic shock. 2. Acute blood loss anemia - follow CBC. Patient has hematuria. 3. History of seizures and Tegretol. Tegretol levels are pending. 4. Acute encephalopathy likely from sepsis. Closely observe. 5. Diarrhea - check stool studies. 6. Hypothyroidism on replacement Armour thyroid. 7. 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 Family is opted conservative measures.   DVT prophylaxis: SCDs. Code Status: DO NOT RESUSCITATE but okay with VASSOPRESSORS.  Family Communication: Patient's daughter.  Disposition Plan: To be determined.  Consults called: Pulmonary critical care.  Admission status: Inpatient.    Rise Patience MD [facility] Pager [phone].  If 7PM-7AM, please contact night-coverage [email or web address] Password [email or web address]  04/12/2017, 11:12 PM

## 2017-04-12 NOTE — ED Notes (Addendum)
Unable to draw bld cultures on pt dew to limited iv access. Will have someone attempt an ultrasound IV asap for cultures and additional iv access. 1st antibiotic administered, unable to wait on blood cultures.

## 2017-04-12 NOTE — ED Triage Notes (Signed)
Per [facility] pt from assisted living with baseline dementia was brought for hypotension. Facility reports finding patient like this and unable to give any other information. Pt is alert however not talking. Hx of dementia.

## 2017-04-12 NOTE — ED Provider Notes (Signed)
[facility] DEPT Provider Note   CSN: [id number] Arrival date & time: 04/12/17  1643     History   Chief Complaint Chief Complaint  Patient presents with  . Hypotension    HPI Erik Massey is a 83 y.o. male.  Patient with a history of dementia is a DO NOT RESUSCITATE. Sent in from nursing facility for low blood pressure. Patient alert upon arrival. Initial blood pressure here was 96 systolic. Patient without any specific complaints.      Past Medical History:  Diagnosis Date  . Alzheimer disease   . Seizures [facility])     Patient Active Problem List   Diagnosis Date Noted  . ARF (acute renal failure) ([place]) 03/24/2017  . Abdominal pain 03/24/2017  . Seizure ([place]) 10/20/2015  . UTI (lower urinary tract infection) 10/20/2015  . Falls 10/20/2015  . Cough 10/20/2015  . Physical deconditioning   . BPH (benign prostatic hyperplasia)     Past Surgical History:  Procedure Laterality Date  . KNEE SURGERY         Home Medications    Prior to Admission medications   Medication Sig Start Date End Date Taking? Authorizing Provider  ARMOUR THYROID 90 MG tablet Take 90 mg by mouth daily. 03/04/17  Yes [provider]  BETOPTIC-S 0.25 % ophthalmic suspension Place 1 drop into the right eye daily.  05/17/14  Yes [provider]  carbamazepine (TEGRETOL XR) 400 MG 12 hr tablet Take 1 tablet (400 mg total) by mouth 2 (two) times daily. 10/20/15  Yes Pollina, Gwenyth Allegra, MD  ciprofloxacin (CIPRO) 500 MG tablet Take 500 mg by mouth 2 (two) times daily. 03/28/17  Yes [provider]  dorzolamide (TRUSOPT) 2 % ophthalmic solution Place 1 drop into the right eye every 12 (twelve) hours.  05/16/14  Yes [provider]  finasteride (PROSCAR) 5 MG tablet Take 1 tablet (5 mg total) by mouth daily. 03/29/17  Yes Florencia Reasons, MD  latanoprost (XALATAN) 0.005 % ophthalmic solution Place 1 drop into the right eye at bedtime.  04/19/14  Yes [provider]  magnesium oxide (MAG-OX) 400 (241.3 Mg) MG tablet Take 1 tablet (400 mg total) by mouth daily. 03/29/17  Yes Florencia Reasons, MD  metroNIDAZOLE (FLAGYL) 500 MG tablet Take 500 mg by mouth every 8 (eight) hours. 03/28/17  Yes [provider]  polyethylene glycol (MIRALAX / GLYCOLAX) packet Take 17 g by mouth daily. 03/27/17  Yes Florencia Reasons, MD  senna-docusate (SENOKOT-S) 8.6-50 MG tablet Take 1 tablet by mouth 2 (two) times daily. 03/27/17  Yes Florencia Reasons, MD  tamsulosin (FLOMAX) 0.4 MG CAPS capsule Take 1 capsule (0.4 mg total) by mouth daily. 03/27/17  Yes Florencia Reasons, MD    Family History Family History  Problem Relation Age of Onset  . Family history unknown: Yes    Social History Social History  Substance Use Topics  . Smoking status: Never Smoker  . Smokeless tobacco: Never Used  . Alcohol use No     Allergies   Penicillins   Review of Systems Review of Systems  Unable to perform ROS: Dementia     Physical Exam Updated Vital Signs BP (!) 92/59   Pulse 88   Temp 100.1 F (37.8 C) (Rectal)   Resp (!) 22   SpO2 99%   Physical Exam  Constitutional: He appears well-developed and well-nourished.  HENT:  Head: Normocephalic and atraumatic.  Mucous membranes dry.  Eyes: EOM are normal.  Neck: Neck  supple.  Cardiovascular: Normal rate, regular rhythm and normal heart sounds.   Pulmonary/Chest: Effort normal and breath sounds normal.  Abdominal: Soft. Bowel sounds are normal. There is no tenderness.  Genitourinary:  Genitourinary Comments: Foley catheter in place. Urine dark.  Musculoskeletal: Normal range of motion.  Neurological: He is alert.  Moves all 4 extremities and will follow most commands.  Skin: Skin is warm.  Nursing note and vitals reviewed.    ED Treatments / Results  Labs (all labs ordered are listed, but only abnormal results are displayed) Labs Reviewed  C DIFFICILE QUICK SCREEN W PCR REFLEX - Abnormal; Notable for the following:         Result Value   C Diff antigen POSITIVE (*)    All other components within normal limits  COMPREHENSIVE METABOLIC PANEL - Abnormal; Notable for the following:    Chloride 112 (*)    CO2 20 (*)    BUN 32 (*)    Creatinine, Ser 1.68 (*)    Calcium 8.0 (*)    Total Protein 5.5 (*)    Albumin 2.8 (*)    AST 90 (*)    GFR calc non Af Amer 35 (*)    GFR calc Af Amer 41 (*)    All other components within normal limits  CBC WITH DIFFERENTIAL/PLATELET - Abnormal; Notable for the following:    WBC 19.6 (*)    RBC 3.32 (*)    Hemoglobin 10.2 (*)    HCT 30.6 (*)    Platelets 127 (*)    Neutro Abs 18.4 (*)    Lymphs Abs 0.4 (*)    All other components within normal limits  I-STAT CG4 LACTIC ACID, ED - Abnormal; Notable for the following:    Lactic Acid, Venous 6.17 (*)    All other components within normal limits  CULTURE, BLOOD (ROUTINE X 2)  CULTURE, BLOOD (ROUTINE X 2)  CLOSTRIDIUM DIFFICILE BY PCR  URINALYSIS, ROUTINE W REFLEX MICROSCOPIC  CARBAMAZEPINE LEVEL, TOTAL  I-STAT CG4 LACTIC ACID, ED    EKG  EKG Interpretation  Date/Time:  Tuesday April 12 2017 17:06:28 EDT Ventricular Rate:  95 PR Interval:  98 QRS Duration: 70 QT Interval:  376 QTC Calculation: 472 R Axis:   72 Text Interpretation:  Interpretation limited secondary to artifact Abnormal ECG Confirmed by Fredia Sorrow [phone]) on 04/12/2017 5:29:05 PM       Radiology Dg Chest Port 1 View  Result Date: 04/12/2017 CLINICAL DATA:  Hypotension.  Dementia. EXAM: PORTABLE CHEST 1 VIEW COMPARISON:  03/24/2017 and 02/08/2008 FINDINGS: Patient is slightly rotated to the left. Lungs are adequately inflated and demonstrate mild opacification over the left base without significant change which may be due to atelectasis or infection. No evidence of effusion. Cardiomediastinal silhouette and remainder of the exam is unchanged. IMPRESSION: Stable mild opacification over the left base which may be due to atelectasis or  infection. Electronically Signed   By: Marin Olp M.D.   On: 04/12/2017 18:00    Procedures Procedures (including critical care time)   CRITICAL CARE Performed by: Fredia Sorrow Total critical care time: 30 minutes Critical care time was exclusive of separately billable procedures and treating other patients. Critical care was necessary to treat or prevent imminent or life-threatening deterioration. Critical care was time spent personally by me on the following activities: development of treatment plan with patient and/or surrogate as well as nursing, discussions with consultants, evaluation of patient's response to treatment, examination of patient, obtaining history from  patient or surrogate, ordering and performing treatments and interventions, ordering and review of laboratory studies, ordering and review of radiographic studies, pulse oximetry and re-evaluation of patient's condition.  Medications Ordered in ED Medications  0.9 %  sodium chloride infusion (1,000 mLs Intravenous New Bag/Given 04/12/17 1744)  0.9 %  sodium chloride infusion (not administered)  sodium chloride 0.9 % bolus 1,000 mL (1,000 mLs Intravenous New Bag/Given 04/12/17 1954)    And  sodium chloride 0.9 % bolus 1,000 mL (not administered)  levofloxacin (LEVAQUIN) IVPB 750 mg (not administered)  vancomycin (VANCOCIN) IVPB 1000 mg/200 mL premix (not administered)  ceFEPIme (MAXIPIME) 1 g in dextrose 5 % 50 mL IVPB (not administered)  levofloxacin (LEVAQUIN) IVPB 750 mg (not administered)  vancomycin (VANCOCIN) 500 mg in sodium chloride 0.9 % 100 mL IVPB (not administered)  sodium chloride 0.9 % bolus 500 mL (0 mLs Intravenous Stopped 04/12/17 1955)  sodium chloride 0.9 % bolus 500 mL (0 mLs Intravenous Stopped 04/12/17 1955)  ceFEPIme (MAXIPIME) 2 g in dextrose 5 % 50 mL IVPB (0 g Intravenous Stopped 04/12/17 2123)     Initial Impression / Assessment and Plan / ED Course  I have reviewed the triage vital signs and  the nursing notes.  Pertinent labs & imaging results that were available during my care of the patient were reviewed by me and considered in my medical decision making (see chart for details).    Patient from nursing facility. Patient is a DO NOT RESUSCITATE does have a history of dementia. Patient brought in for low blood pressures at nursing facility. The lowest blood pressure we had here today was systolic of 91. No tachycardia. Patient did have a low-grade fever of 100.1. Based on patient's presentation underwent sepsis evaluation. Initially did not have indication for the fluid challenge. The patient's lactic acid and white blood cell count came back elevated so patient received the appropriate amount of fluid challenge. Was started on antibiotics. Patient has penicillin allergy so they were modified.    workup showed a marked leukocytosis. Patient has significant lactic acidosis. Chest x-ray raises some concerns for pneumonia.  As stated patient has dementia but he was alert and can communicate.  Patient also has a history of seizures and is on Tegretol. Level was ordered and is pending.  Discussed with the hospitalist team they will admit. No family members have been present.   Final Clinical Impressions(s) / ED Diagnoses   Final diagnoses:  Sepsis, due to unspecified organism [facility])    New Prescriptions New Prescriptions   No medications on file     Fredia Sorrow, MD 04/12/17 2152

## 2017-04-12 NOTE — ED Notes (Signed)
Multiple staff member to draw labs notified lab

## 2017-04-12 NOTE — ED Notes (Signed)
Attempted to get blood draws.   Informed RN of unsuccessful attempt in right hand. Several attempts for an IV from nurses that was unsuccessful to get draw back.  Need to call Lab for help.

## 2017-04-12 NOTE — Progress Notes (Addendum)
Pharmacy Antibiotic Note  Erik Massey is a 83 y.o. male admitted on 04/12/2017 with sepsis.  Pharmacy has been consulted for vancomycin/cefepime/levofloxacin dosing.  Pt with hypotension during transfer to ED from nursing home.  Recent admission for abdominal pain. Recent discharge on 5/21. Pt on ciprofloxacin and metronidazole PTA.     Plan:  Vancomycin 1000 mg IV x1, then vancomycin 500 mg IV q24h  Levofloxacin 750 mg IV q48hr  Cefepime 2 gr IV x1, then cefepime 1 gr IV q24h  Monitor clinical course, renal function, cultures as available  Pt weight from 5/22 was 56.7 kg. CrCl is 26 ml/min      Temp (24hrs), Avg:100.1 F (37.8 C), Min:100.1 F (37.8 C), Max:100.1 F (37.8 C)   Recent Labs Lab 04/12/17 1854 04/12/17 1908  WBC 19.6*  --   CREATININE 1.68*  --   LATICACIDVEN  --  6.17*    CrCl cannot be calculated (Unknown ideal weight.).    Allergies  Allergen Reactions  . Penicillins Swelling    Has patient had a PCN reaction causing immediate rash, facial/tongue/throat swelling, SOB or lightheadedness with hypotension: yes Has patient had a PCN reaction causing severe rash involving mucus membranes or skin necrosis: no Has patient had a PCN reaction that required hospitalization- yes already in the hospital Has patient had a PCN reaction occurring within the last 10 years: No If all of the above answers are "NO", then may proceed with Cephalosporin use.     Antimicrobials this admission: 6/5 cefepime >>  6/5 vancomycin >>  6/5 levofloxacin  Dose adjustments this admission:   Microbiology results: 6/5 BCx: sent 6/5 c. Diff: needs to collected   Thank you for allowing pharmacy to be a part of this patient's care.   Royetta Asal, PharmD, BCPS Pager [phone] 04/12/2017 8:07 PM

## 2017-04-12 NOTE — ED Notes (Signed)
Patients daughter, [name]: [phone]

## 2017-04-12 NOTE — ED Notes (Signed)
Bed: WA23 Expected date:  Expected time:  Means of arrival:  Comments: EMS-hypotensive 

## 2017-04-13 DIAGNOSIS — R6521 Severe sepsis with septic shock: Secondary | ICD-10-CM

## 2017-04-13 DIAGNOSIS — A419 Sepsis, unspecified organism: Secondary | ICD-10-CM

## 2017-04-13 DIAGNOSIS — N179 Acute kidney failure, unspecified: Secondary | ICD-10-CM

## 2017-04-13 LAB — COMPREHENSIVE METABOLIC PANEL
ALT: 72 U/L — ABNORMAL HIGH (ref 17–63)
AST: 115 U/L — ABNORMAL HIGH (ref 15–41)
Albumin: 2.4 g/dL — ABNORMAL LOW (ref 3.5–5.0)
Alkaline Phosphatase: 87 U/L (ref 38–126)
Anion gap: 10 (ref 5–15)
BUN: 30 mg/dL — ABNORMAL HIGH (ref 6–20)
CO2: 18 mmol/L — ABNORMAL LOW (ref 22–32)
Calcium: 7.2 mg/dL — ABNORMAL LOW (ref 8.9–10.3)
Chloride: 115 mmol/L — ABNORMAL HIGH (ref 101–111)
Creatinine, Ser: 1.49 mg/dL — ABNORMAL HIGH (ref 0.61–1.24)
GFR calc Af Amer: 48 mL/min — ABNORMAL LOW (ref >60)
GFR calc non Af Amer: 41 mL/min — ABNORMAL LOW (ref >60)
Glucose, Bld: 92 mg/dL (ref 65–99)
Potassium: 3.7 mmol/L (ref 3.5–5.1)
Sodium: 143 mmol/L (ref 135–145)
Total Bilirubin: 0.5 mg/dL (ref 0.3–1.2)
Total Protein: 5 g/dL — ABNORMAL LOW (ref 6.5–8.1)

## 2017-04-13 LAB — BLOOD CULTURE ID PANEL (REFLEXED)
Acinetobacter baumannii: NOT DETECTED
Acinetobacter baumannii: NOT DETECTED
CANDIDA ALBICANS: NOT DETECTED
CANDIDA PARAPSILOSIS: NOT DETECTED
CANDIDA TROPICALIS: NOT DETECTED
Candida albicans: NOT DETECTED
Candida glabrata: NOT DETECTED
Candida glabrata: NOT DETECTED
Candida krusei: NOT DETECTED
Candida krusei: NOT DETECTED
Candida parapsilosis: NOT DETECTED
Candida tropicalis: NOT DETECTED
ENTEROBACTERIACEAE SPECIES: NOT DETECTED
ENTEROCOCCUS SPECIES: NOT DETECTED
ESCHERICHIA COLI: NOT DETECTED
Enterobacter cloacae complex: NOT DETECTED
Enterobacter cloacae complex: NOT DETECTED
Enterobacteriaceae species: NOT DETECTED
Enterococcus species: DETECTED — AB
Escherichia coli: NOT DETECTED
HAEMOPHILUS INFLUENZAE: NOT DETECTED
Haemophilus influenzae: NOT DETECTED
KLEBSIELLA OXYTOCA: NOT DETECTED
KLEBSIELLA PNEUMONIAE: NOT DETECTED
Klebsiella oxytoca: NOT DETECTED
Klebsiella pneumoniae: NOT DETECTED
LISTERIA MONOCYTOGENES: NOT DETECTED
Listeria monocytogenes: NOT DETECTED
NEISSERIA MENINGITIDIS: NOT DETECTED
Neisseria meningitidis: NOT DETECTED
PROTEUS SPECIES: NOT DETECTED
PSEUDOMONAS AERUGINOSA: NOT DETECTED
Proteus species: NOT DETECTED
Pseudomonas aeruginosa: NOT DETECTED
SERRATIA MARCESCENS: NOT DETECTED
SERRATIA MARCESCENS: NOT DETECTED
STAPHYLOCOCCUS AUREUS BCID: NOT DETECTED
STAPHYLOCOCCUS AUREUS BCID: NOT DETECTED
STAPHYLOCOCCUS SPECIES: NOT DETECTED
STREPTOCOCCUS AGALACTIAE: NOT DETECTED
STREPTOCOCCUS PNEUMONIAE: NOT DETECTED
STREPTOCOCCUS PYOGENES: NOT DETECTED
STREPTOCOCCUS PYOGENES: NOT DETECTED
Staphylococcus species: NOT DETECTED
Streptococcus agalactiae: NOT DETECTED
Streptococcus pneumoniae: NOT DETECTED
Streptococcus species: NOT DETECTED
Streptococcus species: NOT DETECTED
Vancomycin resistance: NOT DETECTED

## 2017-04-13 LAB — CBC WITH DIFFERENTIAL/PLATELET
Basophils Absolute: 0 10*3/uL (ref 0.0–0.1)
Basophils Relative: 0 %
Eosinophils Absolute: 0 10*3/uL (ref 0.0–0.7)
Eosinophils Relative: 0 %
HCT: 29.5 % — ABNORMAL LOW (ref 39.0–52.0)
Hemoglobin: 9.8 g/dL — ABNORMAL LOW (ref 13.0–17.0)
Lymphocytes Relative: 2 %
Lymphs Abs: 0.4 10*3/uL — ABNORMAL LOW (ref 0.7–4.0)
MCH: 30.4 pg (ref 26.0–34.0)
MCHC: 33.2 g/dL (ref 30.0–36.0)
MCV: 91.6 fL (ref 78.0–100.0)
Monocytes Absolute: 1 10*3/uL (ref 0.1–1.0)
Monocytes Relative: 5 %
Neutro Abs: 18.3 10*3/uL — ABNORMAL HIGH (ref 1.7–7.7)
Neutrophils Relative %: 93 %
Platelets: 110 10*3/uL — ABNORMAL LOW (ref 150–400)
RBC: 3.22 MIL/uL — ABNORMAL LOW (ref 4.22–5.81)
RDW: 15.3 % (ref 11.5–15.5)
WBC: 19.7 10*3/uL — ABNORMAL HIGH (ref 4.0–10.5)

## 2017-04-13 LAB — GLUCOSE, CAPILLARY
GLUCOSE-CAPILLARY: 58 mg/dL — AB (ref 65–99)
GLUCOSE-CAPILLARY: 62 mg/dL — AB (ref 65–99)
GLUCOSE-CAPILLARY: 66 mg/dL (ref 65–99)
Glucose-Capillary: 131 mg/dL — ABNORMAL HIGH (ref 65–99)
Glucose-Capillary: 49 mg/dL — ABNORMAL LOW (ref 65–99)
Glucose-Capillary: 83 mg/dL (ref 65–99)
Glucose-Capillary: 63 mg/dL — ABNORMAL LOW (ref 65–99)
Glucose-Capillary: 82 mg/dL (ref 65–99)

## 2017-04-13 LAB — CBC
HCT: 28.4 % — ABNORMAL LOW (ref 39.0–52.0)
Hemoglobin: 9.6 g/dL — ABNORMAL LOW (ref 13.0–17.0)
MCH: 30.8 pg (ref 26.0–34.0)
MCHC: 33.8 g/dL (ref 30.0–36.0)
MCV: 91 fL (ref 78.0–100.0)
Platelets: 88 10*3/uL — ABNORMAL LOW (ref 150–400)
RBC: 3.12 MIL/uL — ABNORMAL LOW (ref 4.22–5.81)
RDW: 15.2 % (ref 11.5–15.5)
WBC: 13.3 10*3/uL — ABNORMAL HIGH (ref 4.0–10.5)

## 2017-04-13 LAB — URINALYSIS, MICROSCOPIC (REFLEX): Squamous Epithelial / LPF: NONE SEEN

## 2017-04-13 LAB — CARBAMAZEPINE LEVEL, TOTAL: CARBAMAZEPINE LVL: 2.2 ug/mL — AB (ref 4.0–12.0)

## 2017-04-13 LAB — URINALYSIS, ROUTINE W REFLEX MICROSCOPIC
Bilirubin Urine: NEGATIVE
GLUCOSE, UA: NEGATIVE mg/dL
KETONES UR: NEGATIVE mg/dL
Nitrite: POSITIVE — AB
PH: 6 (ref 5.0–8.0)
PROTEIN: 100 mg/dL — AB
Specific Gravity, Urine: 1.013 (ref 1.005–1.030)

## 2017-04-13 LAB — LACTIC ACID, PLASMA
Lactic Acid, Venous: 4.6 mmol/L (ref 0.5–1.9)
Lactic Acid, Venous: 3.7 mmol/L (ref 0.5–1.9)

## 2017-04-13 LAB — PROTIME-INR
INR: 1.26
Prothrombin Time: 15.8 seconds — ABNORMAL HIGH (ref 11.4–15.2)

## 2017-04-13 LAB — MRSA PCR SCREENING: MRSA BY PCR: NEGATIVE

## 2017-04-13 LAB — PROCALCITONIN: Procalcitonin: 106.03 ng/mL

## 2017-04-13 LAB — CORTISOL: Cortisol, Plasma: 30.1 ug/dL

## 2017-04-13 LAB — TYPE AND SCREEN
ABO/RH(D): B POS
Antibody Screen: NEGATIVE

## 2017-04-13 LAB — APTT: aPTT: 31 seconds (ref 24–36)

## 2017-04-13 LAB — CLOSTRIDIUM DIFFICILE BY PCR: CDIFFPCR: POSITIVE — AB

## 2017-04-13 LAB — ABO/RH: ABO/RH(D): B POS

## 2017-04-13 MED ORDER — DEXTROSE-NACL 5-0.45 % IV SOLN
INTRAVENOUS | Status: DC
  Administered 2017-04-13 – 2017-04-14 (×2): via INTRAVENOUS

## 2017-04-13 MED ORDER — DEXTROSE 50 % IV SOLN
INTRAVENOUS | Status: AC
  Administered 2017-04-13: 50 mL
  Filled 2017-04-13: qty 50

## 2017-04-13 MED ORDER — PHENYLEPHRINE HCL-NACL 10-0.9 MG/250ML-% IV SOLN
0.0000 ug/min | INTRAVENOUS | Status: DC
  Administered 2017-04-13 (×2): 120 ug/min via INTRAVENOUS
  Administered 2017-04-13: 20 ug/min via INTRAVENOUS
  Administered 2017-04-13 (×2): 60 ug/min via INTRAVENOUS
  Administered 2017-04-13: 50 ug/min via INTRAVENOUS
  Administered 2017-04-13 (×2): 110 ug/min via INTRAVENOUS
  Administered 2017-04-13: 60 ug/min via INTRAVENOUS
  Administered 2017-04-14: 15 ug/min via INTRAVENOUS
  Administered 2017-04-14: 40 ug/min via INTRAVENOUS
  Administered 2017-04-14: 10 ug/min via INTRAVENOUS
  Administered 2017-04-14: 50 ug/min via INTRAVENOUS
  Filled 2017-04-13 (×10): qty 250
  Filled 2017-04-13: qty 500
  Filled 2017-04-13: qty 250

## 2017-04-13 MED ORDER — METRONIDAZOLE IN NACL 5-0.79 MG/ML-% IV SOLN
500.0000 mg | Freq: Three times a day (TID) | INTRAVENOUS | Status: DC
  Administered 2017-04-13 – 2017-04-14 (×4): 500 mg via INTRAVENOUS
  Filled 2017-04-13 (×5): qty 100

## 2017-04-13 MED ORDER — VANCOMYCIN HCL IN DEXTROSE 750-5 MG/150ML-% IV SOLN: 750.0000 mg | INTRAVENOUS | Status: DC

## 2017-04-13 MED ORDER — DEXTROSE 50 % IV SOLN
25.0000 mL | Freq: Once | INTRAVENOUS | Status: AC
  Administered 2017-04-13: 25 mL via INTRAVENOUS

## 2017-04-13 MED ORDER — DEXTROSE 50 % IV SOLN
25.0000 mL | Freq: Once | INTRAVENOUS | Status: AC
  Administered 2017-04-13: 25 mL via INTRAVENOUS
  Filled 2017-04-13: qty 50

## 2017-04-13 MED ORDER — DEXTROSE 50 % IV SOLN
1.0000 | Freq: Once | INTRAVENOUS | Status: AC
  Administered 2017-04-13: 25 mL via INTRAVENOUS

## 2017-04-13 MED ORDER — VANCOMYCIN HCL IN DEXTROSE 750-5 MG/150ML-% IV SOLN
750.0000 mg | INTRAVENOUS | Status: DC
  Administered 2017-04-13: 750 mg via INTRAVENOUS
  Filled 2017-04-13: qty 150

## 2017-04-13 MED ORDER — VANCOMYCIN 50 MG/ML ORAL SOLUTION
125.0000 mg | Freq: Four times a day (QID) | ORAL | Status: DC
  Administered 2017-04-13 – 2017-04-14 (×4): 125 mg via ORAL
  Filled 2017-04-13 (×6): qty 2.5

## 2017-04-13 MED ORDER — DEXTROSE 5 % IV SOLN
2.0000 g | INTRAVENOUS | Status: DC
  Administered 2017-04-13: 2 g via INTRAVENOUS
  Filled 2017-04-13 (×2): qty 2

## 2017-04-13 MED ORDER — DEXTROSE 50 % IV SOLN
INTRAVENOUS | Status: AC
  Administered 2017-04-13: 25 mL via INTRAVENOUS
  Filled 2017-04-13: qty 50

## 2017-04-13 MED ORDER — DEXTROSE 50 % IV SOLN
0.5000 | Freq: Once | INTRAVENOUS | Status: AC
  Administered 2017-04-13: 25 mL via INTRAVENOUS

## 2017-04-13 NOTE — Progress Notes (Signed)
Pharmacy Antibiotic Note  Erik Massey is a 83 y.o. male admitted on 04/12/2017 with sepsis.  Pharmacy has been consulted for vancomycin/cefepime/levofloxacin dosing.  Pt with hypotension during transfer to ED from nursing home.  Recent admission for abdominal pain. Recent discharge on 5/21. Pt on ciprofloxacin and metronidazole PTA.     Plan:  Increase vancomycin to 750 mg IV q24h (received 1g IV load yesterday 6/5)  Continue cefepime 1 gr IV q24h  Monitor clinical course, renal function, cultures as available   Height: 5\' 9"  (175.3 cm) Weight: 131 lb 6.3 oz (59.6 kg) IBW/kg (Calculated) : 70.7  Temp (24hrs), Avg:98.8 F (37.1 C), Min:97.7 F (36.5 C), Max:100.1 F (37.8 C)   Recent Labs Lab 04/12/17 1854 04/12/17 1908 04/12/17 2201 04/13/17 0109 04/13/17 0306 04/13/17 0524  WBC 19.6*  --   --   --  13.3* 19.7*  CREATININE 1.68*  --   --   --   --  1.49*  LATICACIDVEN  --  6.17* 5.15* 4.6*  --  3.7*    Estimated Creatinine Clearance: 30.6 mL/min (A) (by C-G formula based on SCr of 1.49 mg/dL (H)).    Allergies  Allergen Reactions  . Penicillins Swelling    Has patient had a PCN reaction causing immediate rash, facial/tongue/throat swelling, SOB or lightheadedness with hypotension: yes Has patient had a PCN reaction causing severe rash involving mucus membranes or skin necrosis: no Has patient had a PCN reaction that required hospitalization- yes already in the hospital Has patient had a PCN reaction occurring within the last 10 years: No If all of the above answers are "NO", then may proceed with Cephalosporin use.     Antimicrobials this admission:  6/5 cefepime>> 6/5 vancomycin>> 6/5 levofloxacin >> 6/6  Dose adjustments this admission:  6/6 increase vancomycin from 500mg  IV q24h to 750mg  q24h for improved SCr  Microbiology results:  6/5BCx: sent 6/5 C.Diff Ag/PCR: POSITIVE, Toxin: NEGATIVE (no stools per RN) 6/6 UCx: sent   Thank you  for allowing pharmacy to be a part of this patient's care.   Peggyann Juba, PharmD, BCPS Pager: [phone] 04/13/2017 9:31 AM

## 2017-04-13 NOTE — Progress Notes (Signed)
PHARMACY - PHYSICIAN COMMUNICATION CRITICAL VALUE ALERT - BLOOD CULTURE IDENTIFICATION (BCID)  Results for orders placed or performed during the hospital encounter of 04/12/17  Blood Culture ID Panel (Reflexed) (Collected: 04/12/2017 10:20 PM)  Result Value Ref Range   Enterococcus species DETECTED (A) NOT DETECTED   Vancomycin resistance NOT DETECTED NOT DETECTED   Listeria monocytogenes NOT DETECTED NOT DETECTED   Staphylococcus species NOT DETECTED NOT DETECTED   Staphylococcus aureus NOT DETECTED NOT DETECTED   Streptococcus species NOT DETECTED NOT DETECTED   Streptococcus agalactiae NOT DETECTED NOT DETECTED   Streptococcus pneumoniae NOT DETECTED NOT DETECTED   Streptococcus pyogenes NOT DETECTED NOT DETECTED   Acinetobacter baumannii NOT DETECTED NOT DETECTED   Enterobacteriaceae species NOT DETECTED NOT DETECTED   Enterobacter cloacae complex NOT DETECTED NOT DETECTED   Escherichia coli NOT DETECTED NOT DETECTED   Klebsiella oxytoca NOT DETECTED NOT DETECTED   Klebsiella pneumoniae NOT DETECTED NOT DETECTED   Proteus species NOT DETECTED NOT DETECTED   Serratia marcescens NOT DETECTED NOT DETECTED   Haemophilus influenzae NOT DETECTED NOT DETECTED   Neisseria meningitidis NOT DETECTED NOT DETECTED   Pseudomonas aeruginosa NOT DETECTED NOT DETECTED   Candida albicans NOT DETECTED NOT DETECTED   Candida glabrata NOT DETECTED NOT DETECTED   Candida krusei NOT DETECTED NOT DETECTED   Candida parapsilosis NOT DETECTED NOT DETECTED   Candida tropicalis NOT DETECTED NOT DETECTED    Name of physician (or Provider) Contacted: Dr. Oletta Darter  Changes to prescribed antibiotics required: resume vancomycin   Clovis Riley 04/13/2017  6:16 PM

## 2017-04-13 NOTE — Progress Notes (Signed)
Pharmacy Antibiotic Note  Erik Massey is a 83 y.o. male admitted on 04/12/2017 with sepsis.  Pharmacy has been consulted for vancomycin/cefepime/levofloxacin dosing.  Pt with hypotension during transfer to ED from nursing home.  Recent admission for abdominal pain. Recent discharge on 5/21. Pt on ciprofloxacin and metronidazole PTA.   PM update: BCID identifying GNR (unable to identify organism) - continue cefepime and now enterococcus (no VAN A/B gene detected) but patient with PCN allergy  Plan:  Resume vancomycin to 750 mg IV q24h (received 1g IV load yesterday 6/5)  Increase cefepime to 2 gm IV q24h for bacteremia  Monitor clinical course, renal function, cultures as available   Height: 5\' 9"  (175.3 cm) Weight: 131 lb 6.3 oz (59.6 kg) IBW/kg (Calculated) : 70.7  Temp (24hrs), Avg:98.1 F (36.7 C), Min:97.6 F (36.4 C), Max:99.2 F (37.3 C)   Recent Labs Lab 04/12/17 1854 04/12/17 1908 04/12/17 2201 04/13/17 0109 04/13/17 0306 04/13/17 0524  WBC 19.6*  --   --   --  13.3* 19.7*  CREATININE 1.68*  --   --   --   --  1.49*  LATICACIDVEN  --  6.17* 5.15* 4.6*  --  3.7*    Estimated Creatinine Clearance: 30.6 mL/min (A) (by C-G formula based on SCr of 1.49 mg/dL (H)).    Allergies  Allergen Reactions  . Penicillins Swelling    Has patient had a PCN reaction causing immediate rash, facial/tongue/throat swelling, SOB or lightheadedness with hypotension: yes Has patient had a PCN reaction causing severe rash involving mucus membranes or skin necrosis: no Has patient had a PCN reaction that required hospitalization- yes already in the hospital Has patient had a PCN reaction occurring within the last 10 years: No If all of the above answers are "NO", then may proceed with Cephalosporin use.     Antimicrobials this admission:  6/5 cefepime>> 6/5 vancomycin>> 6/5 levofloxacin >> 6/6  Dose adjustments this admission:  6/6 increase vancomycin from 500mg  IV  q24h to 750mg  q24h for improved SCr  Microbiology results:  6/5BCx: sent 6/5 C.Diff Ag/PCR: POSITIVE, Toxin: NEGATIVE (no stools per RN) 6/6 UCx: sent   Thank you for allowing pharmacy to be a part of this patient's care.  Doreene Eland, PharmD, BCPS.   Pager: [phone] 04/13/2017 6:28 PM

## 2017-04-13 NOTE — Care Management Note (Signed)
Case Management Note  Patient Details  Name: Erik Massey MRN: [id number] Date of Birth: Jan 17, 1929  Subjective/Objective:                  83 y.o. 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 As per the patient's daughter who provided the history patient was doing fine until 2 days ago when patient became increasingly lethargic weak and more bed bound. Patient was brought to the occupational getting hypotensive and lethargic and increasing hematuria.   ED Course: In the ER patient was found to be hypotensive with Foley catheter draining hematuria. Patient also had a large loose bowel movement in the ER. Chest x-ray shows possible pneumonic process. Blood cultures urine cultures were obtained and patient was started empirically antibiotics for sepsis and fluid started.  Action/Plan: Date:  April 13, 2017 Chart reviewed for concurrent status and case management needs. Will continue to follow patient progress. Discharge Planning: following for needs Expected discharge date: [id number] Velva Harman, BSN, [place], [place]  Expected Discharge Date:   (unknown)               Expected Discharge Plan:  [place]  In-House Referral:  Clinical Social Work  Discharge planning Services  CM Consult  Post Acute Care Choice:    Choice offered to:     DME Arranged:    DME Agency:     HH Arranged:    [place] Agency:     Status of Service:  In process, will continue to follow  If discussed at Long Length of Stay Meetings, dates discussed:    Additional Comments:  Leeroy Cha, RN 04/13/2017, 8:53  AM

## 2017-04-13 NOTE — Consult Note (Signed)
PULMONARY / CRITICAL CARE MEDICINE   Name: JOSHUAL TERRIO MRN: [id number] DOB: 1928-11-20    ADMISSION DATE:  04/12/2017 CONSULTATION DATE:  6/6  REFERRING MD:  [name]   CHIEF COMPLAINT:  Shock  HISTORY OF PRESENT ILLNESS:   Patient is encephalopathic; therefore history has been obtained from chart review. 83 year old male with PMH as below, which is significant for seizure disorder on tegretol and dementia. He lives with his duaghter who is an internal medicine physician here at [facility]. He is known to be DNR. He was recently admitted for urinary retention secondary to enlarged prostate and chronic cystitis and discharged to SNF for rehab where he had been doing well. Then starting about 6/3 he began to have some alteration in mental status and hematuria. 6/5 he was noted to be hypotensive and was transferred to [facility]. Upon arrival his SBP was in the high 90s so he was not give volume at first, but lacitc acid was elevated at 6. WBC 19.6. Concern for sepsis arose and he was given IVF to which he responded well. He was treated with broad spectrum antibiotics and admitted to the hospitalist service in SDU. Despite these treatments his blood pressure remained low and lactic was clearing very slowly. PCCM asked to see for further evaluation.    PAST MEDICAL HISTORY :  He  has a past medical history of Alzheimer disease and Seizures ([place]).  PAST SURGICAL HISTORY: He  has a past surgical history that includes Knee surgery.  Allergies  Allergen Reactions  . Penicillins Swelling    Has patient had a PCN reaction causing immediate rash, facial/tongue/throat swelling, SOB or lightheadedness with hypotension: yes Has patient had a PCN reaction causing severe rash involving mucus membranes or skin necrosis: no Has patient had a PCN reaction that required hospitalization- yes already in the hospital Has patient had a PCN reaction occurring within the last 10 years: No If all of  the above answers are "NO", then may proceed with Cephalosporin use.     No current facility-administered medications on file prior to encounter.    Current Outpatient Prescriptions on File Prior to Encounter  Medication Sig  . ARMOUR THYROID 90 MG tablet Take 90 mg by mouth daily.  Marland Kitchen BETOPTIC-S 0.25 % ophthalmic suspension Place 1 drop into the right eye daily.   . carbamazepine (TEGRETOL XR) 400 MG 12 hr tablet Take 1 tablet (400 mg total) by mouth 2 (two) times daily.  . dorzolamide (TRUSOPT) 2 % ophthalmic solution Place 1 drop into the right eye every 12 (twelve) hours.   . finasteride (PROSCAR) 5 MG tablet Take 1 tablet (5 mg total) by mouth daily.  Marland Kitchen latanoprost (XALATAN) 0.005 % ophthalmic solution Place 1 drop into the right eye at bedtime.   . magnesium oxide (MAG-OX) 400 (241.3 Mg) MG tablet Take 1 tablet (400 mg total) by mouth daily.  . polyethylene glycol (MIRALAX / GLYCOLAX) packet Take 17 g by mouth daily.  Marland Kitchen senna-docusate (SENOKOT-S) 8.6-50 MG tablet Take 1 tablet by mouth 2 (two) times daily.  . tamsulosin (FLOMAX) 0.4 MG CAPS capsule Take 1 capsule (0.4 mg total) by mouth daily.    FAMILY HISTORY:  His has no family status information on file.    SOCIAL HISTORY: He  reports that he has never smoked. He has never used smokeless tobacco. He reports that he does not drink alcohol.  REVIEW OF SYSTEMS:   Unable to assess due to AMS  SUBJECTIVE:    VITAL SIGNS: BP (!) 76/48   Pulse 99   Temp 99.2 F (37.3 C) (Axillary)   Resp (!) 26   Ht 5\' 9"  (1.753 m)   Wt 59.6 kg (131 lb 6.3 oz)   SpO2 100%   BMI 19.40 kg/m   HEMODYNAMICS:    VENTILATOR SETTINGS:    INTAKE / OUTPUT: No intake/output data recorded.  PHYSICAL EXAMINATION: General:  Frail elderly male in NAD Neuro:  Non-verbal. Does nod and shake head. Follows basic commands. Seems to be his baseline per RN HEENT:  [place]/AT, PERRL, no JVD Cardiovascular:  RRR, no MRG Lungs:  Clear,  diminished Abdomen:  Soft, non-distended Musculoskeletal:  No acute deformity or ROM limitation. Skin:  Grossly intact  LABS:  BMET  Recent Labs Lab 04/12/17 1854  NA 144  K 3.7  CL 112*  CO2 20*  BUN 32*  CREATININE 1.68*  GLUCOSE 79    Electrolytes  Recent Labs Lab 04/12/17 1854  CALCIUM 8.0*    CBC  Recent Labs Lab 04/12/17 1854  WBC 19.6*  HGB 10.2*  HCT 30.6*  PLT 127*    Coag's  Recent Labs Lab 04/13/17 0109  APTT 31  INR 1.26    Sepsis Markers  Recent Labs Lab 04/12/17 1908 04/12/17 2201  LATICACIDVEN 6.17* 5.15*    ABG No results for input(s): PHART, PCO2ART, PO2ART in the last 168 hours.  Liver Enzymes  Recent Labs Lab 04/12/17 1854  AST 90*  ALT 57  ALKPHOS 103  BILITOT 0.6  ALBUMIN 2.8*    Cardiac Enzymes No results for input(s): TROPONINI, PROBNP in the last 168 hours.  Glucose  Recent Labs Lab 04/13/17 0111  GLUCAP 58*    Imaging Dg Chest Port 1 View  Result Date: 04/12/2017 CLINICAL DATA:  Hypotension.  Dementia. EXAM: PORTABLE CHEST 1 VIEW COMPARISON:  03/24/2017 and 02/08/2008 FINDINGS: Patient is slightly rotated to the left. Lungs are adequately inflated and demonstrate mild opacification over the left base without significant change which may be due to atelectasis or infection. No evidence of effusion. Cardiomediastinal silhouette and remainder of the exam is unchanged. IMPRESSION: Stable mild opacification over the left base which may be due to atelectasis or infection. Electronically Signed   By: Marin Olp M.D.   On: 04/12/2017 18:00     STUDIES:    CULTURES: Blood cultures 6/5 > C-dif 6/5 > antigen positive, toxin neg  ANTIBIOTICS: Cefepime  6/5 > Levaquin 6/5 Vancomycin 6/5 >  SIGNIFICANT EVENTS: 5/17 > 5/21 admit for urinary retention 6/5 > admit for sepsis  LINES/TUBES:   DISCUSSION: 84 year old male with dementia and recent admit for urinary retention now presenting with septic  shock. Source unclear. UA pending, figure this is likely source due to recent history and character of urine.   ASSESSMENT / PLAN:  PULMONARY A: No acute issues  P:   Monitor  CARDIOVASCULAR A:  Shock: likely septic shock with component of hypovolemia.  P:  Telemetry monitoring in ICU MAP goal > 55mmHg S/p 5L IVF Phenylephrine as needed for MAP goal DNR if arrests  RENAL A:   AKI in setting shock  P:   Repeat BMP s/p volume Strict I&O  GASTROINTESTINAL A:   malnutrition  P:   NPO Protonix for SUP  HEMATOLOGIC A:   Anemia (hgb close to baseline)  P:  Follow CBC Coags  INFECTIOUS A:   Sepsis: likely secondary to recurrent UTI. Questionable opacification on CXR, but  this seems chronic.   P:   UA pending Follow cultures DC levaquin Cefepime& vanco  ENDOCRINE A:   No acute issues  P:   Follow glucose  NEUROLOGIC A:   Acute metabolic encephalopathy Alzheimer's dementia  P:   RASS goal: 0 Monitor   FAMILY  - Updates: Daughter updated bedside  - Inter-disciplinary family meet or Palliative Care meeting due by:  6/12   Georgann Housekeeper, AGACNP-BC [place] [facility] Pager [phone] or [phone]  04/13/2017 2:30 AM

## 2017-04-13 NOTE — Progress Notes (Signed)
Patient is on iv pressor support fore septic shock in ICU under PCCM care. D/w Dr. Elsworth Soho, hospitalist will sign off at this time.  Kinnie Feil

## 2017-04-13 NOTE — Progress Notes (Signed)
PHARMACY - PHYSICIAN COMMUNICATION CRITICAL VALUE ALERT - BLOOD CULTURE IDENTIFICATION (BCID)  Results for orders placed or performed during the hospital encounter of 04/12/17  Blood Culture ID Panel (Reflexed) (Collected: 04/12/2017  6:50 PM)  Result Value Ref Range   Enterococcus species NOT DETECTED NOT DETECTED   Listeria monocytogenes NOT DETECTED NOT DETECTED   Staphylococcus species NOT DETECTED NOT DETECTED   Staphylococcus aureus NOT DETECTED NOT DETECTED   Streptococcus species NOT DETECTED NOT DETECTED   Streptococcus agalactiae NOT DETECTED NOT DETECTED   Streptococcus pneumoniae NOT DETECTED NOT DETECTED   Streptococcus pyogenes NOT DETECTED NOT DETECTED   Acinetobacter baumannii NOT DETECTED NOT DETECTED   Enterobacteriaceae species NOT DETECTED NOT DETECTED   Enterobacter cloacae complex NOT DETECTED NOT DETECTED   Escherichia coli NOT DETECTED NOT DETECTED   Klebsiella oxytoca NOT DETECTED NOT DETECTED   Klebsiella pneumoniae NOT DETECTED NOT DETECTED   Proteus species NOT DETECTED NOT DETECTED   Serratia marcescens NOT DETECTED NOT DETECTED   Haemophilus influenzae NOT DETECTED NOT DETECTED   Neisseria meningitidis NOT DETECTED NOT DETECTED   Pseudomonas aeruginosa NOT DETECTED NOT DETECTED   Candida albicans NOT DETECTED NOT DETECTED   Candida glabrata NOT DETECTED NOT DETECTED   Candida krusei NOT DETECTED NOT DETECTED   Candida parapsilosis NOT DETECTED NOT DETECTED   Candida tropicalis NOT DETECTED NOT DETECTED    Name of physician (or Provider) Contacted: Dr. Oletta Darter  Changes to prescribed antibiotics required: continue cefepime Lynelle Doctor 04/13/2017  3:32 PM

## 2017-04-13 NOTE — Progress Notes (Addendum)
Pt's blood sugar taken by the tech at 2000 was 59. Elink was notified and 1/2 and amp of 50% dextrose was given IV. 15 minutes later the BS was 90 taken by myself, RN. Dr. Oletta Darter called back and increased D51/2NS gtt to 100 mL/hr. Hypoglycemia protocol call was also ordered

## 2017-04-13 NOTE — Progress Notes (Signed)
[place] Progress Note Patient Name: Erik Massey DOB: 03/01/1929 MRN: [id number]   Date of Service  04/13/2017  HPI/Events of Note  Hypoglycemia - Blood glucose = 62 >> 83 >> 63 >> 66  >> 82.  eICU Interventions  Will order: 1. Routine hypoglycemia orders.  2. Increase D5 0.45 NaCl IV fluid to 100 mL/hour.     >>   Intervention Category Major Interventions: Other:  Lysle Dingwall 04/13/2017, 8:38 PM

## 2017-04-14 DIAGNOSIS — D696 Thrombocytopenia, unspecified: Secondary | ICD-10-CM

## 2017-04-14 DIAGNOSIS — G309 Alzheimer's disease, unspecified: Secondary | ICD-10-CM

## 2017-04-14 DIAGNOSIS — R197 Diarrhea, unspecified: Secondary | ICD-10-CM

## 2017-04-14 DIAGNOSIS — N390 Urinary tract infection, site not specified: Secondary | ICD-10-CM

## 2017-04-14 DIAGNOSIS — R569 Unspecified convulsions: Secondary | ICD-10-CM

## 2017-04-14 DIAGNOSIS — D649 Anemia, unspecified: Secondary | ICD-10-CM

## 2017-04-14 DIAGNOSIS — R7881 Bacteremia: Secondary | ICD-10-CM | POA: Diagnosis present

## 2017-04-14 DIAGNOSIS — F0390 Unspecified dementia without behavioral disturbance: Secondary | ICD-10-CM | POA: Diagnosis present

## 2017-04-14 DIAGNOSIS — Z228 Carrier of other infectious diseases: Secondary | ICD-10-CM

## 2017-04-14 DIAGNOSIS — E440 Moderate protein-calorie malnutrition: Secondary | ICD-10-CM

## 2017-04-14 DIAGNOSIS — T83511A Infection and inflammatory reaction due to indwelling urethral catheter, initial encounter: Principal | ICD-10-CM

## 2017-04-14 DIAGNOSIS — F0280 Dementia in other diseases classified elsewhere without behavioral disturbance: Secondary | ICD-10-CM

## 2017-04-14 DIAGNOSIS — B952 Enterococcus as the cause of diseases classified elsewhere: Secondary | ICD-10-CM | POA: Diagnosis present

## 2017-04-14 LAB — GLUCOSE, CAPILLARY
GLUCOSE-CAPILLARY: 102 mg/dL — AB (ref 65–99)
GLUCOSE-CAPILLARY: 58 mg/dL — AB (ref 65–99)
GLUCOSE-CAPILLARY: 61 mg/dL — AB (ref 65–99)
GLUCOSE-CAPILLARY: 94 mg/dL (ref 65–99)
GLUCOSE-CAPILLARY: 97 mg/dL (ref 65–99)
Glucose-Capillary: 61 mg/dL — ABNORMAL LOW (ref 65–99)
Glucose-Capillary: 65 mg/dL (ref 65–99)
Glucose-Capillary: 90 mg/dL (ref 65–99)
Glucose-Capillary: 98 mg/dL (ref 65–99)
Glucose-Capillary: 109 mg/dL — ABNORMAL HIGH (ref 65–99)
Glucose-Capillary: 64 mg/dL — ABNORMAL LOW (ref 65–99)
Glucose-Capillary: 110 mg/dL — ABNORMAL HIGH (ref 65–99)
Glucose-Capillary: 128 mg/dL — ABNORMAL HIGH (ref 65–99)

## 2017-04-14 LAB — BASIC METABOLIC PANEL
ANION GAP: 4 — AB (ref 5–15)
BUN: 26 mg/dL — ABNORMAL HIGH (ref 6–20)
CALCIUM: 7.1 mg/dL — AB (ref 8.9–10.3)
CHLORIDE: 117 mmol/L — AB (ref 101–111)
CO2: 20 mmol/L — AB (ref 22–32)
Creatinine, Ser: 1.24 mg/dL (ref 0.61–1.24)
GFR calc non Af Amer: 51 mL/min — ABNORMAL LOW (ref >60)
GFR, EST AFRICAN AMERICAN: 59 mL/min — AB (ref >60)
GLUCOSE: 78 mg/dL (ref 65–99)
Potassium: 3.5 mmol/L (ref 3.5–5.1)
Sodium: 141 mmol/L (ref 135–145)

## 2017-04-14 LAB — CBC
HEMATOCRIT: 26 % — AB (ref 39.0–52.0)
HEMOGLOBIN: 8.9 g/dL — AB (ref 13.0–17.0)
MCH: 30.6 pg (ref 26.0–34.0)
MCHC: 34.2 g/dL (ref 30.0–36.0)
MCV: 89.3 fL (ref 78.0–100.0)
Platelets: 89 10*3/uL — ABNORMAL LOW (ref 150–400)
RBC: 2.91 MIL/uL — ABNORMAL LOW (ref 4.22–5.81)
RDW: 15.3 % (ref 11.5–15.5)
WBC: 16.9 10*3/uL — AB (ref 4.0–10.5)

## 2017-04-14 LAB — URINE CULTURE

## 2017-04-14 LAB — PHOSPHORUS: PHOSPHORUS: 1.8 mg/dL — AB (ref 2.5–4.6)

## 2017-04-14 LAB — MAGNESIUM: Magnesium: 1.8 mg/dL (ref 1.7–2.4)

## 2017-04-14 MED ORDER — DEXTROSE 10 % IV SOLN
INTRAVENOUS | Status: DC
  Administered 2017-04-14 – 2017-04-19 (×11): via INTRAVENOUS
  Filled 2017-04-14 (×2): qty 1000

## 2017-04-14 MED ORDER — POTASSIUM PHOSPHATES 15 MMOLE/5ML IV SOLN
20.0000 mmol | Freq: Once | INTRAVENOUS | Status: AC
  Administered 2017-04-14: 20 mmol via INTRAVENOUS
  Filled 2017-04-14: qty 6.67

## 2017-04-14 MED ORDER — DEXTROSE 50 % IV SOLN
0.5000 | Freq: Once | INTRAVENOUS | Status: AC
  Administered 2017-04-14: 25 mL via INTRAVENOUS

## 2017-04-14 MED ORDER — SODIUM CHLORIDE 0.9 % IV BOLUS (SEPSIS)
1000.0000 mL | Freq: Once | INTRAVENOUS | Status: AC
  Administered 2017-04-14: 1000 mL via INTRAVENOUS

## 2017-04-14 MED ORDER — VANCOMYCIN HCL IN DEXTROSE 750-5 MG/150ML-% IV SOLN
750.0000 mg | Freq: Two times a day (BID) | INTRAVENOUS | Status: DC
  Administered 2017-04-14 – 2017-04-15 (×4): 750 mg via INTRAVENOUS
  Filled 2017-04-14 (×5): qty 150

## 2017-04-14 MED ORDER — CARBAMAZEPINE 100 MG/5ML PO SUSP
200.0000 mg | Freq: Four times a day (QID) | ORAL | Status: DC
  Administered 2017-04-14 – 2017-04-16 (×6): 200 mg via ORAL
  Filled 2017-04-14 (×13): qty 10

## 2017-04-14 MED ORDER — DEXTROSE 50 % IV SOLN
0.5000 | Freq: Once | INTRAVENOUS | Status: AC
  Administered 2017-04-14: 25 mL via INTRAVENOUS
  Filled 2017-04-14: qty 50

## 2017-04-14 MED ORDER — DEXTROSE 5 % IV SOLN
2.0000 g | INTRAVENOUS | Status: DC
  Administered 2017-04-14 – 2017-04-17 (×3): 2 g via INTRAVENOUS
  Filled 2017-04-14 (×5): qty 2

## 2017-04-14 MED ORDER — DEXTROSE 50 % IV SOLN
25.0000 mL | Freq: Once | INTRAVENOUS | Status: AC
  Administered 2017-04-14: 25 mL via INTRAVENOUS

## 2017-04-14 NOTE — Consult Note (Signed)
[facility] for Infectious Disease    Date of Admission:  04/12/2017    Day 2 IV vancomycin        Day 2 cefepime        Day 2 PO vancomycin        Day 2 IV metronidazole       Reason for Consult: Automatic consultation for enterococcal bacteremia     Principal Problem:   Sepsis ([place]) Active Problems:   UTI (urinary tract infection)   Enterococcal bacteremia   Gram-negative bacteremia   Seizure (HCC)   ARF (acute renal failure) (HCC)   Normochromic normocytic anemia   Diarrhea   Dementia   Thrombocytopenia (HCC)   Moderate protein-calorie malnutrition ([place])   . betaxolol  1 drop Right Eye Daily  . carBAMazepine  200 mg Oral QID  . dorzolamide  1 drop Right Eye Q12H  . latanoprost  1 drop Right Eye QHS  . magnesium oxide  400 mg Oral Daily  . thyroid  90 mg Oral Daily    Recommendations: 1. Continue IV vancomycin 2. Change cefepime to ceftriaxone 3. Discontinue PO vancomycin and metronidazole 4. Repeat blood cultures 5. Goals of care discussion with family   Assessment: I suspect that has polymicrobial bacteremia is result of asymptomatic, Foley catheter associated urinary tract infection. He is penicillin allergic so I would continue IV vancomycin for his enterococcal bacteremia. Providencia is usually susceptible to ceftriaxone so I will narrow therapy pending antibiotic susceptibilities. His risk for endocarditis is low given that enterococcus is only in one blood culture, it is enterococcus faecalis and a probable source is known. I do not feel that we need to pursue echocardiography. I will obtain repeat blood cultures. He is colonized with C. difficile but is not having any diarrhea. I would stop empiric therapy at this time. I will be out of town tomorrow but I will have my partner, Dr. Lita Mains [phone], check antibiotic susceptibilities and make any antibiotic adjustments as needed. Please call him for any infectious disease questions this  weekend.    HPI: Erik Massey is a 83 y.o. male who was recently hospitalized with abdominal pain. 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 He was discharged to a skilled nursing facility. He developed increasing lethargy, hypotension and hematuria leading to readmission on 04/12/2017. He had low-grade fever with a temperature 100.1 at that time. Was felt to be septic. Urinalysis showed too numerous to count white blood cells and red blood cells and his urine culture grew multiple species. One of 2 blood cultures have grown Enterococcus faecalis and the other set grew Providencia.   Review of Systems: Review of Systems  Unable to perform ROS: Mental acuity    Past Medical History:  Diagnosis Date  . Alzheimer disease   . Seizures ([place])     Social History  Substance Use Topics  . Smoking status: Never Smoker  . Smokeless tobacco: Never Used  . Alcohol use No    Family History  Problem Relation Age of Onset  . Family history unknown: Yes   Allergies  Allergen Reactions  . Penicillins Swelling    Has patient had a PCN reaction causing immediate rash, facial/tongue/throat swelling, SOB or lightheadedness with hypotension: yes Has patient had a PCN reaction causing severe rash involving mucus  membranes or skin necrosis: no Has patient had a PCN reaction that required hospitalization- yes already in the hospital Has patient had a PCN reaction occurring within the last 10 years: No If all of the above answers are "NO", then may proceed with Cephalosporin use.     OBJECTIVE: Blood pressure 97/65, pulse 78, temperature 98.1 F (36.7 C), temperature source Oral, resp. rate (!) 22, height 5\' 9"  (1.753 m), weight 139 lb 12.4 oz (63.4 kg), SpO2 94 %.  Physical Exam  Constitutional:  He is frail and cachectic. He is confused.  HENT:  Mouth/Throat: No  oropharyngeal exudate.  Cardiovascular: Normal rate and regular rhythm.   No murmur heard. Pulmonary/Chest: Effort normal and breath sounds normal. He has no wheezes. He has no rales.  Abdominal: Soft. He exhibits no mass. There is no tenderness.  Genitourinary:  Genitourinary Comments: Foley catheter in place.  Musculoskeletal: He exhibits no edema or tenderness.  Neurological: He is alert.  Skin: No rash noted.    Lab Results Lab Results  Component Value Date   WBC 16.9 (H) 04/14/2017   HGB 8.9 (L) 04/14/2017   HCT 26.0 (L) 04/14/2017   MCV 89.3 04/14/2017   PLT 89 (L) 04/14/2017    Lab Results  Component Value Date   CREATININE 1.24 04/14/2017   BUN 26 (H) 04/14/2017   NA 141 04/14/2017   K 3.5 04/14/2017   CL 117 (H) 04/14/2017   CO2 20 (L) 04/14/2017    Lab Results  Component Value Date   ALT 72 (H) 04/13/2017   AST 115 (H) 04/13/2017   ALKPHOS 87 04/13/2017   BILITOT 0.5 04/13/2017     Microbiology: Recent Results (from the past 240 hour(s))  C difficile quick scan w PCR reflex     Status: Abnormal   Collection Time: 04/12/17  5:33 PM  Result Value Ref Range Status   C Diff antigen POSITIVE (A) NEGATIVE Final   C Diff toxin NEGATIVE NEGATIVE Final   C Diff interpretation Results are indeterminate. See PCR results.  Final  Clostridium Difficile by PCR     Status: Abnormal   Collection Time: 04/12/17  5:33 PM  Result Value Ref Range Status   Toxigenic C Difficile by pcr POSITIVE (A) NEGATIVE Final    Comment: Positive for toxigenic C. difficile with little to no toxin production. Only treat if clinical presentation suggests symptomatic illness.  Blood Culture (routine x 2)     Status: Abnormal (Preliminary result)   Collection Time: 04/12/17  6:50 PM  Result Value Ref Range Status   Specimen Description BLOOD LEFT HAND  Final   Special Requests IN PEDIATRIC BOTTLE Blood Culture adequate volume  Final   Culture  Setup Time   Final    GRAM NEGATIVE RODS IN  PEDIATRIC BOTTLE CRITICAL RESULT CALLED TO, READ BACK BY AND VERIFIED WITH: A. PHAM PHARMD, AT 1518 04/13/17 BY D. VANHOOK    Culture (A)  Final    PROVIDENCIA STUARTII SUSCEPTIBILITIES TO FOLLOW Performed at [facility], [place] [address] [place], [place] [address]    Report Status PENDING  Incomplete  Blood Culture ID Panel (Reflexed)     Status: None   Collection Time: 04/12/17  6:50 PM  Result Value Ref Range Status   Enterococcus species NOT DETECTED NOT DETECTED Final   Listeria monocytogenes NOT DETECTED NOT DETECTED Final   Staphylococcus species NOT DETECTED NOT DETECTED Final   Staphylococcus aureus NOT DETECTED NOT DETECTED Final  Streptococcus species NOT DETECTED NOT DETECTED Final   Streptococcus agalactiae NOT DETECTED NOT DETECTED Final   Streptococcus pneumoniae NOT DETECTED NOT DETECTED Final   Streptococcus pyogenes NOT DETECTED NOT DETECTED Final   Acinetobacter baumannii NOT DETECTED NOT DETECTED Final   Enterobacteriaceae species NOT DETECTED NOT DETECTED Final   Enterobacter cloacae complex NOT DETECTED NOT DETECTED Final   Escherichia coli NOT DETECTED NOT DETECTED Final   Klebsiella oxytoca NOT DETECTED NOT DETECTED Final   Klebsiella pneumoniae NOT DETECTED NOT DETECTED Final   Proteus species NOT DETECTED NOT DETECTED Final   Serratia marcescens NOT DETECTED NOT DETECTED Final   Haemophilus influenzae NOT DETECTED NOT DETECTED Final   Neisseria meningitidis NOT DETECTED NOT DETECTED Final   Pseudomonas aeruginosa NOT DETECTED NOT DETECTED Final   Candida albicans NOT DETECTED NOT DETECTED Final   Candida glabrata NOT DETECTED NOT DETECTED Final   Candida krusei NOT DETECTED NOT DETECTED Final   Candida parapsilosis NOT DETECTED NOT DETECTED Final   Candida tropicalis NOT DETECTED NOT DETECTED Final    Comment: Performed at [facility], [place] [address] [place], [place] [address]  Blood Culture (routine x 2)     Status: Abnormal (Preliminary  result)   Collection Time: 04/12/17 10:20 PM  Result Value Ref Range Status   Specimen Description BLOOD BLOOD RIGHT FOREARM  Final   Special Requests   Final    BOTTLES DRAWN AEROBIC AND ANAEROBIC Blood Culture adequate volume   Culture  Setup Time   Final    GRAM POSITIVE COCCI IN PAIRS IN BOTH AEROBIC AND ANAEROBIC BOTTLES CRITICAL RESULT CALLED TO, READ BACK BY AND VERIFIED WITH: D. ZIEGLER, RPHARMD ([facility]) AT 1800 ON 04/13/17 BY C. JESSUP, MLT.    Culture (A)  Final    ENTEROCOCCUS FAECALIS SUSCEPTIBILITIES TO FOLLOW Performed at [facility], [place] [address] [place], [place] [address]    Report Status PENDING  Incomplete  Blood Culture ID Panel (Reflexed)     Status: Abnormal   Collection Time: 04/12/17 10:20 PM  Result Value Ref Range Status   Enterococcus species DETECTED (A) NOT DETECTED Final    Comment: CRITICAL RESULT CALLED TO, READ BACK BY AND VERIFIED WITH: D. ZIEGLER, RPHARMD AT 1800 ON 04/13/17 BY C. JESSUP, MLT.    Vancomycin resistance NOT DETECTED NOT DETECTED Final   Listeria monocytogenes NOT DETECTED NOT DETECTED Final   Staphylococcus species NOT DETECTED NOT DETECTED Final   Staphylococcus aureus NOT DETECTED NOT DETECTED Final   Streptococcus species NOT DETECTED NOT DETECTED Final   Streptococcus agalactiae NOT DETECTED NOT DETECTED Final   Streptococcus pneumoniae NOT DETECTED NOT DETECTED Final   Streptococcus pyogenes NOT DETECTED NOT DETECTED Final   Acinetobacter baumannii NOT DETECTED NOT DETECTED Final   Enterobacteriaceae species NOT DETECTED NOT DETECTED Final   Enterobacter cloacae complex NOT DETECTED NOT DETECTED Final   Escherichia coli NOT DETECTED NOT DETECTED Final   Klebsiella oxytoca NOT DETECTED NOT DETECTED Final   Klebsiella pneumoniae NOT DETECTED NOT DETECTED Final   Proteus species NOT DETECTED NOT DETECTED Final   Serratia marcescens NOT DETECTED NOT DETECTED Final   Haemophilus influenzae NOT DETECTED NOT DETECTED Final    Neisseria meningitidis NOT DETECTED NOT DETECTED Final   Pseudomonas aeruginosa NOT DETECTED NOT DETECTED Final   Candida albicans NOT DETECTED NOT DETECTED Final   Candida glabrata NOT DETECTED NOT DETECTED Final   Candida krusei NOT DETECTED NOT DETECTED Final   Candida parapsilosis NOT DETECTED  NOT DETECTED Final   Candida tropicalis NOT DETECTED NOT DETECTED Final    Comment: Performed at [facility], [place] [address] [place], [place] [address]  MRSA PCR Screening     Status: None   Collection Time: 04/13/17 12:01 AM  Result Value Ref Range Status   MRSA by PCR NEGATIVE NEGATIVE Final    Comment:        The GeneXpert MRSA Assay (FDA approved for NASAL specimens only), is one component of a comprehensive MRSA colonization surveillance program. It is not intended to diagnose MRSA infection nor to guide or monitor treatment for MRSA infections.   Culture, Urine     Status: Abnormal   Collection Time: 04/13/17  1:37 AM  Result Value Ref Range Status   Specimen Description URINE, RANDOM  Final   Special Requests NONE  Final   Culture MULTIPLE SPECIES PRESENT, SUGGEST RECOLLECTION (A)  Final   Report Status 04/14/2017 FINAL  Final    Michel Bickers, MD [facility] [phone] pager   [phone] cell 04/14/2017, 4:34 PM

## 2017-04-14 NOTE — Progress Notes (Signed)
PULMONARY / CRITICAL CARE MEDICINE   Name: Erik Massey MRN: [id number] DOB: December 02, 1928    ADMISSION DATE:  04/12/2017 CONSULTATION DATE:  6/6  REFERRING MD:  [name]   CHIEF COMPLAINT:  Shock  HISTORY OF PRESENT ILLNESS:    83 year old man with dementia recent admission 5/17-5/21 for urinary retention due to BPH and cystitis, discharged with indwelling Foley to SNF for rehabilitation. He was readmitted 6/5 with altered mental status, hypotension and lactate of 6 with WBC count of 19.6. 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, conservative management per discussion with his granddaughter  SUBJECTIVE:   Denies pain, chest pain or dyspnea Remains on Neo-Synephrine drip Hypoglycemic overnight requiring repeated  dextrose pushes RN expresses some concern about swallowing and is nothing by mouth  VITAL SIGNS: BP (!) 101/55   Pulse 77   Temp 97.4 F (36.3 C) (Oral)   Resp (!) 22   Ht 5\' 9"  (1.753 m)   Wt 139 lb 12.4 oz (63.4 kg)   SpO2 97%   BMI 20.64 kg/m   HEMODYNAMICS:    VENTILATOR SETTINGS:    INTAKE / OUTPUT: I/O last 3 completed shifts: In: 4252.4 [I.V.:3752.4; IV [id number]:476] Out: 5465 [Urine:1390]  PHYSICAL EXAMINATION: General:  Frail elderly male in NAD Neuro:  Non-verbal.  Follows basic commands and answers yes/no questions  HEENT:  [place]/AT, PERRL, no JVD Cardiovascular:  RRR, no MRG Lungs:  Clear, diminished Abdomen:  Soft, non-distended, nontender  Musculoskeletal:  No acute deformity or ROM limitation. Skin:  Grossly intact  LABS:  BMET  Recent Labs Lab 04/12/17 1854 04/13/17 0524 04/14/17 0835  NA 144 143 141  K 3.7 3.7 3.5  CL 112* 115* 117*  CO2 20* 18* 20*  BUN 32* 30* 26*  CREATININE 1.68* 1.49* 1.24  GLUCOSE 79 92 78    Electrolytes  Recent Labs Lab 04/12/17 1854 04/13/17 0524 04/14/17 0835  CALCIUM 8.0* 7.2* 7.1*  MG  --   --  1.8  PHOS  --   --  1.8*    CBC  Recent  Labs Lab 04/13/17 0306 04/13/17 0524 04/14/17 0835  WBC 13.3* 19.7* 16.9*  HGB 9.6* 9.8* 8.9*  HCT 28.4* 29.5* 26.0*  PLT 88* 110* 89*    Coag's  Recent Labs Lab 04/13/17 0109  APTT 31  INR 1.26    Sepsis Markers  Recent Labs Lab 04/12/17 2201 04/13/17 0109 04/13/17 0524  LATICACIDVEN 5.15* 4.6* 3.7*  PROCALCITON  --  106.03  --     ABG No results for input(s): PHART, PCO2ART, PO2ART in the last 168 hours.  Liver Enzymes  Recent Labs Lab 04/12/17 1854 04/13/17 0524  AST 90* 115*  ALT 57 72*  ALKPHOS 103 87  BILITOT 0.6 0.5  ALBUMIN 2.8* 2.4*    Cardiac Enzymes No results for input(s): TROPONINI, PROBNP in the last 168 hours.  Glucose  Recent Labs Lab 04/14/17 0004 04/14/17 0042 04/14/17 0402 04/14/17 0513 04/14/17 0831 04/14/17 0924  GLUCAP 65 98 61* 97 64* 109*    Imaging No results found.   STUDIES:    CULTURES: Blood cultures 6/5 > enterococcus , GNR C-dif 6/5 > antigen positive, toxin neg  ANTIBIOTICS: Cefepime  6/5 > Levaquin 6/5 Vancomycin 6/5 >  SIGNIFICANT EVENTS: 5/17 > 5/21 admit for urinary retention 6/5 > admit for sepsis  LINES/TUBES:   DISCUSSION: 83 year old male with dementia and recent admit for urinary retention  presenting with septic shock-enterococcus bacteremia,  likely source being UTI vs c diff -prior CT scan had shown proctocolitis Another gram-negative rod noted in blood culture but not identified yet  ASSESSMENT / PLAN:    CARDIOVASCULAR A:  septic shock with component of hypovolemia.  P:  Telemetry  Phenylephrine as needed for MAP goal > 65 DNR   RENAL A:   AKI -improving   hypophosphatemia  P:   Repeat BMP s/p volume  replete electrolytes as needed  GASTROINTESTINAL A:   Mild protein calorie malnutrition  dysphagia  P:   NPO until swallowing evaluation  Protonix for SUP  HEMATOLOGIC A:   Anemia of chronic disease  (hgb close to baseline)  P:  Transfuse only for  hemoglobin less than 7   INFECTIOUS A:   Sepsis: likely secondary to recurrent UTI. Questionable opacification on CXR, but this seems chronic.   P:   Ct Cefepime & resume  vanco to cover enterococcus until sensitivities obtain   ENDOCRINE A:   Hypoglycemia P:   D10 drip  NEUROLOGIC A:   Acute metabolic encephalopathy Alzheimer's dementia  P:   RASS goal: 0    FAMILY  - Updates: Daughter  [name] updated  - Inter-disciplinary family meet or Palliative Care meeting due by:  6/12   The patient is critically ill with multiple organ systems failure and requires high complexity decision making for assessment and support, frequent evaluation and titration of therapies, application of advanced monitoring technologies and extensive interpretation of multiple databases. Critical Care Time devoted to patient care services described in this note independent of APP time is 31 minutes.    Kara Mead MD. [place] Flood. [facility] Pager [phone] If no response call [phone]    04/14/2017 9:56 AM

## 2017-04-14 NOTE — Evaluation (Addendum)
Clinical/Bedside Swallow Evaluation Patient Details  Name: Erik Massey MRN: [id number] Date of Birth: 03/07/1929  Today's Date: 04/14/2017 Time: SLP Start Time (ACUTE ONLY): 1215 SLP Stop Time (ACUTE ONLY): 1250 SLP Time Calculation (min) (ACUTE ONLY): 35 min  Past Medical History:  Past Medical History:  Diagnosis Date  . Alzheimer disease   . Seizures ([place])    Past Surgical History:  Past Surgical History:  Procedure Laterality Date  . KNEE SURGERY     HPI:  83 year old male admitted with confusion, hypotension, hematuria. Increasing lethargy and weakness recently. PMH: recent hospitalization for abdominal pain/LUQ mass, dementia, hypothyroid, seizures, HOH. BSE ordered due to difficulty with pos per RN.   Assessment / Plan / Recommendation Clinical Impression  Pt presents with marked decline in swallow function and safety, as compared to previous BSE (03/25/17). Oral care was completed with suction, which pt tolerated well. Speech was largely unintelligible, indicating oral motor weakness. Voice quality was clear, but intermittent gravelly vs wet voice quality was noted. Pt able to clear throat on command. Trials of puree and nectar thick liquid appeared to be tolerated initially, however, delayed cough and drop in O2 sats (to mid 70's) was noted.   MD was paged for additional information and direction regarding aggressiveness of pt care. Objective study would be beneificial to identify presence/absence of aspiration, and determine least restrictive diet, however, pt risk of aspiration is suspected to be high based on clinical presentation.   Recommend Palliative Care consult to facilitate establishment of goals of care, including appropriateness of MBS, conservative diets, and consideration of feeding tube placement if necessary.  In the interim, strict NPO. If pt has critical meds which MUST be provided po, recommend providing them crushed in puree. Awaiting direction from MD. RN  informed of results and recommendations. ST will await MD direction.   SLP Visit Diagnosis: Dysphagia, oropharyngeal phase (R13.12)    Aspiration Risk  Severe aspiration risk    Diet Recommendation NPO   Medication Administration: Via alternative means    Other  Recommendations Oral Care Recommendations: Oral care QID Other Recommendations: Have oral suction available   Follow up Recommendations  (TBD)      Frequency and Duration min 1 x/week  2 weeks       Prognosis Prognosis for Safe Diet Advancement: Guarded Barriers to Reach Goals: Cognitive deficits      Swallow Study   General Date of Onset: 04/12/17 HPI: 83 year old male admitted with confusion, hypotension, hematuria. Increasing lethargy and weakness recently. PMH: recent hospitalization for abdominal pain/LUQ mass, dementia, hypothyroid, seizures, HOH. BSE ordered due to difficulty with pos per RN. Type of Study: Bedside Swallow Evaluation Previous Swallow Assessment: BSE 03/25/17 - no overt s/s aspiration. Reg/thin recommended Diet Prior to this Study: NPO Temperature Spikes Noted: No Respiratory Status: Room air History of Recent Intubation: No Behavior/Cognition: Alert;Cooperative [facility]) Oral Cavity Assessment: Within Functional Limits Oral Care Completed by SLP: Yes Oral Cavity - Dentition: Adequate natural dentition;Missing dentition Self-Feeding Abilities: Total assist Patient Positioning: Upright in bed Baseline Vocal Quality: Normal (intermittently gravelly vs wet) Volitional Cough: Weak Volitional Swallow: Able to elicit    Oral/Motor/Sensory Function Overall Oral Motor/Sensory Function: Moderate impairment   Ice Chips Ice chips: Impaired Presentation: Spoon Pharyngeal Phase Impairments: Throat Clearing - Delayed;Suspected delayed Swallow   Thin Liquid Thin Liquid: Not tested    Nectar Thick Nectar Thick Liquid: Impaired Presentation: Spoon Pharyngeal Phase Impairments: Suspected delayed  Swallow;Cough - Delayed   Honey  Thick Honey Thick Liquid: Not tested   Puree Puree: Impaired Presentation: Spoon Pharyngeal Phase Impairments: Suspected delayed Swallow;Cough - Delayed   Solid   GO   Solid: Not tested       Tawnee Clegg B. [place], [place], [place]  [name] 04/14/2017,1:08 PM

## 2017-04-14 NOTE — Progress Notes (Signed)
Patient was hypoglycemic with BS 65 @ midnight. Half and amp of 50% dextrose was given IV. Recheck 15 minutes later was 98.

## 2017-04-14 NOTE — Progress Notes (Signed)
Pharmacy Antibiotic Note  Erik Massey is a 83 y.o. male admitted on 04/12/2017 with sepsis.  Pharmacy has been consulted for vancomycin/cefepime/levofloxacin dosing.  Pt with hypotension during transfer to ED from nursing home.  Recent admission for abdominal pain. Recent discharge on 5/21. Pt on ciprofloxacin and metronidazole PTA.   6/6 PM: BCID identifying GNR (unable to identify organism) - continue cefepime and now enterococcus (no VAN A/B gene detected) but patient with PCN allergy  6/7: SCr improved to 1.24, CrCl 43 ml/min/1.70m2 (normalized)  Plan:  Increase vancomycin to 500 mg IV q12h   Continue cefepime to 2 gm IV q24h   Monitor clinical course, renal function, cultures as available   Height: 5\' 9"  (175.3 cm) Weight: 139 lb 12.4 oz (63.4 kg) IBW/kg (Calculated) : 70.7  Temp (24hrs), Avg:98.1 F (36.7 C), Min:97.4 F (36.3 C), Max:99.2 F (37.3 C)   Recent Labs Lab 04/12/17 1854 04/12/17 1908 04/12/17 2201 04/13/17 0109 04/13/17 0306 04/13/17 0524 04/14/17 0835  WBC 19.6*  --   --   --  13.3* 19.7* 16.9*  CREATININE 1.68*  --   --   --   --  1.49* 1.24  LATICACIDVEN  --  6.17* 5.15* 4.6*  --  3.7*  --     Estimated Creatinine Clearance: 39.1 mL/min (by C-G formula based on SCr of 1.24 mg/dL).    Allergies  Allergen Reactions  . Penicillins Swelling    Has patient had a PCN reaction causing immediate rash, facial/tongue/throat swelling, SOB or lightheadedness with hypotension: yes Has patient had a PCN reaction causing severe rash involving mucus membranes or skin necrosis: no Has patient had a PCN reaction that required hospitalization- yes already in the hospital Has patient had a PCN reaction occurring within the last 10 years: No If all of the above answers are "NO", then may proceed with Cephalosporin use.     Antimicrobials this admission:  6/5 cefepime>> 6/5 vancomycin>> 6/5 levofloxacin >> 6/6  Dose adjustments this admission:   6/6 increase vancomycin from 500mg  IV q24h to 750mg  q24h for improved SCr  Microbiology results:  6/5BCx: sent 6/5 C.Diff Ag/PCR: POSITIVE, Toxin: NEGATIVE (no stools per RN) 6/6 UCx: sent   Thank you for allowing pharmacy to be a part of this patient's care.  Peggyann Juba, PharmD, BCPS Pager: [phone] 04/14/2017 11:13 AM

## 2017-04-15 DIAGNOSIS — R7881 Bacteremia: Secondary | ICD-10-CM

## 2017-04-15 DIAGNOSIS — Y732 Prosthetic and other implants, materials and accessory gastroenterology and urology devices associated with adverse incidents: Secondary | ICD-10-CM

## 2017-04-15 DIAGNOSIS — L723 Sebaceous cyst: Secondary | ICD-10-CM

## 2017-04-15 DIAGNOSIS — B952 Enterococcus as the cause of diseases classified elsewhere: Secondary | ICD-10-CM

## 2017-04-15 DIAGNOSIS — Z8619 Personal history of other infectious and parasitic diseases: Secondary | ICD-10-CM

## 2017-04-15 DIAGNOSIS — T83518A Infection and inflammatory reaction due to other urinary catheter, initial encounter: Secondary | ICD-10-CM

## 2017-04-15 LAB — GLUCOSE, CAPILLARY
GLUCOSE-CAPILLARY: 108 mg/dL — AB (ref 65–99)
GLUCOSE-CAPILLARY: 103 mg/dL — AB (ref 65–99)
GLUCOSE-CAPILLARY: 112 mg/dL — AB (ref 65–99)
Glucose-Capillary: 96 mg/dL (ref 65–99)
Glucose-Capillary: 108 mg/dL — ABNORMAL HIGH (ref 65–99)

## 2017-04-15 LAB — BASIC METABOLIC PANEL
ANION GAP: 6 (ref 5–15)
BUN: 22 mg/dL — AB (ref 6–20)
CALCIUM: 7.1 mg/dL — AB (ref 8.9–10.3)
CO2: 18 mmol/L — ABNORMAL LOW (ref 22–32)
Chloride: 113 mmol/L — ABNORMAL HIGH (ref 101–111)
Creatinine, Ser: 1.06 mg/dL (ref 0.61–1.24)
GFR calc Af Amer: >60 mL/min (ref >60)
GFR calc non Af Amer: >60 mL/min (ref >60)
Glucose, Bld: 103 mg/dL — ABNORMAL HIGH (ref 65–99)
POTASSIUM: 3.6 mmol/L (ref 3.5–5.1)
SODIUM: 137 mmol/L (ref 135–145)

## 2017-04-15 LAB — CBC
HCT: 28 % — ABNORMAL LOW (ref 39.0–52.0)
Hemoglobin: 9.5 g/dL — ABNORMAL LOW (ref 13.0–17.0)
MCH: 30.1 pg (ref 26.0–34.0)
MCHC: 33.9 g/dL (ref 30.0–36.0)
MCV: 88.6 fL (ref 78.0–100.0)
Platelets: 91 10*3/uL — ABNORMAL LOW (ref 150–400)
RBC: 3.16 MIL/uL — AB (ref 4.22–5.81)
RDW: 15 % (ref 11.5–15.5)
WBC: 14.4 10*3/uL — AB (ref 4.0–10.5)

## 2017-04-15 LAB — CULTURE, BLOOD (ROUTINE X 2)
SPECIAL REQUESTS: ADEQUATE
SPECIAL REQUESTS: ADEQUATE

## 2017-04-15 LAB — VANCOMYCIN, TROUGH: VANCOMYCIN TR: 19 ug/mL (ref 15–20)

## 2017-04-15 LAB — MAGNESIUM: MAGNESIUM: 1.7 mg/dL (ref 1.7–2.4)

## 2017-04-15 LAB — PHOSPHORUS: Phosphorus: 1.9 mg/dL — ABNORMAL LOW (ref 2.5–4.6)

## 2017-04-15 MED ORDER — POTASSIUM PHOSPHATES 15 MMOLE/5ML IV SOLN
30.0000 mmol | Freq: Once | INTRAVENOUS | Status: AC
  Administered 2017-04-15: 30 mmol via INTRAVENOUS
  Filled 2017-04-15: qty 10

## 2017-04-15 MED ORDER — RESOURCE THICKENUP CLEAR PO POWD
ORAL | Status: DC | PRN
  Filled 2017-04-15: qty 125

## 2017-04-15 NOTE — Plan of Care (Signed)
Problem: Urinary Elimination: Goal: Signs and symptoms of infection will decrease Outcome: Progressing Pt's urine is more WDL this morning, amber/yellow straw in color

## 2017-04-15 NOTE — Progress Notes (Signed)
[facility] Progress Note Patient Name: Erik Massey DOB: [birth year] MRN: [id number]   Date of Service  04/15/2017  HPI/Events of Note  hypophos and potassium of 3.6  eICU Interventions  Phos and potassium replaced     Intervention Category Intermediate Interventions: Electrolyte abnormality - evaluation and management  Fay Bagg 04/15/2017, 4:43 AM

## 2017-04-15 NOTE — Progress Notes (Addendum)
PULMONARY / CRITICAL CARE MEDICINE   Name: Erik Massey MRN: [id number] DOB: [birth year]    ADMISSION DATE:  04/12/2017 CONSULTATION DATE:  6/6  REFERRING MD:  [name]   CHIEF COMPLAINT:  Shock  HISTORY OF PRESENT ILLNESS:    83 year old man with dementia recent admission 5/17-5/21 for urinary retention due to BPH and cystitis, discharged with indwelling Foley to SNF for rehabilitation. He was readmitted 6/5 with altered mental status, hypotension and lactate of 6 with WBC count of 19.6. 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, conservative management per discussion with his granddaughter  SUBJECTIVE:  Off Neo-Synephrine drip Required mittens  CBGs improved   VITAL SIGNS: BP 99/60   Pulse 80   Temp 98.4 F (36.9 C) (Oral)   Resp 20   Ht 5\' 9"  (1.753 m)   Wt 139 lb 12.4 oz (63.4 kg)   SpO2 100%   BMI 20.64 kg/m   HEMODYNAMICS:    VENTILATOR SETTINGS:    INTAKE / OUTPUT: I/O last 3 completed shifts: In: 7139.7 [I.V.:4273.1; IV Piggyback:2866.7] Out: 7672 [Urine:1415]  PHYSICAL EXAMINATION: General:  Frail elderly male in NAD,  restless in bed, mitten left hand Neuro: awake,Follows basic commands and answers yes/no questions  HEENT:  [place]/AT, PERRL, no JVD Cardiovascular:  RRR, no MRG Lungs:  Clear, diminished Abdomen:  Soft, non-distended, nontender  Musculoskeletal:  No acute deformity or ROM limitation. Skin:  Grossly intact  LABS:  BMET  Recent Labs Lab 04/13/17 0524 04/14/17 0835 04/15/17 0331  NA 143 141 137  K 3.7 3.5 3.6  CL 115* 117* 113*  CO2 18* 20* 18*  BUN 30* 26* 22*  CREATININE 1.49* 1.24 1.06  GLUCOSE 92 78 103*    Electrolytes  Recent Labs Lab 04/13/17 0524 04/14/17 0835 04/15/17 0331  CALCIUM 7.2* 7.1* 7.1*  MG  --  1.8 1.7  PHOS  --  1.8* 1.9*    CBC  Recent Labs Lab 04/13/17 0524 04/14/17 0835 04/15/17 0331  WBC 19.7* 16.9* 14.4*  HGB 9.8* 8.9* 9.5*  HCT 29.5* 26.0*  28.0*  PLT 110* 89* 91*    Coag's  Recent Labs Lab 04/13/17 0109  APTT 31  INR 1.26    Sepsis Markers  Recent Labs Lab 04/12/17 2201 04/13/17 0109 04/13/17 0524  LATICACIDVEN 5.15* 4.6* 3.7*  PROCALCITON  --  106.03  --     ABG No results for input(s): PHART, PCO2ART, PO2ART in the last 168 hours.  Liver Enzymes  Recent Labs Lab 04/12/17 1854 04/13/17 0524  AST 90* 115*  ALT 57 72*  ALKPHOS 103 87  BILITOT 0.6 0.5  ALBUMIN 2.8* 2.4*    Cardiac Enzymes No results for input(s): TROPONINI, PROBNP in the last 168 hours.  Glucose  Recent Labs Lab 04/14/17 1215 04/14/17 1634 04/14/17 1940 04/14/17 2337 04/15/17 0310 04/15/17 0728  GLUCAP 94 110* 128* 102* 96 108*    Imaging No results found.   STUDIES:    CULTURES: Blood cultures 6/5 > enterococcus , GNR C-dif 6/5 > antigen positive, toxin neg  ANTIBIOTICS: Cefepime  6/5 >6/7 ceftx 6/7 >> Levaquin 6/5>> 6/5 Vancomycin 6/5 >  SIGNIFICANT EVENTS: 5/17 > 5/21 admit for urinary retention 6/5 > admit for sepsis  LINES/TUBES:   DISCUSSION: 83 year old male with dementia and recent admit for urinary retention  presenting with septic shock-enterococcal + GNR bacteremia, likely source being UTI  c diff  Likely colonisation-prior CT scan had shown proctocolitis  ASSESSMENT / PLAN:    CARDIOVASCULAR A:  Septic shock - resolving  P:  Telemetry  Phenylephrine tapered to off DNR   RENAL A:   AKI -resolved  hypophosphatemia BPH - urinary retention with indwelling foley  P:   Repeat BMP s/p volume  replete phos  WOuld not remove foley without urology input  GASTROINTESTINAL A:   Mild protein calorie malnutrition Dysphagia worse compared to 2 wks ago - due to acute illness  P:   NPO  - rpt swallowing in 1-2 days ,expect to improve back to baseline , hope to avoid NG here Protonix for SUP  HEMATOLOGIC A:   Anemia of chronic disease  (hgb close to baseline)  P:   Transfuse only for hemoglobin less than 7   INFECTIOUS A:   Sepsis: secondary to recurrent UTI with indwelling foley.  LLL opacity on CXR -favor atx C diff colonisation  P:   ceftraixone + vanc per ID Stopped c diff therapy but ct enteric precautions  ENDOCRINE A:   Hypoglycemia P:   D10 drip while npo  NEUROLOGIC A:   Acute metabolic encephalopathy Alzheimer's dementia  P:   RASS goal: 0 Haldol prn    FAMILY  - Updates: Daughter  [name] updated   Transfer to tele & to [facility] 6/9   Kara Mead MD. [facility]. [place] [facility] Pager [phone] If no response call [phone]    04/15/2017 10:08 AM

## 2017-04-15 NOTE — Progress Notes (Signed)
INFECTIOUS DISEASE PROGRESS NOTE  ID: Erik Massey is a 83 y.o. male with  Principal Problem:   Sepsis ([place]) Active Problems:   Seizure ([place])   UTI (urinary tract infection)   ARF (acute renal failure) (H[facility])   Normochromic normocytic anemia   Diarrhea   Dementia   Thrombocytopenia ([facility])   Enterococcal bacteremia   Gram-negative bacteremia   Moderate protein-calorie malnutrition ([facility])  Subjective: Out of icu No Bm since 6-5 No complaints.    Abtx:  Anti-infectives    Start     Dose/Rate Route Frequency Ordered Stop   04/14/17 2200  levofloxacin (LEVAQUIN) IVPB 750 mg  Status:  Discontinued     750 mg 100 mL/hr over 90 Minutes Intravenous Every 48 hours 04/12/17 2002 04/13/17 0242   04/14/17 2200  cefTRIAXone (ROCEPHIN) 2 g in dextrose 5 % 50 mL IVPB     2 g 100 mL/hr over 30 Minutes Intravenous Every 24 hours 04/14/17 1621     04/14/17 1200  vancomycin (VANCOCIN) IVPB 750 mg/150 ml premix     750 mg 150 mL/hr over 60 Minutes Intravenous Every 12 hours 04/14/17 1120     04/13/17 2200  vancomycin (VANCOCIN) IVPB 750 mg/150 ml premix  Status:  Discontinued     750 mg 150 mL/hr over 60 Minutes Intravenous Every 24 hours 04/13/17 0930 04/13/17 1047   04/13/17 2200  vancomycin (VANCOCIN) IVPB 750 mg/150 ml premix  Status:  Discontinued     750 mg 150 mL/hr over 60 Minutes Intravenous Every 24 hours 04/13/17 1852 04/14/17 1120   04/13/17 2000  ceFEPIme (MAXIPIME) 1 g in dextrose 5 % 50 mL IVPB  Status:  Discontinued     1 g 100 mL/hr over 30 Minutes Intravenous Every 24 hours 04/12/17 2002 04/13/17 1827   04/13/17 2000  vancomycin (VANCOCIN) 500 mg in sodium chloride 0.9 % 100 mL IVPB  Status:  Discontinued     500 mg 100 mL/hr over 60 Minutes Intravenous Every 24 hours 04/12/17 2002 04/13/17 0930   04/13/17 2000  ceFEPIme (MAXIPIME) 2 g in dextrose 5 % 50 mL IVPB  Status:  Discontinued     2 g 100 mL/hr over 30 Minutes Intravenous Every 24 hours 04/13/17 1827  04/14/17 1621   04/13/17 1200  vancomycin (VANCOCIN) 50 mg/mL oral solution 125 mg  Status:  Discontinued     125 mg Oral 4 times daily 04/13/17 1009 04/14/17 1621   04/13/17 1200  metroNIDAZOLE (FLAGYL) IVPB 500 mg  Status:  Discontinued     500 mg 100 mL/hr over 60 Minutes Intravenous Every 8 hours 04/13/17 1009 04/14/17 1621   04/12/17 1930  ceFEPIme (MAXIPIME) 2 g in dextrose 5 % 50 mL IVPB     2 g 100 mL/hr over 30 Minutes Intravenous  Once 04/12/17 1929 04/12/17 2123   04/12/17 1915  levofloxacin (LEVAQUIN) IVPB 750 mg  Status:  Discontinued     750 mg 100 mL/hr over 90 Minutes Intravenous  Once 04/12/17 1913 04/13/17 0242   04/12/17 1915  aztreonam (AZACTAM) 2 g in dextrose 5 % 50 mL IVPB  Status:  Discontinued     2 g 100 mL/hr over 30 Minutes Intravenous  Once 04/12/17 1913 04/12/17 1928   04/12/17 1915  vancomycin (VANCOCIN) IVPB 1000 mg/200 mL premix     1,000 mg 200 mL/hr over 60 Minutes Intravenous  Once 04/12/17 1913 04/13/17 0121      Medications:  Scheduled: . betaxolol  1 drop  Right Eye Daily  . carBAMazepine  200 mg Oral QID  . dorzolamide  1 drop Right Eye Q12H  . latanoprost  1 drop Right Eye QHS  . magnesium oxide  400 mg Oral Daily  . thyroid  90 mg Oral Daily    Objective: Vital signs in last 24 hours: Temp:  [97.3 F (36.3 C)-98.4 F (36.9 C)] 97.7 F (36.5 C) (06/08 1643) Pulse Rate:  [67-104] 67 (06/08 1643) Resp:  [18-36] 24 (06/08 1643) BP: (78-124)/(32-107) 112/72 (06/08 1643) SpO2:  [88 %-100 %] 96 % (06/08 1643)   General appearance: alert, cooperative and no distress Resp: clear to auscultation bilaterally Cardio: regular rate and rhythm GI: normal findings: bowel sounds normal and soft, non-tender Extremities: edema pedal Skin: large sebaceous cyst L groin. small os. no d/c expressed.   Lab Results  Recent Labs  04/14/17 0835 04/15/17 0331  WBC 16.9* 14.4*  HGB 8.9* 9.5*  HCT 26.0* 28.0*  NA 141 137  K 3.5 3.6  CL 117* 113*   CO2 20* 18*  BUN 26* 22*  CREATININE 1.24 1.06   Liver Panel  Recent Labs  04/12/17 1854 04/13/17 0524  PROT 5.5* 5.0*  ALBUMIN 2.8* 2.4*  AST 90* 115*  ALT 57 72*  ALKPHOS 103 87  BILITOT 0.6 0.5   Sedimentation Rate No results for input(s): ESRSEDRATE in the last 72 hours. C-Reactive Protein No results for input(s): CRP in the last 72 hours.  Microbiology: Recent Results (from the past 240 hour(s))  C difficile quick scan w PCR reflex     Status: Abnormal   Collection Time: 04/12/17  5:33 PM  Result Value Ref Range Status   C Diff antigen POSITIVE (A) NEGATIVE Final   C Diff toxin NEGATIVE NEGATIVE Final   C Diff interpretation Results are indeterminate. See PCR results.  Final  Clostridium Difficile by PCR     Status: Abnormal   Collection Time: 04/12/17  5:33 PM  Result Value Ref Range Status   Toxigenic C Difficile by pcr POSITIVE (A) NEGATIVE Final    Comment: Positive for toxigenic C. difficile with little to no toxin production. Only treat if clinical presentation suggests symptomatic illness.  Blood Culture (routine x 2)     Status: Abnormal   Collection Time: 04/12/17  6:50 PM  Result Value Ref Range Status   Specimen Description BLOOD LEFT HAND  Final   Special Requests IN PEDIATRIC BOTTLE Blood Culture adequate volume  Final   Culture  Setup Time   Final    GRAM NEGATIVE RODS IN PEDIATRIC BOTTLE CRITICAL RESULT CALLED TO, READ BACK BY AND VERIFIED WITH: A. PHAM PHARMD, AT 1518 04/13/17 BY Rush Landmark Performed at [facility], [place] [address], [place], [place] [address]    Culture PROVIDENCIA STUARTII (A)  Final   Report Status 04/15/2017 FINAL  Final   Organism ID, Bacteria PROVIDENCIA STUARTII  Final      Susceptibility   Providencia stuartii - MIC*    AMPICILLIN >=32 RESISTANT Resistant     CEFAZOLIN >=64 RESISTANT Resistant     CEFEPIME <=1 SENSITIVE Sensitive     CEFTAZIDIME <=1 SENSITIVE Sensitive     CEFTRIAXONE <=1 SENSITIVE Sensitive      CIPROFLOXACIN >=4 RESISTANT Resistant     GENTAMICIN RESISTANT Resistant     IMIPENEM 1 SENSITIVE Sensitive     TRIMETH/SULFA >=320 RESISTANT Resistant     AMPICILLIN/SULBACTAM >=32 RESISTANT Resistant     PIP/TAZO <=4 SENSITIVE Sensitive     *  PROVIDENCIA STUARTII  Blood Culture ID Panel (Reflexed)     Status: None   Collection Time: 04/12/17  6:50 PM  Result Value Ref Range Status   Enterococcus species NOT DETECTED NOT DETECTED Final   Listeria monocytogenes NOT DETECTED NOT DETECTED Final   Staphylococcus species NOT DETECTED NOT DETECTED Final   Staphylococcus aureus NOT DETECTED NOT DETECTED Final   Streptococcus species NOT DETECTED NOT DETECTED Final   Streptococcus agalactiae NOT DETECTED NOT DETECTED Final   Streptococcus pneumoniae NOT DETECTED NOT DETECTED Final   Streptococcus pyogenes NOT DETECTED NOT DETECTED Final   Acinetobacter baumannii NOT DETECTED NOT DETECTED Final   Enterobacteriaceae species NOT DETECTED NOT DETECTED Final   Enterobacter cloacae complex NOT DETECTED NOT DETECTED Final   Escherichia coli NOT DETECTED NOT DETECTED Final   Klebsiella oxytoca NOT DETECTED NOT DETECTED Final   Klebsiella pneumoniae NOT DETECTED NOT DETECTED Final   Proteus species NOT DETECTED NOT DETECTED Final   Serratia marcescens NOT DETECTED NOT DETECTED Final   Haemophilus influenzae NOT DETECTED NOT DETECTED Final   Neisseria meningitidis NOT DETECTED NOT DETECTED Final   Pseudomonas aeruginosa NOT DETECTED NOT DETECTED Final   Candida albicans NOT DETECTED NOT DETECTED Final   Candida glabrata NOT DETECTED NOT DETECTED Final   Candida krusei NOT DETECTED NOT DETECTED Final   Candida parapsilosis NOT DETECTED NOT DETECTED Final   Candida tropicalis NOT DETECTED NOT DETECTED Final    Comment: Performed at [facility], [address], [place], [place] [address]  Blood Culture (routine x 2)     Status: Abnormal   Collection Time: 04/12/17 10:20 PM  Result Value Ref  Range Status   Specimen Description BLOOD BLOOD RIGHT FOREARM  Final   Special Requests   Final    BOTTLES DRAWN AEROBIC AND ANAEROBIC Blood Culture adequate volume   Culture  Setup Time   Final    GRAM POSITIVE COCCI IN PAIRS IN BOTH AEROBIC AND ANAEROBIC BOTTLES CRITICAL RESULT CALLED TO, READ BACK BY AND VERIFIED WITH: D. ZIEGLER, RPHARMD ([facility]) AT 1800 ON 04/13/17 BY C. JESSUP, MLT. Performed at [facility], [place] [address], [place], [place] [address]    Culture ENTEROCOCCUS FAECALIS (A)  Final   Report Status 04/15/2017 FINAL  Final   Organism ID, Bacteria ENTEROCOCCUS FAECALIS  Final      Susceptibility   Enterococcus faecalis - MIC*    AMPICILLIN <=2 SENSITIVE Sensitive     VANCOMYCIN 4 SENSITIVE Sensitive     GENTAMICIN SYNERGY RESISTANT Resistant     * ENTEROCOCCUS FAECALIS  Blood Culture ID Panel (Reflexed)     Status: Abnormal   Collection Time: 04/12/17 10:20 PM  Result Value Ref Range Status   Enterococcus species DETECTED (A) NOT DETECTED Final    Comment: CRITICAL RESULT CALLED TO, READ BACK BY AND VERIFIED WITH: D. ZIEGLER, RPHARMD AT 1800 ON 04/13/17 BY C. JESSUP, MLT.    Vancomycin resistance NOT DETECTED NOT DETECTED Final   Listeria monocytogenes NOT DETECTED NOT DETECTED Final   Staphylococcus species NOT DETECTED NOT DETECTED Final   Staphylococcus aureus NOT DETECTED NOT DETECTED Final   Streptococcus species NOT DETECTED NOT DETECTED Final   Streptococcus agalactiae NOT DETECTED NOT DETECTED Final   Streptococcus pneumoniae NOT DETECTED NOT DETECTED Final   Streptococcus pyogenes NOT DETECTED NOT DETECTED Final   Acinetobacter baumannii NOT DETECTED NOT DETECTED Final   Enterobacteriaceae species NOT DETECTED NOT DETECTED Final   Enterobacter cloacae complex NOT DETECTED  NOT DETECTED Final   Escherichia coli NOT DETECTED NOT DETECTED Final   Klebsiella oxytoca NOT DETECTED NOT DETECTED Final   Klebsiella pneumoniae NOT DETECTED NOT DETECTED Final    Proteus species NOT DETECTED NOT DETECTED Final   Serratia marcescens NOT DETECTED NOT DETECTED Final   Haemophilus influenzae NOT DETECTED NOT DETECTED Final   Neisseria meningitidis NOT DETECTED NOT DETECTED Final   Pseudomonas aeruginosa NOT DETECTED NOT DETECTED Final   Candida albicans NOT DETECTED NOT DETECTED Final   Candida glabrata NOT DETECTED NOT DETECTED Final   Candida krusei NOT DETECTED NOT DETECTED Final   Candida parapsilosis NOT DETECTED NOT DETECTED Final   Candida tropicalis NOT DETECTED NOT DETECTED Final    Comment: Performed at [facility], [place] [address] [place], [place] [address]  MRSA PCR Screening     Status: None   Collection Time: 04/13/17 12:01 AM  Result Value Ref Range Status   MRSA by PCR NEGATIVE NEGATIVE Final    Comment:        The GeneXpert MRSA Assay (FDA approved for NASAL specimens only), is one component of a comprehensive MRSA colonization surveillance program. It is not intended to diagnose MRSA infection nor to guide or monitor treatment for MRSA infections.   Culture, Urine     Status: Abnormal   Collection Time: 04/13/17  1:37 AM  Result Value Ref Range Status   Specimen Description URINE, RANDOM  Final   Special Requests NONE  Final   Culture MULTIPLE SPECIES PRESENT, SUGGEST RECOLLECTION (A)  Final   Report Status 04/14/2017 FINAL  Final  Culture, blood (routine x 2)     Status: None (Preliminary result)   Collection Time: 04/14/17  4:52 PM  Result Value Ref Range Status   Specimen Description BLOOD RIGHT HAND  Final   Special Requests   Final    BOTTLES DRAWN AEROBIC AND ANAEROBIC Blood Culture adequate volume   Culture   Final    NO GROWTH < 24 HOURS Performed at [facility], [place] [address] [place], [place] [address]    Report Status PENDING  Incomplete  Culture, blood (routine x 2)     Status: None (Preliminary result)   Collection Time: 04/14/17  4:52 PM  Result Value Ref Range Status   Specimen  Description BLOOD RIGHT HAND  Final   Special Requests   Final    BOTTLES DRAWN AEROBIC AND ANAEROBIC Blood Culture adequate volume   Culture   Final    NO GROWTH < 24 HOURS Performed at [facility], [place] [address] [place], [place] [address]    Report Status PENDING  Incomplete    Studies/Results: No results found.   Assessment/Plan: Enterococcal bacteremia Serratia bacteremia UTI from foley, pyuria Hx of C diff  Total days of antibiotics: 3 vanco/ceftriaxone  WBC better, afebrile No diarrhea His anbx match his Cx Repeat BCx 6-7 are ngtd Defer to primary team re: lancing his sebaceous cyst.  Available as needed over w/e.           Bobby Rumpf Infectious Diseases (pager) [phone] [email or web address] 04/15/2017, 5:09 PM  LOS: 3 days

## 2017-04-15 NOTE — Progress Notes (Signed)
  Speech Language Pathology Treatment: Dysphagia  Patient Details Name: Erik Massey MRN: [id number] DOB: 05-18-1929 Today's Date: 04/15/2017 Time: 5681-2751 SLP Time Calculation (min) (ACUTE ONLY): 13 min  Assessment / Plan / Recommendation Clinical Impression  F/u to determine PO readiness.  Pt more alert, participatory.  Consumed 4 oz of applesauce with enthusiasm and no s/s of aspiration; pocketing/oral residue observed right oral cavity.  Thin liquids elicited immediate cough; honey-thick liquids were tolerated with no overt s/s of aspiration.  Pt confused; follows basic commands. Recommend advancing diet to dysphagia 1, honey-thick liquids.  SLP will follow Monday 6/11 for further diet advancement if appropriate. D/W RN.   HPI HPI: 83 year old male admitted with confusion, hypotension, hematuria. Increasing lethargy and weakness recently. PMH: recent hospitalization for abdominal pain/LUQ mass, dementia, hypothyroid, seizures, HOH. BSE ordered due to difficulty with pos per RN.      SLP Plan  Continue with current plan of care       Recommendations  Diet recommendations: Dysphagia 1 (puree);Honey-thick liquid Liquids provided via: Cup;Teaspoon Medication Administration: Crushed with puree Supervision: Full supervision/cueing for compensatory strategies Compensations: Slow rate;Small sips/bites Postural Changes and/or Swallow Maneuvers: Seated upright 90 degrees                Oral Care Recommendations: Oral care BID SLP Visit Diagnosis: Dysphagia, oropharyngeal phase (R13.12) Plan: Continue with current plan of care       GO                Erik Massey 04/15/2017, 3:58 PM

## 2017-04-15 NOTE — Clinical Social Work Note (Signed)
CSW received consult and is following.  Pt confused.  Attempted to contact daughter who is the primary contact but not able to connect.    [place] to see if he is able to return at d/c, waiting on a response.    CSW will continue to follow and assist with d/c planning needs.    [place], [place]

## 2017-04-16 ENCOUNTER — Inpatient Hospital Stay (HOSPITAL_COMMUNITY): Payer: Medicare Other

## 2017-04-16 DIAGNOSIS — J9600 Acute respiratory failure, unspecified whether with hypoxia or hypercapnia: Secondary | ICD-10-CM

## 2017-04-16 DIAGNOSIS — J9690 Respiratory failure, unspecified, unspecified whether with hypoxia or hypercapnia: Secondary | ICD-10-CM

## 2017-04-16 LAB — GLUCOSE, CAPILLARY
GLUCOSE-CAPILLARY: 82 mg/dL (ref 65–99)
GLUCOSE-CAPILLARY: 73 mg/dL (ref 65–99)
Glucose-Capillary: 115 mg/dL — ABNORMAL HIGH (ref 65–99)
Glucose-Capillary: 131 mg/dL — ABNORMAL HIGH (ref 65–99)
Glucose-Capillary: 89 mg/dL (ref 65–99)

## 2017-04-16 LAB — BASIC METABOLIC PANEL
Anion gap: 7 (ref 5–15)
BUN: 15 mg/dL (ref 6–20)
CHLORIDE: 112 mmol/L — AB (ref 101–111)
CO2: 20 mmol/L — AB (ref 22–32)
Calcium: 7.5 mg/dL — ABNORMAL LOW (ref 8.9–10.3)
Creatinine, Ser: 1.02 mg/dL (ref 0.61–1.24)
GFR calc Af Amer: >60 mL/min (ref >60)
GFR calc non Af Amer: >60 mL/min (ref >60)
Glucose, Bld: 77 mg/dL (ref 65–99)
Potassium: 3.4 mmol/L — ABNORMAL LOW (ref 3.5–5.1)
SODIUM: 139 mmol/L (ref 135–145)

## 2017-04-16 LAB — CBC
HEMATOCRIT: 27.2 % — AB (ref 39.0–52.0)
HEMOGLOBIN: 9.3 g/dL — AB (ref 13.0–17.0)
MCH: 30 pg (ref 26.0–34.0)
MCHC: 34.2 g/dL (ref 30.0–36.0)
MCV: 87.7 fL (ref 78.0–100.0)
Platelets: 80 10*3/uL — ABNORMAL LOW (ref 150–400)
RBC: 3.1 MIL/uL — AB (ref 4.22–5.81)
RDW: 14.9 % (ref 11.5–15.5)
WBC: 9.1 10*3/uL (ref 4.0–10.5)

## 2017-04-16 LAB — MAGNESIUM: MAGNESIUM: 1.8 mg/dL (ref 1.7–2.4)

## 2017-04-16 LAB — VANCOMYCIN, TROUGH: Vancomycin Tr: 23 ug/mL (ref 15–20)

## 2017-04-16 LAB — PHOSPHORUS: PHOSPHORUS: 2.6 mg/dL (ref 2.5–4.6)

## 2017-04-16 MED ORDER — POTASSIUM CHLORIDE 10 MEQ/100ML IV SOLN
10.0000 meq | INTRAVENOUS | Status: AC
  Administered 2017-04-16 (×2): 10 meq via INTRAVENOUS
  Filled 2017-04-16 (×2): qty 100

## 2017-04-16 MED ORDER — FINASTERIDE 5 MG PO TABS
5.0000 mg | ORAL_TABLET | Freq: Every day | ORAL | Status: DC
  Administered 2017-04-16 – 2017-05-06 (×20): 5 mg via ORAL
  Filled 2017-04-16 (×20): qty 1

## 2017-04-16 MED ORDER — VANCOMYCIN HCL 500 MG IV SOLR
500.0000 mg | Freq: Two times a day (BID) | INTRAVENOUS | Status: DC
  Administered 2017-04-16 – 2017-04-23 (×14): 500 mg via INTRAVENOUS
  Filled 2017-04-16 (×19): qty 500

## 2017-04-16 MED ORDER — TAMSULOSIN HCL 0.4 MG PO CAPS
0.4000 mg | ORAL_CAPSULE | Freq: Every day | ORAL | Status: DC
  Administered 2017-04-16 – 2017-05-06 (×19): 0.4 mg via ORAL
  Filled 2017-04-16 (×20): qty 1

## 2017-04-16 MED ORDER — ENSURE ENLIVE PO LIQD
237.0000 mL | Freq: Two times a day (BID) | ORAL | Status: DC
  Administered 2017-04-16: 237 mL via ORAL

## 2017-04-16 NOTE — Significant Event (Signed)
Called to bedside for patient in acute respiratory distress and responsive only to noxious stimuli.   He has labored and rapid breathing, coarse lung sounds, thick secretions around mouth and chin, sinus tachy in low 100's, BP mildly elevated, RR mid-30's, adequate oxygenation with 15 Lpm via NRB. Gag reflex intact on suctioning of thick secretions.   Family contacted, spoke with daughter (POA and a medical doctor) by phone, and again at the bedside. We suspect an aspiration event. We disagree about appropriate next steps. She asks that DNR status be rescinded and that he be intubated. PCCM contacted, advise respecting the POA's wishes for the patient. Anesthesia will be called for intubation and pt transferred to ICU.

## 2017-04-16 NOTE — Progress Notes (Signed)
Spoke with Caryl Pina, RN regarding obtaining consent due to pt confusion.  To notify when obtained from family.  PICC possibly to be done in the AM- has working PIV and no orders for d/c for tonight.

## 2017-04-16 NOTE — Progress Notes (Addendum)
PROGRESS NOTE    Erik Massey  OEV:[id number] DOB: Nov 26, 1928 DOA: 04/12/2017 PCP: Glendale Chard, MD    Brief Narrative: 83 year old man with dementia recent admission 5/17-5/21 for urinary retention due to BPH and cystitis, discharged with indwelling Foley to SNF for rehabilitation. He was readmitted 6/5 with altered mental status, hypotension and lactate of 6 with WBC count of 19.6. 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  Enterococcus sensitive to vancomycin.  Providencia Stuartii; sensitive to ceftriaxone.  UA with too numerous to count WBC. Urine culture with multiples bacteria. Repeat culture.  ID following. Patient will probably need 10 days IV antibiotics.  Will ordered Picc line.  Discussed with ID, no need for ECHO.   2-Sepsis, Septic Shock; off pressor.  On IV antibiotics, and fluids.  Sepsis: secondary to recurrent UTI with indwelling foley.  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   4-AKI; related to infection, hypovolemia.  Improved.  Cr peak to 1.6.   5-UTI secondary to indwelling foley catheter;  BPH - urinary retention with indwelling foley On IV antibiotics.  Discussed with urology, resume Proscar, continue with foley catheter at discharge.  Follow up in the office.   6-Mild protein calorie malnutrition;    Nutrition consulted.   7-Dysphagia;  Started on dysphagia diet. Poor oral intake.  Start ensure.   Hypophosphatemia; replaced yesterday. Repeat labs  History of seizure;  Continue with tegretol.   Acute metabolic encephalopathy Alzheimer's dementia Stable.   Anemia of chronic diseases;  Hb stable. Follow trend.   Hypokalemia; replete   Hypoglycemia, poor oral intake ; on D 10.  Diet started 6-08. Not eating enough today per nurse report.       DVT prophylaxis: SCD, no anticoagulation due to thrombocytopenia.  Code Status: DNR Family Communication: discussed with  daughter  Disposition Plan: To be determine.   Consultants:   CCM   Procedures:   none   Antimicrobials:   Ceftriaxone 6-07  Vancomycin 6-07   Subjective: He is alert, lying in bed.   Objective: Vitals:   04/15/17 2254 04/16/17 0623 04/16/17 1250 04/16/17 1300  BP: (!) 142/65 (!) 141/56 (!) 106/55 110/62  Pulse: 74 78 78   Resp: 20 20 18    Temp: 97.8 F (36.6 C) 97.8 F (36.6 C) 97.5 F (36.4 C)   TempSrc: Oral Oral Oral   SpO2: 96% 92% (!) 84% 97%  Weight:      Height:        Intake/Output Summary (Last 24 hours) at 04/16/17 1456 Last data filed at 04/16/17 1151  Gross per 24 hour  Intake          1856.25 ml  Output             1725 ml  Net           131.25 ml   Filed Weights   04/13/17  0000 04/13/17 0407 04/14/17 0620  Weight: 59.6 kg (131 lb 6.3 oz) 59.6 kg (131 lb 6.3 oz) 63.4 kg (139 lb 12.4 oz)    Examination:  General exam: Appears calm and comfortable  Respiratory system: decreased breath sound, few ronchus . Respiratory effort normal. Cardiovascular system: S1 & S2 heard, RRR. No JVD, murmurs, rubs, gallops or clicks. No pedal edema. Gastrointestinal system: Abdomen is nondistended, soft and nontender. No organomegaly or masses felt. Normal bowel sounds heard. Central nervous system: Alert, follows some command.  Extremities: moves all four extremities.  Deformity of hands.  Skin: No rashes     Data Reviewed: I have personally reviewed following labs and imaging studies  CBC:  Recent Labs Lab 04/12/17 1854 04/13/17 0306 04/13/17 0524 04/14/17 0835 04/15/17 0331 04/16/17 0547  WBC 19.6* 13.3* 19.7* 16.9* 14.4* 9.1  NEUTROABS 18.4*  --  18.3*  --   --   --   HGB 10.2* 9.6* 9.8* 8.9* 9.5* 9.3*  HCT 30.6* 28.4* 29.5* 26.0* 28.0* 27.2*  MCV 92.2 91.0 91.6 89.3 88.6 87.7  PLT 127* 88* 110* 89* 91* 80*   Basic Metabolic Panel:  Recent Labs Lab 04/12/17 1854 04/13/17 0524 04/14/17 0835 04/15/17 0331 04/16/17 0547  NA 144 143 141 137 139  K 3.7 3.7 3.5 3.6 3.4*  CL 112* 115* 117* 113* 112*  CO2 20* 18* 20* 18* 20*  GLUCOSE 79 92 78 103* 77  BUN 32* 30* 26* 22* 15  CREATININE 1.68* 1.49* 1.24 1.06 1.02  CALCIUM 8.0* 7.2* 7.1* 7.1* 7.5*  MG  --   --  1.8 1.7 1.8  PHOS  --   --  1.8* 1.9* 2.6   GFR: Estimated Creatinine Clearance: 47.5 mL/min (by C-G formula based on SCr of 1.02 mg/dL). Liver Function Tests:  Recent Labs Lab 04/12/17 1854 04/13/17 0524  AST 90* 115*  ALT 57 72*  ALKPHOS 103 87  BILITOT 0.6 0.5  PROT 5.5* 5.0*  ALBUMIN 2.8* 2.4*   No results for input(s): LIPASE, AMYLASE in the last 168 hours. No results for input(s): AMMONIA in the last 168 hours. Coagulation Profile:  Recent Labs Lab 04/13/17 0109  INR 1.26   Cardiac Enzymes: No results for input(s): CKTOTAL, CKMB, CKMBINDEX, TROPONINI in the last 168 hours. BNP (last 3 results) No results for input(s): PROBNP in the last 8760 hours. HbA1C: No results for input(s): HGBA1C in the last 72 hours. CBG:  Recent Labs Lab 04/15/17 1508 04/15/17 1726 04/16/17 0212 04/16/17 0800 04/16/17 1256  GLUCAP 112* 108* 82 73 115*   Lipid Profile: No results for input(s): CHOL, HDL, LDLCALC, TRIG, CHOLHDL, LDLDIRECT in the last 72 hours. Thyroid Function Tests: No results for input(s): TSH, T4TOTAL, FREET4, T3FREE, THYROIDAB in the last 72  hours. Anemia Panel: No results for input(s): VITAMINB12, FOLATE, FERRITIN, TIBC, IRON, RETICCTPCT in the last 72 hours. Sepsis Labs:  Recent Labs Lab 04/12/17 1908 04/12/17 2201 04/13/17 0109 04/13/17 0524  PROCALCITON  --   --  106.03  --   LATICACIDVEN 6.17* 5.15* 4.6* 3.7*    Recent Results (from the past 240 hour(s))  C difficile quick scan w PCR reflex     Status: Abnormal   Collection Time: 04/12/17  5:33 PM  Result Value Ref Range Status   C Diff antigen POSITIVE (A) NEGATIVE Final   C Diff toxin NEGATIVE NEGATIVE Final   C Diff interpretation Results are indeterminate. See PCR results.  Final  Clostridium Difficile by PCR  Status: Abnormal   Collection Time: 04/12/17  5:33 PM  Result Value Ref Range Status   Toxigenic C Difficile by pcr POSITIVE (A) NEGATIVE Final    Comment: Positive for toxigenic C. difficile with little to no toxin production. Only treat if clinical presentation suggests symptomatic illness.  Blood Culture (routine x 2)     Status: Abnormal   Collection Time: 04/12/17  6:50 PM  Result Value Ref Range Status   Specimen Description BLOOD LEFT HAND  Final   Special Requests IN PEDIATRIC BOTTLE Blood Culture adequate volume  Final   Culture  Setup Time   Final    GRAM NEGATIVE RODS IN PEDIATRIC BOTTLE CRITICAL RESULT CALLED TO, READ BACK BY AND VERIFIED WITH: A. PHAM PHARMD, AT 1518 04/13/17 BY Rush Landmark Performed at [facility], [place] [address] [place], [place] [address]    Culture PROVIDENCIA STUARTII (A)  Final   Report Status 04/15/2017 FINAL  Final   Organism ID, Bacteria PROVIDENCIA STUARTII  Final      Susceptibility   Providencia stuartii - MIC*    AMPICILLIN >=32 RESISTANT Resistant     CEFAZOLIN >=64 RESISTANT Resistant     CEFEPIME <=1 SENSITIVE Sensitive     CEFTAZIDIME <=1 SENSITIVE Sensitive     CEFTRIAXONE <=1 SENSITIVE Sensitive     CIPROFLOXACIN >=4 RESISTANT Resistant     GENTAMICIN RESISTANT Resistant     IMIPENEM  1 SENSITIVE Sensitive     TRIMETH/SULFA >=320 RESISTANT Resistant     AMPICILLIN/SULBACTAM >=32 RESISTANT Resistant     PIP/TAZO <=4 SENSITIVE Sensitive     * PROVIDENCIA STUARTII  Blood Culture ID Panel (Reflexed)     Status: None   Collection Time: 04/12/17  6:50 PM  Result Value Ref Range Status   Enterococcus species NOT DETECTED NOT DETECTED Final   Listeria monocytogenes NOT DETECTED NOT DETECTED Final   Staphylococcus species NOT DETECTED NOT DETECTED Final   Staphylococcus aureus NOT DETECTED NOT DETECTED Final   Streptococcus species NOT DETECTED NOT DETECTED Final   Streptococcus agalactiae NOT DETECTED NOT DETECTED Final   Streptococcus pneumoniae NOT DETECTED NOT DETECTED Final   Streptococcus pyogenes NOT DETECTED NOT DETECTED Final   Acinetobacter baumannii NOT DETECTED NOT DETECTED Final   Enterobacteriaceae species NOT DETECTED NOT DETECTED Final   Enterobacter cloacae complex NOT DETECTED NOT DETECTED Final   Escherichia coli NOT DETECTED NOT DETECTED Final   Klebsiella oxytoca NOT DETECTED NOT DETECTED Final   Klebsiella pneumoniae NOT DETECTED NOT DETECTED Final   Proteus species NOT DETECTED NOT DETECTED Final   Serratia marcescens NOT DETECTED NOT DETECTED Final   Haemophilus influenzae NOT DETECTED NOT DETECTED Final   Neisseria meningitidis NOT DETECTED NOT DETECTED Final   Pseudomonas aeruginosa NOT DETECTED NOT DETECTED Final   Candida albicans NOT DETECTED NOT DETECTED Final   Candida glabrata NOT DETECTED NOT DETECTED Final   Candida krusei NOT DETECTED NOT DETECTED Final   Candida parapsilosis NOT DETECTED NOT DETECTED Final   Candida tropicalis NOT DETECTED NOT DETECTED Final    Comment: Performed at [facility], [place] [address], [place], [place] [address]  Blood Culture (routine x 2)     Status: Abnormal   Collection Time: 04/12/17 10:20 PM  Result Value Ref Range Status   Specimen Description BLOOD BLOOD RIGHT FOREARM  Final   Special Requests    Final    BOTTLES DRAWN AEROBIC AND ANAEROBIC Blood Culture adequate volume   Culture  Setup  Time   Final    GRAM POSITIVE COCCI IN PAIRS IN BOTH AEROBIC AND ANAEROBIC BOTTLES CRITICAL RESULT CALLED TO, READ BACK BY AND VERIFIED WITH: D. ZIEGLER, RPHARMD ([facility]) AT 1800 ON 04/13/17 BY C. JESSUP, MLT. Performed at [facility], [place] [address] [place], [place] [address]    Culture ENTEROCOCCUS FAECALIS (A)  Final   Report Status 04/15/2017 FINAL  Final   Organism ID, Bacteria ENTEROCOCCUS FAECALIS  Final      Susceptibility   Enterococcus faecalis - MIC*    AMPICILLIN <=2 SENSITIVE Sensitive     VANCOMYCIN 4 SENSITIVE Sensitive     GENTAMICIN SYNERGY RESISTANT Resistant     * ENTEROCOCCUS FAECALIS  Blood Culture ID Panel (Reflexed)     Status: Abnormal   Collection Time: 04/12/17 10:20 PM  Result Value Ref Range Status   Enterococcus species DETECTED (A) NOT DETECTED Final    Comment: CRITICAL RESULT CALLED TO, READ BACK BY AND VERIFIED WITH: D. ZIEGLER, RPHARMD AT 1800 ON 04/13/17 BY C. JESSUP, MLT.    Vancomycin resistance NOT DETECTED NOT DETECTED Final   Listeria monocytogenes NOT DETECTED NOT DETECTED Final   Staphylococcus species NOT DETECTED NOT DETECTED Final   Staphylococcus aureus NOT DETECTED NOT DETECTED Final   Streptococcus species NOT DETECTED NOT DETECTED Final   Streptococcus agalactiae NOT DETECTED NOT DETECTED Final   Streptococcus pneumoniae NOT DETECTED NOT DETECTED Final   Streptococcus pyogenes NOT DETECTED NOT DETECTED Final   Acinetobacter baumannii NOT DETECTED NOT DETECTED Final   Enterobacteriaceae species NOT DETECTED NOT DETECTED Final   Enterobacter cloacae complex NOT DETECTED NOT DETECTED Final   Escherichia coli NOT DETECTED NOT DETECTED Final   Klebsiella oxytoca NOT DETECTED NOT DETECTED Final   Klebsiella pneumoniae NOT DETECTED NOT DETECTED Final   Proteus species NOT DETECTED NOT DETECTED Final   Serratia marcescens NOT DETECTED NOT  DETECTED Final   Haemophilus influenzae NOT DETECTED NOT DETECTED Final   Neisseria meningitidis NOT DETECTED NOT DETECTED Final   Pseudomonas aeruginosa NOT DETECTED NOT DETECTED Final   Candida albicans NOT DETECTED NOT DETECTED Final   Candida glabrata NOT DETECTED NOT DETECTED Final   Candida krusei NOT DETECTED NOT DETECTED Final   Candida parapsilosis NOT DETECTED NOT DETECTED Final   Candida tropicalis NOT DETECTED NOT DETECTED Final    Comment: Performed at [facility], [place] [address] [place], [place] [address]  MRSA PCR Screening     Status: None   Collection Time: 04/13/17 12:01 AM  Result Value Ref Range Status   MRSA by PCR NEGATIVE NEGATIVE Final    Comment:        The GeneXpert MRSA Assay (FDA approved for NASAL specimens only), is one component of a comprehensive MRSA colonization surveillance program. It is not intended to diagnose MRSA infection nor to guide or monitor treatment for MRSA infections.   Culture, Urine     Status: Abnormal   Collection Time: 04/13/17  1:37 AM  Result Value Ref Range Status   Specimen Description URINE, RANDOM  Final   Special Requests NONE  Final   Culture MULTIPLE SPECIES PRESENT, SUGGEST RECOLLECTION (A)  Final   Report Status 04/14/2017 FINAL  Final  Culture, blood (routine x 2)     Status: None (Preliminary result)   Collection Time: 04/14/17  4:52 PM  Result Value Ref Range Status   Specimen Description BLOOD RIGHT HAND  Final   Special Requests   Final  BOTTLES DRAWN AEROBIC AND ANAEROBIC Blood Culture adequate volume   Culture   Final    NO GROWTH 2 DAYS Performed at [facility], [place] [address], [place], [place] [address]    Report Status PENDING  Incomplete  Culture, blood (routine x 2)     Status: None (Preliminary result)   Collection Time: 04/14/17  4:52 PM  Result Value Ref Range Status   Specimen Description BLOOD RIGHT HAND  Final   Special Requests   Final    BOTTLES DRAWN AEROBIC AND  ANAEROBIC Blood Culture adequate volume   Culture   Final    NO GROWTH 2 DAYS Performed at [facility], [place] [address] [place], [place] [address]    Report Status PENDING  Incomplete         Radiology Studies: No results found.      Scheduled Meds: . betaxolol  1 drop Right Eye Daily  . carBAMazepine  200 mg Oral QID  . dorzolamide  1 drop Right Eye Q12H  . latanoprost  1 drop Right Eye QHS  . magnesium oxide  400 mg Oral Daily  . thyroid  90 mg Oral Daily   Continuous Infusions: . cefTRIAXone (ROCEPHIN)  IV Stopped (04/15/17 2312)  . dextrose 75 mL/hr at 04/16/17 0925  . phenylephrine (NEO-SYNEPHRINE) Adult infusion Stopped (04/14/17 2352)  . vancomycin       LOS: 4 days    Time spent: 35 minutes.     Elmarie Shiley, MD [facility] Pager [phone]  If 7PM-7AM, please contact night-coverage [email or web address] Password [email or web address] 04/16/2017, 2:56 PM

## 2017-04-16 NOTE — Progress Notes (Signed)
Pharmacy Antibiotic Note  Erik Massey is a 83 y.o. male admitted on 04/12/2017 with sepsis.  Pharmacy has been consulted for vanocmycin dosing.  04/16/2017 VT=23 supratherapeutic on 750mg  IV q12h Scr 1.02, CrCl ~ 36mls/min WBC 9.1  Plan: Decrease vancomycin to 500mg  IV q12h Continue to follow renal function and obtain vanc trough at steady state  Height: 5\' 9"  (175.3 cm) Weight: 139 lb 12.4 oz (63.4 kg) IBW/kg (Calculated) : 70.7  Temp (24hrs), Avg:97.7 F (36.5 C), Min:97.3 F (36.3 C), Max:97.8 F (36.6 C)   Recent Labs Lab 04/12/17 1854 04/12/17 1908 04/12/17 2201 04/13/17 0109 04/13/17 0306 04/13/17 0524 04/14/17 0835 04/15/17 0331 04/15/17 1149 04/16/17 0547 04/16/17 1043  WBC 19.6*  --   --   --  13.3* 19.7* 16.9* 14.4*  --  9.1  --   CREATININE 1.68*  --   --   --   --  1.49* 1.24 1.06  --  1.02  --   LATICACIDVEN  --  6.17* 5.15* 4.6*  --  3.7*  --   --   --   --   --   VANCOTROUGH  --   --   --   --   --   --   --   --  19  --  23*    Estimated Creatinine Clearance: 47.5 mL/min (by C-G formula based on SCr of 1.02 mg/dL).    Allergies  Allergen Reactions  . Penicillins Swelling    Has patient had a PCN reaction causing immediate rash, facial/tongue/throat swelling, SOB or lightheadedness with hypotension: yes Has patient had a PCN reaction causing severe rash involving mucus membranes or skin necrosis: no Has patient had a PCN reaction that required hospitalization- yes already in the hospital Has patient had a PCN reaction occurring within the last 10 years: No If all of the above answers are "NO", then may proceed with Cephalosporin use.     Antimicrobials this admission: 6/5 cefepime>>6/8 6/5 vancomycin>> 6/7 CTX>> 6/5 levofloxacin >> 6/6 6/6 PO vanc >>6/7 6/6 IV flagyl >>6/7   Microbiology results: 6/5BCx at 1850: 1/2 Providencia stuartii (S= cefe, ceftaz, CTX, imi, zosyn) (nothing on BCID) 6/5 BCx at 2220: 1/2 Enterococcus faecalis  (S= amp, vanc) (BCID=enterococcus no resistance) FINAL 6/5 C.Diff Ag/PCR: POSITIVE, Toxin: NEGATIVE (no stools per RN but tx since WBC elevated per MD)  6/6 UCx: multiple species, need recollection FINAL 6/6 MRSA PCR: neg 6/7 bcx x2:  Thank you for allowing pharmacy to be a part of this patient's care.  Dolly Rias RPh 04/16/2017, 11:45 AM Pager [phone]

## 2017-04-17 ENCOUNTER — Inpatient Hospital Stay (HOSPITAL_COMMUNITY): Payer: Medicare Other

## 2017-04-17 ENCOUNTER — Inpatient Hospital Stay (HOSPITAL_COMMUNITY): Payer: Medicare Other | Admitting: Registered Nurse

## 2017-04-17 LAB — CBC
HEMATOCRIT: 30.4 % — AB (ref 39.0–52.0)
HEMOGLOBIN: 10.2 g/dL — AB (ref 13.0–17.0)
MCH: 29.4 pg (ref 26.0–34.0)
MCHC: 33.6 g/dL (ref 30.0–36.0)
MCV: 87.6 fL (ref 78.0–100.0)
Platelets: 86 10*3/uL — ABNORMAL LOW (ref 150–400)
RBC: 3.47 MIL/uL — ABNORMAL LOW (ref 4.22–5.81)
RDW: 14.7 % (ref 11.5–15.5)
WBC: 5.6 10*3/uL (ref 4.0–10.5)

## 2017-04-17 LAB — URINE CULTURE: Culture: NO GROWTH

## 2017-04-17 LAB — BLOOD GAS, ARTERIAL
Acid-base deficit: 4.7 mmol/L — ABNORMAL HIGH (ref 0.0–2.0)
BICARBONATE: 17.4 mmol/L — AB (ref 20.0–28.0)
Drawn by: 236041
FIO2: 100
LHR: 16 {breaths}/min
O2 Saturation: 97.2 %
PATIENT TEMPERATURE: 98.6
PCO2 ART: 24.2 mmHg — AB (ref 32.0–48.0)
PEEP: 8 cmH2O
PO2 ART: 95.6 mmHg (ref 83.0–108.0)
VT: 510 mL
pH, Arterial: 7.471 — ABNORMAL HIGH (ref 7.350–7.450)

## 2017-04-17 LAB — RETICULOCYTES
RBC.: 3.47 MIL/uL — ABNORMAL LOW (ref 4.22–5.81)
Retic Count, Absolute: 13.9 10*3/uL — ABNORMAL LOW (ref 19.0–186.0)
Retic Ct Pct: 0.4 % (ref 0.4–3.1)

## 2017-04-17 LAB — BASIC METABOLIC PANEL
ANION GAP: 7 (ref 5–15)
BUN: 20 mg/dL (ref 6–20)
CHLORIDE: 112 mmol/L — AB (ref 101–111)
CO2: 20 mmol/L — AB (ref 22–32)
CREATININE: 1.09 mg/dL (ref 0.61–1.24)
Calcium: 7.8 mg/dL — ABNORMAL LOW (ref 8.9–10.3)
GFR calc non Af Amer: 60 mL/min — ABNORMAL LOW (ref >60)
GLUCOSE: 120 mg/dL — AB (ref 65–99)
Potassium: 3.8 mmol/L (ref 3.5–5.1)
Sodium: 139 mmol/L (ref 135–145)

## 2017-04-17 LAB — GLUCOSE, CAPILLARY
GLUCOSE-CAPILLARY: 120 mg/dL — AB (ref 65–99)
GLUCOSE-CAPILLARY: 123 mg/dL — AB (ref 65–99)
Glucose-Capillary: 103 mg/dL — ABNORMAL HIGH (ref 65–99)

## 2017-04-17 LAB — IRON AND TIBC
Iron: 25 ug/dL — ABNORMAL LOW (ref 45–182)
SATURATION RATIOS: 15 % — AB (ref 17.9–39.5)
TIBC: 169 ug/dL — ABNORMAL LOW (ref 250–450)
UIBC: 144 ug/dL

## 2017-04-17 LAB — VITAMIN B12: VITAMIN B 12: 3175 pg/mL — AB (ref 180–914)

## 2017-04-17 LAB — FOLATE: FOLATE: 22.8 ng/mL (ref >5.9)

## 2017-04-17 LAB — FERRITIN: Ferritin: 177 ng/mL (ref 24–336)

## 2017-04-17 LAB — TRIGLYCERIDES: Triglycerides: 103 mg/dL (ref <150)

## 2017-04-17 LAB — PHOSPHORUS: Phosphorus: 2.4 mg/dL — ABNORMAL LOW (ref 2.5–4.6)

## 2017-04-17 MED ORDER — SUCCINYLCHOLINE CHLORIDE 200 MG/10ML IV SOSY
PREFILLED_SYRINGE | INTRAVENOUS | Status: AC
  Filled 2017-04-17: qty 10

## 2017-04-17 MED ORDER — FENTANYL BOLUS VIA INFUSION
25.0000 ug | INTRAVENOUS | Status: DC | PRN
  Filled 2017-04-17: qty 25

## 2017-04-17 MED ORDER — FAMOTIDINE IN NACL 20-0.9 MG/50ML-% IV SOLN
20.0000 mg | Freq: Two times a day (BID) | INTRAVENOUS | Status: DC
  Administered 2017-04-17 – 2017-04-24 (×15): 20 mg via INTRAVENOUS
  Filled 2017-04-17 (×15): qty 50

## 2017-04-17 MED ORDER — SODIUM CHLORIDE 0.9 % IV SOLN
25.0000 ug/h | INTRAVENOUS | Status: DC
  Administered 2017-04-17 – 2017-04-18 (×2): 75 ug/h via INTRAVENOUS
  Filled 2017-04-17 (×2): qty 50

## 2017-04-17 MED ORDER — PROPOFOL 1000 MG/100ML IV EMUL
5.0000 ug/kg/min | INTRAVENOUS | Status: DC
  Administered 2017-04-17: 40 ug/kg/min via INTRAVENOUS

## 2017-04-17 MED ORDER — ORAL CARE MOUTH RINSE
15.0000 mL | Freq: Four times a day (QID) | OROMUCOSAL | Status: DC
  Administered 2017-04-17 – 2017-04-30 (×52): 15 mL via OROMUCOSAL

## 2017-04-17 MED ORDER — ACETAMINOPHEN 650 MG RE SUPP: 650.0000 mg | Freq: Four times a day (QID) | RECTAL | Status: DC | PRN

## 2017-04-17 MED ORDER — PROPOFOL 10 MG/ML IV BOLUS
INTRAVENOUS | Status: AC
  Filled 2017-04-17: qty 20

## 2017-04-17 MED ORDER — CHLORHEXIDINE GLUCONATE 0.12% ORAL RINSE (MEDLINE KIT)
15.0000 mL | Freq: Two times a day (BID) | OROMUCOSAL | Status: DC
  Administered 2017-04-17 – 2017-04-30 (×27): 15 mL via OROMUCOSAL

## 2017-04-17 MED ORDER — ACETAMINOPHEN 325 MG PO TABS
650.0000 mg | ORAL_TABLET | Freq: Four times a day (QID) | ORAL | Status: DC | PRN
  Administered 2017-04-19 – 2017-04-21 (×5): 650 mg
  Filled 2017-04-17 (×5): qty 2

## 2017-04-17 MED ORDER — PROPOFOL 1000 MG/100ML IV EMUL
INTRAVENOUS | Status: AC
  Filled 2017-04-17: qty 100

## 2017-04-17 MED ORDER — FENTANYL CITRATE (PF) 100 MCG/2ML IJ SOLN: 50.0000 ug | Freq: Once | INTRAMUSCULAR | Status: DC

## 2017-04-17 MED ORDER — METRONIDAZOLE IN NACL 5-0.79 MG/ML-% IV SOLN
500.0000 mg | Freq: Three times a day (TID) | INTRAVENOUS | Status: DC
  Administered 2017-04-17 – 2017-04-18 (×3): 500 mg via INTRAVENOUS
  Filled 2017-04-17 (×4): qty 100

## 2017-04-17 MED ORDER — THYROID 60 MG PO TABS
90.0000 mg | ORAL_TABLET | Freq: Every day | ORAL | Status: DC
  Administered 2017-04-18 – 2017-05-06 (×19): 90 mg
  Filled 2017-04-17 (×19): qty 1

## 2017-04-17 MED ORDER — SUCCINYLCHOLINE CHLORIDE 20 MG/ML IJ SOLN
INTRAMUSCULAR | Status: DC | PRN
  Administered 2017-04-17: 120 mg via INTRAVENOUS

## 2017-04-17 MED ORDER — MAGNESIUM OXIDE 400 (241.3 MG) MG PO TABS
400.0000 mg | ORAL_TABLET | Freq: Every day | ORAL | Status: DC
  Administered 2017-04-18 – 2017-04-30 (×13): 400 mg
  Filled 2017-04-17 (×13): qty 1

## 2017-04-17 MED ORDER — CARBAMAZEPINE 100 MG/5ML PO SUSP
200.0000 mg | Freq: Four times a day (QID) | ORAL | Status: DC
  Administered 2017-04-17 – 2017-05-06 (×75): 200 mg
  Filled 2017-04-17 (×81): qty 10

## 2017-04-17 MED ORDER — PROPOFOL 10 MG/ML IV BOLUS
INTRAVENOUS | Status: DC | PRN
  Administered 2017-04-17: 70 mg via INTRAVENOUS

## 2017-04-17 MED ORDER — DEXTROSE 5 % IV SOLN
2.0000 g | INTRAVENOUS | Status: DC
  Administered 2017-04-17 – 2017-04-24 (×8): 2 g via INTRAVENOUS
  Filled 2017-04-17 (×9): qty 2

## 2017-04-17 MED ORDER — SODIUM CHLORIDE 0.9 % IV SOLN
INTRAVENOUS | Status: DC | PRN
  Administered 2017-04-17: via INTRAVENOUS

## 2017-04-17 NOTE — Progress Notes (Addendum)
PULMONARY / CRITICAL CARE MEDICINE   Name: Erik Massey MRN: [id number] DOB: 05-10-1929    ADMISSION DATE:  04/12/2017 CONSULTATION DATE:  6/6  REFERRING MD:  Erik Massey   CHIEF COMPLAINT:  Shock  HISTORY OF PRESENT ILLNESS:    83 year old man with dementia recent admission 5/17-5/21 for urinary retention due to BPH and cystitis, discharged with indwelling Foley to SNF for rehabilitation. He was readmitted 6/5 with altered mental status, hypotension and lactate of 6 with WBC count of 19.6. 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 Code status reversed by family  SUBJECTIVE:  Maintain on the vent overnight. Blood pressure low, not on pressors  VITAL SIGNS: BP (!) 107/52   Pulse 85   Temp 98.3 F (36.8 C) (Axillary)   Resp 16   Ht 5\' 6"  (1.676 m)   Wt 139 lb 12.4 oz (63.4 kg)   SpO2 95%   BMI 20.64 kg/m   HEMODYNAMICS:   VENTILATOR SETTINGS: Vent Mode: PRVC FiO2 (%):  [55 %-100 %] 70 % Set Rate:  [16 bmp] 16 bmp Vt Set:  [510 mL] 510 mL PEEP:  [8 cmH20] 8 cmH20 Plateau Pressure:  [17 cmH20-21 cmH20] 20 cmH20  INTAKE / OUTPUT: I/O last 3 completed shifts: In: 3562.3 [P.O.:100; I.V.:2812.3; IV Piggyback:650] Out: 2200 [Urine:2200]  PHYSICAL EXAMINATION: Gen:      Catchetic, no distress HEENT:  EOMI, sclera anicteric Neck:     No masses; no thyromegaly Lungs:    Clear to auscultation bilaterally; normal respiratory effort CV:         Regular rate and rhythm; no murmurs Abd:      + bowel sounds; soft, non-tender; no palpable masses, no distension Ext:    No edema; adequate peripheral perfusion Skin:      Warm and dry; no rash Neuro: Sedated, unresponsive  LABS:  BMET  Recent Labs Lab 04/15/17 0331 04/16/17 0547 04/17/17 0132  NA 137 139 139  K 3.6 3.4* 3.8  CL 113* 112* 112*  CO2 18* 20* 20*   BUN 22* 15 20  CREATININE 1.06 1.02 1.09  GLUCOSE 103* 77 120*    Electrolytes  Recent Labs Lab 04/14/17 0835 04/15/17 0331 04/16/17 0547 04/17/17 0132  CALCIUM 7.1* 7.1* 7.5* 7.8*  MG 1.8 1.7 1.8  --   PHOS 1.8* 1.9* 2.6 2.4*    CBC  Recent Labs Lab 04/15/17 0331 04/16/17 0547 04/17/17 0132  WBC 14.4* 9.1 5.6  HGB 9.5* 9.3* 10.2*  HCT 28.0* 27.2* 30.4*  PLT 91* 80* 86*    Coag's  Recent Labs Lab 04/13/17 0109  APTT 31  INR 1.26    Sepsis Markers  Recent Labs Lab 04/12/17 2201 04/13/17 0109 04/13/17 0524  LATICACIDVEN 5.15* 4.6* 3.7*  PROCALCITON  --  106.03  --     ABG  Recent Labs Lab 04/17/17 0235  PHART 7.471*  PCO2ART 24.2*  PO2ART 95.6    Liver Enzymes  Recent Labs Lab 04/12/17 1854 04/13/17 0524  AST 90* 115*  ALT 57 72*  ALKPHOS 103 87  BILITOT 0.6 0.5  ALBUMIN 2.8* 2.4*    Cardiac Enzymes No results for input(s): TROPONINI, PROBNP in the last 168 hours.  Glucose  Recent Labs Lab 04/16/17 0800 04/16/17 1256 04/16/17 1741 04/16/17 2343 04/17/17 0723 04/17/17 1216  GLUCAP 73 115* 89 131* 103* 120*    Imaging  Dg Chest Port 1 View  Result Date: 04/16/2017 CLINICAL DATA:  Respiratory failure EXAM: PORTABLE CHEST 1 VIEW COMPARISON:  04/12/2017 FINDINGS: Patchy bilateral lower lobe opacities, atelectasis versus pneumonia. Superimposed layering bilateral pleural effusions. Mild frank interstitial edema is suspected. The heart is normal in size. IMPRESSION: Mild interstitial edema with layering bilateral pleural effusion. Underlying patchy bilateral lower lobe opacities, atelectasis versus pneumonia. Electronically Signed   By: Julian Hy M.D.   On: 04/16/2017 23:57   STUDIES:  CXR 6/9- worsening bilateral opacities likely pleural effusion with pneumonia.  CULTURES: Blood cultures 6/5 > enterococcus , GNR C-dif 6/5 > antigen positive, toxin neg  ANTIBIOTICS: Cefepime  6/5 >6/7 ceftx 6/7 >> Levaquin 6/5>>  6/5 Vancomycin 6/5 >  SIGNIFICANT EVENTS: 5/17 > 5/21 admit for urinary retention 6/5 > admit for sepsis 6/9> Rapid response. Intubated  LINES/TUBES: ETT 6/9 >  DISCUSSION: 83 year old male with dementia and recent admit for urinary retention  presenting with septic shock-enterococcal + GNR bacteremia, likely source being UTI  c diff  Likely colonisation-prior CT scan had shown proctocolitis Now back intubated after respiratory distress due to presumed aspiration  ASSESSMENT / PLAN:   CARDIOVASCULAR A:  Septic shock - resolved No need for pressors at present  P:  Telemetry monitoring  RENAL A:   AKI -resolved  hypophosphatemia BPH - urinary retention with indwelling foley  P:   Monitor urine output and Cr Maintain foleys  GASTROINTESTINAL A:   Mild protein calorie malnutrition Dysphagia worse compared to 2 wks ago - due to acute illness  P:   Protonix for SUP Place NG tube  HEMATOLOGIC A:   Anemia of chronic disease  (hgb close to baseline)  P:  Follow CBC  INFECTIOUS A:   Sepsis: secondary to recurrent UTI with indwelling foley.  LLL opacity on CXR -favor atx C diff colonisation Now with new aspiration, HCAP P:   Continue vanco. Change ceftriaxone to zosyn Follow cultures, Pct  ENDOCRINE A:   Hypoglycemia P:   Start tube feeds after NG placement  NEUROLOGIC A:   Acute metabolic encephalopathy Alzheimer's dementia  P:   RASS goal: 0 Fentanyl gtt for sedation  FAMILY  - Updates: Daughter  Erik Massey updated yesterday and today. Intubated but still DNR  The patient is critically ill with multiple organ system failure and requires high complexity decision making for assessment and support, frequent evaluation and titration of therapies, advanced monitoring, review of radiographic studies and interpretation of complex data.   Critical Care Time devoted to patient care services, exclusive of separately billable procedures,  described in this note is 35  minutes.   Erik Garfinkel MD [facility] Pager [phone] If no answer or after 3pm call: [phone] 04/17/2017, 1:16 PM

## 2017-04-17 NOTE — Anesthesia Procedure Notes (Signed)
Procedure Name: Intubation Date/Time: 04/17/2017 12:15 AM Performed by: Lissa Morales Pre-anesthesia Checklist: Patient identified, Emergency Drugs available, Suction available and Patient being monitored Patient Re-evaluated:Patient Re-evaluated prior to inductionOxygen Delivery Method: Ambu bag Preoxygenation: Pre-oxygenation with 100% oxygen Intubation Type: IV induction Ventilation: Mask ventilation without difficulty Laryngoscope Size: Glidescope, 4 and Mac Grade View: Grade I Tube type: Oral Tube size: 7.5 (taper ETT) mm Number of attempts: 1 Airway Equipment and Method: Stylet,  Oral airway and Video-laryngoscopy Placement Confirmation: ETT inserted through vocal cords under direct vision,  positive ETCO2 and breath sounds checked- equal and bilateral Secured at: 22 cm Tube secured with: Tape Dental Injury: Teeth and Oropharynx as per pre-operative assessment

## 2017-04-17 NOTE — Social Work (Addendum)
CSW obtained VQMGQ#[id number] A. FL2 started as pt not medically stable for PT. Plse complete FL2 once patient stable. Patient from [facility].  CSW following up with facility on possible return to [facility] once medially stable.

## 2017-04-17 NOTE — Progress Notes (Signed)
OT Cancellation Note  Patient Details Name: Erik Massey MRN: [id number] DOB: 01-Jan-1929   Cancelled Treatment:    Reason Eval/Treat Not Completed: Patient not medically ready rapid response called and pt intubated   Parke Poisson B 04/17/2017, 7:26 AM

## 2017-04-17 NOTE — Progress Notes (Signed)
Pharmacy Antibiotic Note  Erik Massey is a 83 y.o. male admitted on 04/12/2017 from nursing home with sepsis. Patient was started on vancomycin, levaquin and cefepime on admission.  Providencia stuartii and enterococcus noted in bcx with ID de-escalated abx to vancomycin and ceftriaxone.  Patient went into respiratory distress early this morning (6/10) and is now re-intubated. To change ceftriaxone to cefepime/flagyl (pt has PCN allergy) for suspected new aspiration/HCAP.   Plan: - cont vancomycin 500 mg IV q12h - cefepime 2gm IV q24 - flagyl 500 mg IV q8h - monitor renal function closely - f/u cultures _____________________________________  Height: 5\' 6"  (167.6 cm) Weight: 139 lb 12.4 oz (63.4 kg) IBW/kg (Calculated) : 63.8  Temp (24hrs), Avg:98.4 F (36.9 C), Min:97.8 F (36.6 C), Max:98.9 F (37.2 C)   Recent Labs Lab 04/12/17 1908 04/12/17 2201 04/13/17 0109  04/13/17 0524 04/14/17 0835 04/15/17 0331 04/15/17 1149 04/16/17 0547 04/16/17 1043 04/17/17 0132  WBC  --   --   --   < > 19.7* 16.9* 14.4*  --  9.1  --  5.6  CREATININE  --   --   --   --  1.49* 1.24 1.06  --  1.02  --  1.09  LATICACIDVEN 6.17* 5.15* 4.6*  --  3.7*  --   --   --   --   --   --   VANCOTROUGH  --   --   --   --   --   --   --  19  --  23*  --   < > = values in this interval not displayed.  Estimated Creatinine Clearance: 44.4 mL/min (by C-G formula based on SCr of 1.09 mg/dL).    Allergies  Allergen Reactions  . Penicillins Swelling    Has patient had a PCN reaction causing immediate rash, facial/tongue/throat swelling, SOB or lightheadedness with hypotension: yes Has patient had a PCN reaction causing severe rash involving mucus membranes or skin necrosis: no Has patient had a PCN reaction that required hospitalization- yes already in the hospital Has patient had a PCN reaction occurring within the last 10 years: No If all of the above answers are "NO", then may proceed with  Cephalosporin use.    Antimicrobials this admission:  6/5 cefepime>>6/8>> resume 6/10>> 6/5 vancomycin>> 6/7 CTX>> 6/10 6/5 levofloxacin >> 6/6 6/6 PO vanc >>6/7 6/6 IV flagyl >>6/7>> resume 6/10>>  Dose adjustments this admission:  6/6 increase vancomycin from 500mg  IV q24h to 750mg  q24h for improved SCr 6/7 increase vancomycin 750 mg q12h for improved SCr 6/8 VT at 1149 (per RN) = 19  (on 750 mg q12h) 6/9 VT= 23 (on 750mg  IV q12h) -- reduced to 500 mg q12h  Microbiology results:  6/5BCx at 1850: 1/2 Providencia stuartii (S= cefe, ceftaz, CTX, imi, zosyn) (nothing on BCID) 6/5 BCx at 2220: 1/2 Enterococcus faecalis (S= amp, vanc) (BCID=enterococcus no resistance) FINAL 6/5 C.Diff Ag/PCR: POSITIVE, Toxin: NEGATIVE (no stools per RN but tx since WBC elevated per MD)  6/6 UCx: multiple species, need recollection FINAL 6/6 MRSA PCR: neg 6/7 bcx x2: 6/9 ucx:    Thank you for allowing pharmacy to be a part of this patient's care.  Lynelle Doctor 04/17/2017 1:31 PM

## 2017-04-17 NOTE — Progress Notes (Signed)
Patient now intubated, CCM will take over patient care.

## 2017-04-17 NOTE — Progress Notes (Signed)
PULMONARY / CRITICAL CARE MEDICINE   Name: Erik Massey MRN: [id number] DOB: 02/08/1929    ADMISSION DATE:  04/12/2017 CONSULTATION DATE:  6/6  REFERRING MD:  [name]   CHIEF COMPLAINT:  Dyspnea  HISTORY OF PRESENT ILLNESS:    See prior notes. Since last on PCCM service, had apparent aspiration event with increased WOB and worsening hypoxia. Code status was clarified to be DNR but OK with intubation.  SUBJECTIVE:  Intubated by anesthesia.   VITAL SIGNS: BP (!) 90/44   Pulse (!) 113   Temp 98.6 F (37 C) (Oral)   Resp (!) 26   Ht 5\' 6"  (1.676 m)   Wt 139 lb 12.4 oz (63.4 kg)   SpO2 97%   BMI 20.64 kg/m   HEMODYNAMICS:    VENTILATOR SETTINGS: Vent Mode: PRVC FiO2 (%):  [55 %-100 %] 70 % Set Rate:  [16 bmp] 16 bmp Vt Set:  [510 mL] 510 mL PEEP:  [8 cmH20] 8 cmH20 Plateau Pressure:  [17 cmH20-21 cmH20] 21 cmH20  INTAKE / OUTPUT: I/O last 3 completed shifts: In: 8341 [P.O.:100; I.V.:2775; IV Piggyback:650] Out: 2940 [Urine:2940]  PHYSICAL EXAMINATION: General:  Frail elderly male in NAD,  restless in bed, mitten left hand Neuro: awake,Follows basic commands and answers yes/no questions  HEENT:  [place]/AT, PERRL, no JVD Cardiovascular:  RRR, no MRG Lungs:  Clear, diminished Abdomen:  Soft, non-distended, nontender  Musculoskeletal:  No acute deformity or ROM limitation. Skin:  Grossly intact  LABS:  BMET  Recent Labs Lab 04/15/17 0331 04/16/17 0547 04/17/17 0132  NA 137 139 139  K 3.6 3.4* 3.8  CL 113* 112* 112*  CO2 18* 20* 20*  BUN 22* 15 20  CREATININE 1.06 1.02 1.09  GLUCOSE 103* 77 120*    Electrolytes  Recent Labs Lab 04/14/17 0835 04/15/17 0331 04/16/17 0547 04/17/17 0132  CALCIUM 7.1* 7.1* 7.5* 7.8*  MG 1.8 1.7 1.8  --   PHOS 1.8* 1.9* 2.6 2.4*    CBC  Recent Labs Lab 04/15/17 0331 04/16/17 0547 04/17/17 0132  WBC 14.4* 9.1 5.6  HGB 9.5* 9.3* 10.2*  HCT 28.0* 27.2* 30.4*  PLT 91* 80* 86*    Coag's  Recent  Labs Lab 04/13/17 0109  APTT 31  INR 1.26    Sepsis Markers  Recent Labs Lab 04/12/17 2201 04/13/17 0109 04/13/17 0524  LATICACIDVEN 5.15* 4.6* 3.7*  PROCALCITON  --  106.03  --     ABG  Recent Labs Lab 04/17/17 0235  PHART 7.471*  PCO2ART 24.2*  PO2ART 95.6    Liver Enzymes  Recent Labs Lab 04/12/17 1854 04/13/17 0524  AST 90* 115*  ALT 57 72*  ALKPHOS 103 87  BILITOT 0.6 0.5  ALBUMIN 2.8* 2.4*    Cardiac Enzymes No results for input(s): TROPONINI, PROBNP in the last 168 hours.  Glucose  Recent Labs Lab 04/15/17 1726 04/16/17 0212 04/16/17 0800 04/16/17 1256 04/16/17 1741 04/16/17 2343  GLUCAP 108* 82 73 115* 89 131*    Imaging Dg Chest Port 1 View  Result Date: 04/16/2017 CLINICAL DATA:  Respiratory failure EXAM: PORTABLE CHEST 1 VIEW COMPARISON:  04/12/2017 FINDINGS: Patchy bilateral lower lobe opacities, atelectasis versus pneumonia. Superimposed layering bilateral pleural effusions. Mild frank interstitial edema is suspected. The heart is normal in size. IMPRESSION: Mild interstitial edema with layering bilateral pleural effusion. Underlying patchy bilateral lower lobe opacities, atelectasis versus pneumonia. Electronically Signed   By: Julian Hy M.D.   On: 04/16/2017 23:57  STUDIES:    CULTURES: Blood cultures 6/5 > enterococcus , GNR C-dif 6/5 > antigen positive, toxin neg  ANTIBIOTICS: Cefepime  6/5 >6/7 ceftx 6/7 >> Levaquin 6/5>> 6/5 Vancomycin 6/5 >  SIGNIFICANT EVENTS: 5/17 > 5/21 admit for urinary retention 6/5 > admit for sepsis  LINES/TUBES:   DISCUSSION: 83 year old male with dementia and recent admit for urinary retention  presenting with septic shock-enterococcal + GNR bacteremia, likely source being UTI  c diff  Likely colonisation-prior CT scan had shown proctocolitis   ASSESSMENT / PLAN:  PUlmonary A:  Aspiration event with respiratory failure  P:  Confirmed with daughter that DNR is still  in effect, but Ok with intubation. Code status will be "partial."  Now s/p intubation. Will watch, consider ABX adjustment.  CARDIOVASCULAR A:  Septic shock - resolving  P:  Does not seem to be worsening. Off pressors. DNR    CRITICAL CARE Performed by: Luz Brazen   Total critical care time: 45 minutes  Critical care time was exclusive of separately billable procedures and treating other patients.  Critical care was necessary to treat or prevent imminent or life-threatening deterioration.  Critical care was time spent personally by me on the following activities: development of treatment plan with patient and/or surrogate as well as nursing, discussions with consultants, evaluation of patient's response to treatment, examination of patient, obtaining history from patient or surrogate, ordering and performing treatments and interventions, ordering and review of laboratory studies, ordering and review of radiographic studies, pulse oximetry and re-evaluation of patient's condition.   Luz Brazen, MD 04/17/2017 3:45 AM

## 2017-04-17 NOTE — Significant Event (Signed)
Rapid Response Event Note  Overview: Time Called: 2310 Arrival Time: 2315 Unresponsive, Respiratory Distress  Notified By Ann Held in regards to rapid response call. Patient appears to be unresponsive according to bedside RN. Upon arrival, patient appears to be in respiratory distress.   Initial Focused Assessment: Neuro: Patient was unresponsive, eyes open, patient has left prosthetic eye, right is oval in appearance but reacts to light, 1-2+, patient grunts to sternal rub Cardiac: ST upon telemetry monitor, s1 and s2 sounds present upon ausculation.  Respiratory: Lung sounds diminished and rhonchus upon auscultation, Respirations per minute 30-40.   Interventions: Notified MD Opyd, patient is DNR Notified Family: Daughter MD Karlton Lemon DNR status reversed by daughter, notified Anesthesia per MD Opyd to intubate CCM notified as well Transfer to ICU   Plan of Care (if not transferred):  Event Summary:   at  2330 Notified MD Opyd      at   at 2345 Notified MD [place] with CCM         [place] Souter C

## 2017-04-17 NOTE — Progress Notes (Signed)
PT Cancellation Note  Patient Details Name: Erik Massey MRN: [id number] DOB: 03/05/1929   Cancelled Treatment:    Reason Eval/Treat Not Completed: Patient not medically readyon vent.   Claretha Cooper 04/17/2017, 7:55 AM

## 2017-04-17 NOTE — Progress Notes (Signed)
Assessed pt for PICC placement.  Unable to rotate BUE to position for PICC placement due to contractures.  Recommend CVC or IR place a subclavian /IJ PICC line.  RN aware at bedside.

## 2017-04-18 ENCOUNTER — Inpatient Hospital Stay (HOSPITAL_COMMUNITY): Payer: Medicare Other

## 2017-04-18 DIAGNOSIS — J189 Pneumonia, unspecified organism: Secondary | ICD-10-CM

## 2017-04-18 LAB — GLUCOSE, CAPILLARY
GLUCOSE-CAPILLARY: 127 mg/dL — AB (ref 65–99)
Glucose-Capillary: 103 mg/dL — ABNORMAL HIGH (ref 65–99)
Glucose-Capillary: 124 mg/dL — ABNORMAL HIGH (ref 65–99)
Glucose-Capillary: 137 mg/dL — ABNORMAL HIGH (ref 65–99)

## 2017-04-18 MED ORDER — PRO-STAT SUGAR FREE PO LIQD
30.0000 mL | Freq: Two times a day (BID) | ORAL | Status: DC
  Administered 2017-04-18: 30 mL
  Filled 2017-04-18: qty 30

## 2017-04-18 MED ORDER — ADULT MULTIVITAMIN LIQUID CH
15.0000 mL | Freq: Every day | ORAL | Status: DC
  Administered 2017-04-18 – 2017-04-30 (×13): 15 mL
  Filled 2017-04-18 (×14): qty 15

## 2017-04-18 MED ORDER — VITAL HIGH PROTEIN PO LIQD
1000.0000 mL | ORAL | Status: DC
  Administered 2017-04-18: 1000 mL
  Administered 2017-04-19: 13:00:00
  Filled 2017-04-18 (×2): qty 1000

## 2017-04-18 MED ORDER — VITAL HIGH PROTEIN PO LIQD
1000.0000 mL | ORAL | Status: DC
  Administered 2017-04-18 (×2)
  Administered 2017-04-18: 1000 mL
  Filled 2017-04-18: qty 1000

## 2017-04-18 NOTE — Progress Notes (Signed)
Three phlebotomists have attempted to collect morning labs. Attempts unsuccessful. RN to notify day shift team. Will continue to monitor.

## 2017-04-18 NOTE — Progress Notes (Signed)
OT Cancellation Note  Patient Details Name: Erik Massey MRN: [id number] DOB: November 23, 1928   Cancelled Treatment:    Reason Eval/Treat Not Completed: Other (comment) Patient discharged from OT services secondary to medical decline - will need to re-order OT to resume therapy services.   Mickel Baas [place], [place] 04/18/2017, 11:11 AM

## 2017-04-18 NOTE — Progress Notes (Signed)
Date:  April 18, 2017 Chart reviewed for concurrent status and case management needs. Will continue to follow patient progress. 83 year old male with dementia and recent admit for urinary retention presenting with septic shock-enterococcal + providencia bacteremia, likely source being UTI.  C-diff antigen positive, toxin negative > likely colonization-prior CT scan had shown proctocolitis.  Back to ICU 6/9, intubated after respiratory distress due to presumed aspiration. Discharge Planning: following for needs Expected discharge date: [id number] Velva Harman, BSN, [place], [place]

## 2017-04-18 NOTE — Progress Notes (Signed)
Physical Therapy Discharge Patient Details Name: Erik Massey MRN: [id number] DOB: March 03, 1929 Today's Date: 04/18/2017 Time:  -     Patient discharged from PT services secondary to medical decline - will need to re-order PT to resume therapy services.  [place] [facility] [phone]  [name] 04/18/2017, 7:05 AM

## 2017-04-18 NOTE — Progress Notes (Signed)
Pharmacy Antibiotic Note  Erik Massey is a 83 y.o. male admitted on 04/12/2017 from nursing home with sepsis. Patient was started on vancomycin, levaquin and cefepime on admission.  Providencia stuartii and enterococcus noted in bcx with ID de-escalated abx to vancomycin and ceftriaxone.  Patient went into respiratory distress (6/10) and is now re-intubated. To change ceftriaxone to cefepime/flagyl (pt has PCN allergy) for suspected new aspiration/HCAP.  Today, 04/18/2017: Unable to draw labs or place PICC due to contractures.  Tm 99  Plan:  Continue Cefepime 2g IV q24h  Continue Flagyl 500 mg IV q8h  Continue Vancomycin IV qh.  Measure Vanc trough at steady state.  Follow up renal fxn, culture results, and clinical course.  _____________________________________  Height: 5\' 6"  (167.6 cm) Weight: 139 lb 12.4 oz (63.4 kg) IBW/kg (Calculated) : 63.8  Temp (24hrs), Avg:98.6 F (37 C), Min:98.3 F (36.8 C), Max:99 F (37.2 C)   Recent Labs Lab 04/12/17 1908 04/12/17 2201 04/13/17 0109  04/13/17 0524 04/14/17 0835 04/15/17 0331 04/15/17 1149 04/16/17 0547 04/16/17 1043 04/17/17 0132  WBC  --   --   --   < > 19.7* 16.9* 14.4*  --  9.1  --  5.6  CREATININE  --   --   --   --  1.49* 1.24 1.06  --  1.02  --  1.09  LATICACIDVEN 6.17* 5.15* 4.6*  --  3.7*  --   --   --   --   --   --   VANCOTROUGH  --   --   --   --   --   --   --  19  --  23*  --   < > = values in this interval not displayed.  Estimated Creatinine Clearance: 44.4 mL/min (by C-G formula based on SCr of 1.09 mg/dL).    Allergies  Allergen Reactions  . Penicillins Swelling    Has patient had a PCN reaction causing immediate rash, facial/tongue/throat swelling, SOB or lightheadedness with hypotension: yes Has patient had a PCN reaction causing severe rash involving mucus membranes or skin necrosis: no Has patient had a PCN reaction that required hospitalization- yes already in the hospital Has patient had  a PCN reaction occurring within the last 10 years: No If all of the above answers are "NO", then may proceed with Cephalosporin use.    Antimicrobials this admission:  6/5 cefepime>>6/8>> resume 6/10>> 6/5 vancomycin>> 6/7 CTX>> 6/10 6/5 levofloxacin >> 6/6 6/6 PO vanc >>6/7 6/6 IV flagyl >>6/7>> resume 6/10>>  Dose adjustments this admission:  6/6 increase vancomycin from 500mg  IV q24h to 750mg  q24h for improved SCr 6/7 increase vancomycin 750 mg q12h for improved SCr 6/8 VT at 1149 (per RN) = 19  (on 750 mg q12h) 6/9 VT= 23 (on 750mg  IV q12h) -- reduced to 500 mg q12h 6/11 VT = _____ on Vanc 500 q12h  Microbiology results:  6/5BCx at 1850: 1/2 Providencia stuartii (S= cefe, ceftaz, CTX, imi, zosyn) (nothing on BCID) 6/5 BCx at 2220: 1/2 Enterococcus faecalis (S= amp, vanc) (BCID=enterococcus no resistance) FINAL 6/5 C.Diff Ag/PCR: POSITIVE, Toxin: NEGATIVE (no stools per RN but tx since WBC elevated per MD)  6/6 UCx: multiple species, need recollection FINAL 6/6 MRSA PCR: neg 6/7 bcx x2: 6/9 ucx:    Thank you for allowing pharmacy to be a part of this patient's care.  Gretta Arab PharmD, BCPS Pager [phone] 04/18/2017 10:00 AM

## 2017-04-18 NOTE — Progress Notes (Signed)
SLP Cancellation Note  Patient Details Name: Erik Massey MRN: [id number] DOB: 01-31-1929   Cancelled treatment:       Reason Eval/Treat Not Completed: Medical issues which prohibited therapy. Pt currently orally intubated. ST orders have been cancelled at this time. Please reconsult if needs arise.  [name] [name], [name], [name]  [name] 04/18/2017, 10:34 AM

## 2017-04-18 NOTE — Progress Notes (Signed)
Initial Nutrition Assessment  DOCUMENTATION CODES:   Not applicable  INTERVENTION:  - Will decrease Vital High Protein to 35 mL/hr and d/c Prostat. This regimen + kcal from current IVF will provide 1452 kcal, 74 grams of protein, and 702 mL free water. - Will order 15 mL liquid multivitamin per OGT/day.   NUTRITION DIAGNOSIS:   Inadequate oral intake related to inability to eat as evidenced by NPO status.  GOAL:   Patient will meet greater than or equal to 90% of their needs  MONITOR:   Vent status, TF tolerance, Weight trends, Labs, I & O's  REASON FOR ASSESSMENT:   Ventilator, Consult Enteral/tube feeding initiation and management  ASSESSMENT:   83 y.o. male with history of dementia, hypothyroidism, seizure disorder who was recently admitted for abdominal pain and had CT scan showing left upper quadrant mass. As per the patient's daughter, patient was doing fine until 2 days ago when patient became increasingly lethargic weak and more bed bound. Patient was brought to the occupational getting hypotensive and lethargic and increasing hematuria. Pt intubated early AM 6/10 d/t respiratory failure following aspiration.  Pt seen for new vent and TF consult. BMI indicates normal weight. Pt was previously on Dysphagia 1, nectar-thick diet from 6/8 at 1605 until time of intubation. No family or visitors present at this time. OGT placed at time of intubation yesterday.  Physical assessment shows mild/moderate muscle wasting around clavicle area with no other muscle and no fat wasting; mild edema throughout extremities. Per chart review, weight +2.9 kg from 5/22-6/6. Used weight from 6/6 (59.6 kg) to estimate nutrition needs as weight +3.8 kg since that time.  Patient is currently intubated on ventilator support MV: 8.7 L/min Temp (24hrs), Avg:98.5 F (36.9 C), Min:98.1 F (36.7 C), Max:99 F (37.2 C) Propofol: none BP: 95/47 and MAP: 62  Medications reviewed; 20 mg IV Pepcid BID,  400 mg Mag-ox per OGT/day. Labs reviewed; Cl: 112 mmol/L, Ca: 7.8 mg/dL, Phos: 2.4 mg/dL.  IVF: D10 @ 75 mL/hr (612 kcal from dextrose).    Diet Order:   NPO  Skin:  Reviewed, no issues  Last BM:  6/6  Height:   Ht Readings from Last 1 Encounters:  04/17/17 5\' 6"  (1.676 m)    Weight:   Wt Readings from Last 1 Encounters:  04/14/17 139 lb 12.4 oz (63.4 kg)    Ideal Body Weight:  53.64 kg  BMI:  Body mass index is 20.64 kg/m.  Estimated Nutritional Needs:   Kcal:  1450  Protein:  72-89 grams (1.2-1.5 grams/kg)  Fluid:  >/= 1.4 L/day  EDUCATION NEEDS:   No education needs identified at this time    Jarome Matin, MS, RD, LDN, CNSC Inpatient Clinical Dietitian Pager # [phone] After hours/weekend pager # [phone]

## 2017-04-18 NOTE — Progress Notes (Signed)
PULMONARY / CRITICAL CARE MEDICINE   Name: Erik Massey MRN: [id number] DOB: April 21, 1929    ADMISSION DATE:  04/12/2017 CONSULTATION DATE:  6/6  REFERRING MD:  [name]   CHIEF COMPLAINT:  Shock  BRIEF SUMMARY:    83 year old man with dementia & recent admission 5/17-5/21 for urinary retention due to BPH and cystitis, discharged with indwelling Foley to SNF for rehabilitation. He was readmitted 6/5 with altered mental status, hypotension and lactate of 6 with WBC count of 19.6. He required fluids and Neo-Synephrine drip.  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).  Transferred back to ICU 6/9, intubated for resp distress. Code status reversed by family.  SUBJECTIVE: RN reports no diarrhea, no acute events overnight.  Afebrile.  VSS.  On fentanyl 35mcg. Unable to draw labs this am / or place PICC due to contractures.    VITAL SIGNS: BP (!) 118/54 (BP Location: Right Leg)   Pulse 71   Temp 98.5 F (36.9 C) (Oral)   Resp 16   Ht 5\' 6"  (1.676 m)   Wt 139 lb 12.4 oz (63.4 kg)   SpO2 98%   BMI 20.64 kg/m   HEMODYNAMICS:   VENTILATOR SETTINGS: Vent Mode: PRVC FiO2 (%):  [40 %-50 %] 40 % Set Rate:  [16 bmp] 16 bmp Vt Set:  [510 mL] 510 mL PEEP:  [8 cmH20] 8 cmH20 Plateau Pressure:  [16 cmH20-23 cmH20] 23 cmH20  INTAKE / OUTPUT: I/O last 3 completed shifts: In: 3644.2 [I.V.:2894.2; IV Piggyback:750] Out: 655 [Urine:655]  PHYSICAL EXAMINATION: General: frail elderly male in NAD on vent HEENT: MM pink/moist, ETT Neuro: Sedate, barely opens eyes to voice CV: s1s2 rrr, no m/r/g PULM: even/non-labored, lungs bilaterally coarse RJ:JOAC, non-tender, bsx4 active  Extremities: warm/dry, trace generalized edema, UE contractures Skin: no rashes or lesions  LABS:  BMET  Recent Labs Lab 04/15/17 0331 04/16/17 0547 04/17/17 0132  NA 137 139 139  K 3.6 3.4* 3.8  CL 113* 112* 112*  CO2 18* 20* 20*   BUN 22* 15 20  CREATININE 1.06 1.02 1.09  GLUCOSE 103* 77 120*    Electrolytes  Recent Labs Lab 04/14/17 0835 04/15/17 0331 04/16/17 0547 04/17/17 0132  CALCIUM 7.1* 7.1* 7.5* 7.8*  MG 1.8 1.7 1.8  --   PHOS 1.8* 1.9* 2.6 2.4*    CBC  Recent Labs Lab 04/15/17 0331 04/16/17 0547 04/17/17 0132  WBC 14.4* 9.1 5.6  HGB 9.5* 9.3* 10.2*  HCT 28.0* 27.2* 30.4*  PLT 91* 80* 86*    Coag's  Recent Labs Lab 04/13/17 0109  APTT 31  INR 1.26    Sepsis Markers  Recent Labs Lab 04/12/17 2201 04/13/17 0109 04/13/17 0524  LATICACIDVEN 5.15* 4.6* 3.7*  PROCALCITON  --  106.03  --     ABG  Recent Labs Lab 04/17/17 0235  PHART 7.471*  PCO2ART 24.2*  PO2ART 95.6    Liver Enzymes  Recent Labs Lab 04/12/17 1854 04/13/17 0524  AST 90* 115*  ALT 57 72*  ALKPHOS 103 87  BILITOT 0.6 0.5  ALBUMIN 2.8* 2.4*    Cardiac Enzymes No results for input(s): TROPONINI, PROBNP in the last 168 hours.  Glucose  Recent Labs Lab 04/16/17 1741 04/16/17 2343 04/17/17 0723 04/17/17 1216 04/17/17 2320 04/18/17 0550  GLUCAP 89 131* 103* 120* 123* 103*    Imaging Dg Abd 1 View  Result Date: 04/17/2017 CLINICAL DATA:  NG tube placement EXAM: ABDOMEN -  1 VIEW COMPARISON:  Chest radiograph 04/16/2017 FINDINGS: The tip and side port of the nasogastric tube overlies the gastric body. Left lung base aeration is improved. There are persistent opacities in the right lung base. IMPRESSION: NG tube tip and side port overlying the gastric body. Electronically Signed   By: Ulyses Jarred M.D.   On: 04/17/2017 16:47   Dg Chest Port 1 View  Result Date: 04/18/2017 CLINICAL DATA:  Initial evaluation for acute respiratory failure. EXAM: PORTABLE CHEST 1 VIEW COMPARISON:  Prior radiograph from 04/16/2017. FINDINGS: Patient is intubated with the tip of an endotracheal tube positioned 2.5 cm above the carina. Enteric tube overlies the stomach. Stable cardiomegaly. Mediastinal  silhouette normal. Lungs hypoinflated. Persistent layering bilateral pleural effusions with associated bibasilar opacities, atelectasis and/ or infiltrates. Mild perihilar congestion without overt pulmonary edema. No pneumothorax. No acute osseus abnormality. IMPRESSION: 1. Endotracheal tube in place with tip positioned approximately 2.5 cm above the carina. 2. Persistent layering bilateral pleural effusions with bibasilar opacities, either atelectasis or infiltrates. 3. Perihilar vascular congestion without overt pulmonary edema. Electronically Signed   By: Jeannine Boga M.D.   On: 04/18/2017 05:47   STUDIES:  CXR 6/9 >> worsening bilateral opacities likely pleural effusion with pneumonia.  CULTURES: BCx2 6/5 >> enterococcus faecalis (S-vanco), providencia stuartii (S-cefepime) C-Diff 6/5 >> antigen positive, toxin neg BCx2 6/7 >>  UC 6/9 >> negative   ANTIBIOTICS: Cefepime  6/5 >> 6/7 Ceftx 6/7 >> 6/10 Levaquin 6/5 >> 6/5 Vancomycin 6/5 >> Cefepime 6/6 >>  Metronidazole 6/6 >>   SIGNIFICANT EVENTS: 5/17 > 5/21 admit for urinary retention 6/05  Admit for sepsis 6/09  Rapid response. Intubated 6/10  Maintain on the vent overnight.  Soft BP but not on pressors  LINES/TUBES: ETT 6/9 >>  DISCUSSION: 83 year old male with dementia and recent admit for urinary retention presenting with septic shock-enterococcal + providencia bacteremia, likely source being UTI.  C-diff antigen positive, toxin negative > likely colonization-prior CT scan had shown proctocolitis.  Back to ICU 6/9, intubated after respiratory distress due to presumed aspiration.  ASSESSMENT / PLAN:  RESPIRATORY  A: Acute Hypoxic Respiratory Failure - in setting of suspected aspiration  Possible Aspiration PNA Layering Bilateral Effusions P: PRVC 8 cc/kg Wean PEEP / FiO2 for sats > 92% Intermittent CXR  Daily SBT / WUA  Monitor effusion size  CARDIOVASCULAR A:  Septic Shock - resolved Limited IV  Access P:  ICU monitoring  See ID  LCB > no CPR in the event of arrest  Unable to place PICC, draw labs > daughter ok with pressors but does not want central access  RENAL A:   AKI - resolved Hypophosphatemia BPH - urinary retention with indwelling foley P:   Trend BMP / urinary output Replace electrolytes as indicated Avoid nephrotoxic agents, ensure adequate renal perfusion Maintain foley catheter  Continue proscar, flomax  GASTROINTESTINAL A:   Mild protein calorie malnutrition Dysphagia worse compared to 2 wks ago - due to acute illness P:   Pepcid for for SUP   NPO  OGT  Begin TF per Nutrition   HEMATOLOGIC A:   Anemia of chronic disease  (hgb close to baseline) Thrombocytopenia  P:  Trend CBC SCD's for DVT prophylaxis  Monitor for bleeding   INFECTIOUS A:   Sepsis - secondary to recurrent UTI with indwelling foley.  LLL Opacity on CXR - favor atx C-Diff - favor colonization  Now with new aspiration, HCAP P:   ABX as above  Follow cultures to maturity   ENDOCRINE A:   Hypoglycemia Hypothyroidism  P:   Begin TF, once started d/c D10 infusion. Monitor glucose on BMP  Continue Armour  NEUROLOGIC A:   Acute metabolic encephalopathy Alzheimer's dementia - baseline alert / responsive prior to admit, daughter hopeful to get him back to prior baseline P:   RASS goal: 0 to -1  Fentanyl gtt for pain / sedation   FAMILY  - Updates: No family available on AM rounding.  Updated daughter regarding difficult access situation and she is in agreement that central access is not in his best interest / currently risks outweigh benefits.  Continue supportive medical care otherwise.    CC Time: 30 minutes  Noe Gens, NP-C [facility] Pgr: [phone] or if no answer [phone] 04/18/2017, 8:55 AM

## 2017-04-18 NOTE — Progress Notes (Signed)
Multiple attempts to collect labs throughout the day were unsuccessful by several lab personnel. Patients IV's flush but no blood return noted to attempt labs via that route. MD aware. Luther Parody, RN

## 2017-04-18 NOTE — Progress Notes (Signed)
Patient ID: Erik Massey, male   DOB: 01-05-1929, 83 y.o.   MRN: [id number]          [facility]  Date of Admission:  04/12/2017           Day 6 vancomycin        Day 2 cefepime        Day 2 metronidazole  Principal Problem:   HCAP (healthcare-associated pneumonia) Active Problems:   UTI (urinary tract infection)   Sepsis ([name])   Enterococcal bacteremia   Gram-negative bacteremia   Seizure ([name])   ARF (acute renal failure) (HCC)   Normochromic normocytic anemia   Diarrhea   Dementia   Thrombocytopenia ([facility])   Moderate protein-calorie malnutrition ([name])   Sebaceous cyst   Respiratory failure ([place])   . betaxolol  1 drop Right Eye Daily  . carBAMazepine  200 mg Per Tube QID  . chlorhexidine gluconate (MEDLINE KIT)  15 mL Mouth Rinse BID  . dorzolamide  1 drop Right Eye Q12H  . feeding supplement (PRO-STAT SUGAR FREE 64)  30 mL Per Tube BID  . feeding supplement (VITAL HIGH PROTEIN)  1,000 mL Per Tube Q24H  . finasteride  5 mg Oral Daily  . latanoprost  1 drop Right Eye QHS  . magnesium oxide  400 mg Per Tube Daily  . mouth rinse  15 mL Mouth Rinse QID  . tamsulosin  0.4 mg Oral Daily  . thyroid  90 mg Per Tube Daily   Review of Systems: Review of Systems  Unable to perform ROS: Intubated    Past Medical History:  Diagnosis Date  . Alzheimer disease   . Seizures ([place])     Social History  Substance Use Topics  . Smoking status: Never Smoker  . Smokeless tobacco: Never Used  . Alcohol use No    Family History  Problem Relation Age of Onset  . Family history unknown: Yes   Allergies  Allergen Reactions  . Penicillins Swelling    Has patient had a PCN reaction causing immediate rash, facial/tongue/throat swelling, SOB or lightheadedness with hypotension: yes Has patient had a PCN reaction causing severe rash involving mucus membranes or skin necrosis: no Has patient had a PCN reaction that required hospitalization- yes  already in the hospital Has patient had a PCN reaction occurring within the last 10 years: No If all of the above answers are "NO", then may proceed with Cephalosporin use.     OBJECTIVE: Vitals:   04/18/17 0740 04/18/17 0800 04/18/17 0900 04/18/17 1000  BP:  (!) 118/54 (!) 107/46 (!) 120/55  Pulse:      Resp:  16 15 17   Temp:      TempSrc:      SpO2: 98% 98% 98% 98%  Weight:      Height:       Body mass index is 20.64 kg/m.  Physical Exam  Constitutional:  He has poorly responsive on the ventilator.  Cardiovascular: Normal rate and regular rhythm.   No murmur heard. Pulmonary/Chest: He has no wheezes.  Diffuse rhonchi.  Abdominal: Soft.    Lab Results Lab Results  Component Value Date   WBC 5.6 04/17/2017   HGB 10.2 (L) 04/17/2017   HCT 30.4 (L) 04/17/2017   MCV 87.6 04/17/2017   PLT 86 (L) 04/17/2017    Lab Results  Component Value Date   CREATININE 1.09 04/17/2017   BUN 20 04/17/2017   NA 139 04/17/2017   K  3.8 04/17/2017   CL 112 (H) 04/17/2017   CO2 20 (L) 04/17/2017    Lab Results  Component Value Date   ALT 72 (H) 04/13/2017   AST 115 (H) 04/13/2017   ALKPHOS 87 04/13/2017   BILITOT 0.5 04/13/2017     Microbiology: Recent Results (from the past 240 hour(s))  C difficile quick scan w PCR reflex     Status: Abnormal   Collection Time: 04/12/17  5:33 PM  Result Value Ref Range Status   C Diff antigen POSITIVE (A) NEGATIVE Final   C Diff toxin NEGATIVE NEGATIVE Final   C Diff interpretation Results are indeterminate. See PCR results.  Final  Clostridium Difficile by PCR     Status: Abnormal   Collection Time: 04/12/17  5:33 PM  Result Value Ref Range Status   Toxigenic C Difficile by pcr POSITIVE (A) NEGATIVE Final    Comment: Positive for toxigenic C. difficile with little to no toxin production. Only treat if clinical presentation suggests symptomatic illness.  Blood Culture (routine x 2)     Status: Abnormal   Collection Time: 04/12/17   6:50 PM  Result Value Ref Range Status   Specimen Description BLOOD LEFT HAND  Final   Special Requests IN PEDIATRIC BOTTLE Blood Culture adequate volume  Final   Culture  Setup Time   Final    GRAM NEGATIVE RODS IN PEDIATRIC BOTTLE CRITICAL RESULT CALLED TO, READ BACK BY AND VERIFIED WITH: A. PHAM PHARMD, AT 1518 04/13/17 BY Rush Landmark Performed at [facility], [place] [address] [place], [place] [address]    Culture PROVIDENCIA STUARTII (A)  Final   Report Status 04/15/2017 FINAL  Final   Organism ID, Bacteria PROVIDENCIA STUARTII  Final      Susceptibility   Providencia stuartii - MIC*    AMPICILLIN >=32 RESISTANT Resistant     CEFAZOLIN >=64 RESISTANT Resistant     CEFEPIME <=1 SENSITIVE Sensitive     CEFTAZIDIME <=1 SENSITIVE Sensitive     CEFTRIAXONE <=1 SENSITIVE Sensitive     CIPROFLOXACIN >=4 RESISTANT Resistant     GENTAMICIN RESISTANT Resistant     IMIPENEM 1 SENSITIVE Sensitive     TRIMETH/SULFA >=320 RESISTANT Resistant     AMPICILLIN/SULBACTAM >=32 RESISTANT Resistant     PIP/TAZO <=4 SENSITIVE Sensitive     * PROVIDENCIA STUARTII  Blood Culture ID Panel (Reflexed)     Status: None   Collection Time: 04/12/17  6:50 PM  Result Value Ref Range Status   Enterococcus species NOT DETECTED NOT DETECTED Final   Listeria monocytogenes NOT DETECTED NOT DETECTED Final   Staphylococcus species NOT DETECTED NOT DETECTED Final   Staphylococcus aureus NOT DETECTED NOT DETECTED Final   Streptococcus species NOT DETECTED NOT DETECTED Final   Streptococcus agalactiae NOT DETECTED NOT DETECTED Final   Streptococcus pneumoniae NOT DETECTED NOT DETECTED Final   Streptococcus pyogenes NOT DETECTED NOT DETECTED Final   Acinetobacter baumannii NOT DETECTED NOT DETECTED Final   Enterobacteriaceae species NOT DETECTED NOT DETECTED Final   Enterobacter cloacae complex NOT DETECTED NOT DETECTED Final   Escherichia coli NOT DETECTED NOT DETECTED Final   Klebsiella oxytoca NOT DETECTED  NOT DETECTED Final   Klebsiella pneumoniae NOT DETECTED NOT DETECTED Final   Proteus species NOT DETECTED NOT DETECTED Final   Serratia marcescens NOT DETECTED NOT DETECTED Final   Haemophilus influenzae NOT DETECTED NOT DETECTED Final   Neisseria meningitidis NOT DETECTED NOT DETECTED Final   Pseudomonas aeruginosa NOT DETECTED  NOT DETECTED Final   Candida albicans NOT DETECTED NOT DETECTED Final   Candida glabrata NOT DETECTED NOT DETECTED Final   Candida krusei NOT DETECTED NOT DETECTED Final   Candida parapsilosis NOT DETECTED NOT DETECTED Final   Candida tropicalis NOT DETECTED NOT DETECTED Final    Comment: Performed at [facility], [place] [address], [place], [place] [address]  Blood Culture (routine x 2)     Status: Abnormal   Collection Time: 04/12/17 10:20 PM  Result Value Ref Range Status   Specimen Description BLOOD BLOOD RIGHT FOREARM  Final   Special Requests   Final    BOTTLES DRAWN AEROBIC AND ANAEROBIC Blood Culture adequate volume   Culture  Setup Time   Final    GRAM POSITIVE COCCI IN PAIRS IN BOTH AEROBIC AND ANAEROBIC BOTTLES CRITICAL RESULT CALLED TO, READ BACK BY AND VERIFIED WITH: D. ZIEGLER, RPHARMD ([place]) AT 1800 ON 04/13/17 BY C. JESSUP, MLT. Performed at [facility], [place] [address], [place], [place] [address]    Culture ENTEROCOCCUS FAECALIS (A)  Final   Report Status 04/15/2017 FINAL  Final   Organism ID, Bacteria ENTEROCOCCUS FAECALIS  Final      Susceptibility   Enterococcus faecalis - MIC*    AMPICILLIN <=2 SENSITIVE Sensitive     VANCOMYCIN 4 SENSITIVE Sensitive     GENTAMICIN SYNERGY RESISTANT Resistant     * ENTEROCOCCUS FAECALIS  Blood Culture ID Panel (Reflexed)     Status: Abnormal   Collection Time: 04/12/17 10:20 PM  Result Value Ref Range Status   Enterococcus species DETECTED (A) NOT DETECTED Final    Comment: CRITICAL RESULT CALLED TO, READ BACK BY AND VERIFIED WITH: D. ZIEGLER, RPHARMD AT 1800 ON 04/13/17 BY C. JESSUP, MLT.     Vancomycin resistance NOT DETECTED NOT DETECTED Final   Listeria monocytogenes NOT DETECTED NOT DETECTED Final   Staphylococcus species NOT DETECTED NOT DETECTED Final   Staphylococcus aureus NOT DETECTED NOT DETECTED Final   Streptococcus species NOT DETECTED NOT DETECTED Final   Streptococcus agalactiae NOT DETECTED NOT DETECTED Final   Streptococcus pneumoniae NOT DETECTED NOT DETECTED Final   Streptococcus pyogenes NOT DETECTED NOT DETECTED Final   Acinetobacter baumannii NOT DETECTED NOT DETECTED Final   Enterobacteriaceae species NOT DETECTED NOT DETECTED Final   Enterobacter cloacae complex NOT DETECTED NOT DETECTED Final   Escherichia coli NOT DETECTED NOT DETECTED Final   Klebsiella oxytoca NOT DETECTED NOT DETECTED Final   Klebsiella pneumoniae NOT DETECTED NOT DETECTED Final   Proteus species NOT DETECTED NOT DETECTED Final   Serratia marcescens NOT DETECTED NOT DETECTED Final   Haemophilus influenzae NOT DETECTED NOT DETECTED Final   Neisseria meningitidis NOT DETECTED NOT DETECTED Final   Pseudomonas aeruginosa NOT DETECTED NOT DETECTED Final   Candida albicans NOT DETECTED NOT DETECTED Final   Candida glabrata NOT DETECTED NOT DETECTED Final   Candida krusei NOT DETECTED NOT DETECTED Final   Candida parapsilosis NOT DETECTED NOT DETECTED Final   Candida tropicalis NOT DETECTED NOT DETECTED Final    Comment: Performed at [facility], [place] [address], [place], [place] [address]  MRSA PCR Screening     Status: None   Collection Time: 04/13/17 12:01 AM  Result Value Ref Range Status   MRSA by PCR NEGATIVE NEGATIVE Final    Comment:        The GeneXpert MRSA Assay (FDA approved for NASAL specimens only), is one component of a comprehensive MRSA colonization  surveillance program. It is not intended to diagnose MRSA infection nor to guide or monitor treatment for MRSA infections.   Culture, Urine     Status: Abnormal   Collection Time: 04/13/17  1:37 AM  Result  Value Ref Range Status   Specimen Description URINE, RANDOM  Final   Special Requests NONE  Final   Culture MULTIPLE SPECIES PRESENT, SUGGEST RECOLLECTION (A)  Final   Report Status 04/14/2017 FINAL  Final  Culture, blood (routine x 2)     Status: None (Preliminary result)   Collection Time: 04/14/17  4:52 PM  Result Value Ref Range Status   Specimen Description BLOOD RIGHT HAND  Final   Special Requests   Final    BOTTLES DRAWN AEROBIC AND ANAEROBIC Blood Culture adequate volume   Culture   Final    NO GROWTH 3 DAYS Performed at [facility], [place] [address] [place], [place] [address]    Report Status PENDING  Incomplete  Culture, blood (routine x 2)     Status: None (Preliminary result)   Collection Time: 04/14/17  4:52 PM  Result Value Ref Range Status   Specimen Description BLOOD RIGHT HAND  Final   Special Requests   Final    BOTTLES DRAWN AEROBIC AND ANAEROBIC Blood Culture adequate volume   Culture   Final    NO GROWTH 3 DAYS Performed at [facility], [place] [address], [place], [place] [address]    Report Status PENDING  Incomplete  Urine Culture     Status: None   Collection Time: 04/16/17 12:36 PM  Result Value Ref Range Status   Specimen Description URINE, RANDOM  Final   Special Requests NONE  Final   Culture   Final    NO GROWTH Performed at [facility], [address], [place], [place] [address]    Report Status 04/17/2017 FINAL  Final     ASSESSMENT: He has developed healthcare associated pneumonia while on therapy for urinary tract infection and transient enterococcal and Providencia bacteremia. I will continue vancomycin and cefepime. Metronidazole is not needed for HCAP as anaerobes are not felt to be important pathogens even with "aspiration" HCAP.  PLAN: 1. Continue vancomycin and cefepime 2. Discontinue metronidazole  Michel Bickers, MD [facility] [place] [facility] [phone] pager   [phone] cell 04/18/2017, 11:17 AM

## 2017-04-19 ENCOUNTER — Inpatient Hospital Stay (HOSPITAL_COMMUNITY): Payer: Medicare Other

## 2017-04-19 LAB — CBC
HCT: 24.3 % — ABNORMAL LOW (ref 39.0–52.0)
Hemoglobin: 8.4 g/dL — ABNORMAL LOW (ref 13.0–17.0)
MCH: 29.9 pg (ref 26.0–34.0)
MCHC: 34.6 g/dL (ref 30.0–36.0)
MCV: 86.5 fL (ref 78.0–100.0)
Platelets: 96 10*3/uL — ABNORMAL LOW (ref 150–400)
RBC: 2.81 MIL/uL — ABNORMAL LOW (ref 4.22–5.81)
RDW: 14.7 % (ref 11.5–15.5)
WBC: 17 10*3/uL — ABNORMAL HIGH (ref 4.0–10.5)

## 2017-04-19 LAB — BASIC METABOLIC PANEL
Anion gap: 6 (ref 5–15)
BUN: 17 mg/dL (ref 6–20)
CALCIUM: 7.9 mg/dL — AB (ref 8.9–10.3)
CO2: 22 mmol/L (ref 22–32)
CREATININE: 1.15 mg/dL (ref 0.61–1.24)
Chloride: 106 mmol/L (ref 101–111)
GFR calc Af Amer: >60 mL/min (ref >60)
GFR, EST NON AFRICAN AMERICAN: 56 mL/min — AB (ref >60)
GLUCOSE: 138 mg/dL — AB (ref 65–99)
Potassium: 3.3 mmol/L — ABNORMAL LOW (ref 3.5–5.1)
Sodium: 134 mmol/L — ABNORMAL LOW (ref 135–145)

## 2017-04-19 LAB — GLUCOSE, CAPILLARY
GLUCOSE-CAPILLARY: 128 mg/dL — AB (ref 65–99)
GLUCOSE-CAPILLARY: 145 mg/dL — AB (ref 65–99)
GLUCOSE-CAPILLARY: 109 mg/dL — AB (ref 65–99)
GLUCOSE-CAPILLARY: 123 mg/dL — AB (ref 65–99)
GLUCOSE-CAPILLARY: 103 mg/dL — AB (ref 65–99)
Glucose-Capillary: 139 mg/dL — ABNORMAL HIGH (ref 65–99)

## 2017-04-19 LAB — CULTURE, BLOOD (ROUTINE X 2)
CULTURE: NO GROWTH
Culture: NO GROWTH
SPECIAL REQUESTS: ADEQUATE
Special Requests: ADEQUATE

## 2017-04-19 LAB — VANCOMYCIN, TROUGH: Vancomycin Tr: 22 ug/mL (ref 15–20)

## 2017-04-19 MED ORDER — FENTANYL CITRATE (PF) 100 MCG/2ML IJ SOLN
50.0000 ug | INTRAMUSCULAR | Status: DC | PRN
Start: 2017-04-19 — End: 2017-04-27
  Administered 2017-04-19 – 2017-04-27 (×18): 50 ug via INTRAVENOUS
  Filled 2017-04-19 (×10): qty 2

## 2017-04-19 MED ORDER — FENTANYL CITRATE (PF) 100 MCG/2ML IJ SOLN
50.0000 ug | INTRAMUSCULAR | Status: DC | PRN
  Administered 2017-04-24 (×2): 50 ug via INTRAVENOUS
  Filled 2017-04-19 (×10): qty 2

## 2017-04-19 MED ORDER — SODIUM CHLORIDE 0.9 % IV SOLN
INTRAVENOUS | Status: DC
  Administered 2017-04-19 – 2017-04-20 (×2): via INTRAVENOUS

## 2017-04-19 MED ORDER — POTASSIUM CHLORIDE 20 MEQ/15ML (10%) PO SOLN
40.0000 meq | Freq: Once | ORAL | Status: AC
Start: 2017-04-19 — End: 2017-04-19
  Administered 2017-04-19: 40 meq
  Filled 2017-04-19: qty 30

## 2017-04-19 MED ORDER — OSMOLITE 1.2 CAL PO LIQD
1000.0000 mL | ORAL | Status: DC
  Administered 2017-04-19 – 2017-04-20 (×2): 1000 mL

## 2017-04-19 MED ORDER — PRO-STAT SUGAR FREE PO LIQD
30.0000 mL | Freq: Every day | ORAL | Status: DC
  Administered 2017-04-19 – 2017-04-22 (×3): 30 mL
  Filled 2017-04-19 (×3): qty 30

## 2017-04-19 NOTE — Progress Notes (Signed)
CRITICAL VALUE ALERT  Critical Value:  vanc trough - 22  Date & Time Notied:  13:35, 04/19/17  Provider Notified: Altha Harm with pharmacy notified  Orders Received/Actions taken: no action necessary

## 2017-04-19 NOTE — Progress Notes (Signed)
PULMONARY / CRITICAL CARE MEDICINE   Name: Erik Massey MRN: [id number] DOB: 03/30/1929    ADMISSION DATE:  04/12/2017 CONSULTATION DATE:  6/6  REFERRING MD:  [name]   CHIEF COMPLAINT:  Shock  BRIEF SUMMARY:    83 year old man with dementia & recent admission 5/17-5/21 for urinary retention due to BPH and cystitis, discharged with indwelling Foley to SNF for rehabilitation. He was readmitted 6/5 with altered mental status, hypotension and lactate of 6 with WBC count of 19.6. He required fluids and Neo-Synephrine drip.  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).  Transferred back to ICU 6/9, intubated for resp distress. Code status reversed by family.  SUBJECTIVE:  RN reports lab unable to draw blood > multiple attempts.  Off fentanyl gtt, weaning on 5/5.     VITAL SIGNS: BP (!) 107/53   Pulse 81   Temp 98.6 F (37 C) (Axillary)   Resp 16   Ht 5\' 6"  (1.676 m)   Wt 156 lb 15.5 oz (71.2 kg)   SpO2 95%   BMI 25.34 kg/m   HEMODYNAMICS:   VENTILATOR SETTINGS: Vent Mode: PSV;CPAP FiO2 (%):  [30 %] 30 % Set Rate:  [16 bmp] 16 bmp Vt Set:  [510 mL] 510 mL PEEP:  [5 cmH20-8 cmH20] 5 cmH20 Pressure Support:  [5 cmH20-8 cmH20] 5 cmH20 Plateau Pressure:  [22 cmH20-25 cmH20] 23 cmH20  INTAKE / OUTPUT: I/O last 3 completed shifts: In: 4865.2 [I.V.:3060; Other:180; NG/GT:925.2; IV Piggyback:700] Out: 1390 [Urine:1390]  PHYSICAL EXAMINATION: General:  Frail elderly male in NAD on vent  HEENT: MM pink/moist, ETT Neuro: no response to verbal stimuli, occasional spontaneous movement with stimulation CV: s1s2 rrr, no m/r/g PULM: even/non-labored, lungs bilaterally coarse on L, diminished on R  EV:OJJK, non-tender, bsx4 active  Extremities: warm/dry, 2+ BLE edema  Skin: no rashes or lesions   LABS:  BMET  Recent Labs Lab 04/15/17 0331 04/16/17 0547 04/17/17 0132  NA 137 139 139  K 3.6 3.4*  3.8  CL 113* 112* 112*  CO2 18* 20* 20*  BUN 22* 15 20  CREATININE 1.06 1.02 1.09  GLUCOSE 103* 77 120*    Electrolytes  Recent Labs Lab 04/14/17 0835 04/15/17 0331 04/16/17 0547 04/17/17 0132  CALCIUM 7.1* 7.1* 7.5* 7.8*  MG 1.8 1.7 1.8  --   PHOS 1.8* 1.9* 2.6 2.4*    CBC  Recent Labs Lab 04/15/17 0331 04/16/17 0547 04/17/17 0132  WBC 14.4* 9.1 5.6  HGB 9.5* 9.3* 10.2*  HCT 28.0* 27.2* 30.4*  PLT 91* 80* 86*    Coag's  Recent Labs Lab 04/13/17 0109  APTT 31  INR 1.26    Sepsis Markers  Recent Labs Lab 04/12/17 2201 04/13/17 0109 04/13/17 0524  LATICACIDVEN 5.15* 4.6* 3.7*  PROCALCITON  --  106.03  --     ABG  Recent Labs Lab 04/17/17 0235  PHART 7.471*  PCO2ART 24.2*  PO2ART 95.6    Liver Enzymes  Recent Labs Lab 04/12/17 1854 04/13/17 0524  AST 90* 115*  ALT 57 72*  ALKPHOS 103 87  BILITOT 0.6 0.5  ALBUMIN 2.8* 2.4*    Cardiac Enzymes No results for input(s): TROPONINI, PROBNP in the last 168 hours.  Glucose  Recent Labs Lab 04/18/17 0550 04/18/17 1121 04/18/17 2004 04/18/17 2318 04/19/17 0346 04/19/17 0736  GLUCAP 103* 124* 127* 137* 139* 128*    Imaging Dg Chest Port 1 View  Result Date: 04/19/2017  CLINICAL DATA:  83 year old male respiratory failure. Subsequent encounter. EXAM: PORTABLE CHEST 1 VIEW COMPARISON:  04/18/2017. FINDINGS: Endotracheal tube tip 2.4 cm above the carina. Nasogastric tube tip left upper quadrant of the abdomen at the expected level of the gastric fundus. Patchy consolidation perihilar/ basilar region bilaterally greater on the right. This may represent infectious process versus asymmetric pulmonary edema. Heart size top-normal.  Mildly tortuous aorta. Remote right humeral head fracture. IMPRESSION: Minimal change in appearance of patchy consolidation most notable perihilar and basilar region greater on the right. This may represent infectious process in the proper clinical setting with  asymmetric pulmonary edema a secondary consideration. Electronically Signed   By: Genia Del M.D.   On: 04/19/2017 07:02   STUDIES:  CXR 6/9 >> worsening bilateral opacities likely pleural effusion with pneumonia.  CULTURES: BCx2 6/5 >> enterococcus faecalis (S-vanco), providencia stuartii (S-cefepime) C-Diff 6/5 >> antigen positive, toxin neg BCx2 6/7 >>  UC 6/9 >> negative   ANTIBIOTICS: Cefepime  6/5 >> 6/7 Ceftx 6/7 >> 6/10 Levaquin 6/5 >> 6/5 Vancomycin 6/5 >> Cefepime 6/6 >>  Metronidazole 6/6 >>   SIGNIFICANT EVENTS: 5/17 > 5/21 admit for urinary retention 6/05  Admit for sepsis 6/09  Rapid response. Intubated 6/10  Maintain on the vent overnight.  Soft BP but not on pressors 6/12  Weaning on PSV 5/5  LINES/TUBES: ETT 6/9 >>  DISCUSSION: 83 year old male with dementia and recent admit for urinary retention presenting with septic shock-enterococcal + providencia bacteremia, likely source being UTI.  C-diff antigen positive, toxin negative > likely colonization-prior CT scan had shown proctocolitis.  Back to ICU 6/9, intubated after respiratory distress due to presumed aspiration.  ASSESSMENT / PLAN:  RESPIRATORY  A: Acute Hypoxic Respiratory Failure - in setting of suspected aspiration  Possible Aspiration PNA Layering Bilateral Effusions P: PRVC 8 cc/kg Wean PEEP / FiO2 for sats > 92% Intermittent CXR Daily SBT / WUA  Monitor effusions   CARDIOVASCULAR A:  Septic Shock - resolved Limited IV Access P:  ICU monitoring  See ID  LCB > no CPR in the event of arrest  Unable to place PICC, unable to draw labs > daughter ok with peripheral pressors but does not want central access   RENAL A:   AKI - resolved Hypophosphatemia BPH - urinary retention with indwelling foley P:   Trend BMP / urinary output Replace electrolytes as indicated Avoid nephrotoxic agents, ensure adequate renal perfusion Maintain foley catheter  Continue proscar,  flomax  GASTROINTESTINAL A:   Mild protein calorie malnutrition Dysphagia worse compared to 2 wks ago - due to acute illness P:   Pepcid for SUP  NPO OGT TF per Nutrition  HEMATOLOGIC A:   Anemia of chronic disease  (hgb close to baseline) Thrombocytopenia  P:  Trend CBC  SCD's for DVT prophylaxis  Monitor for bleeding   INFECTIOUS A:   Sepsis - secondary to recurrent UTI with indwelling foley.  LLL Opacity on CXR - favor atx C-Diff - favor colonization  Now with new aspiration, HCAP P:   ABX as above, D8/x  Consider d/c abx, will discuss with Attending MD Follow cultures to maturity    ENDOCRINE A:   Hypoglycemia Hypothyroidism  P:   TF  Monitor glucose on BMP  Continue Armour   NEUROLOGIC A:   Acute metabolic encephalopathy Alzheimer's dementia - baseline alert / responsive prior to admit, daughter hopeful to get him back to prior baseline P:   RASS goal: 0  to -1  Fentanyl PRN for pain / sedation     FAMILY  - Updates: Daughter updated via phone 6/11 > agrees no central access.      CC Time: 30 minutes   Noe Gens, NP-C [facility] Pgr: [phone] or if no answer [phone] 04/19/2017, 8:46 AM

## 2017-04-19 NOTE — Progress Notes (Signed)
Patient ID: Erik Massey, male   DOB: [birth year], 83 y.o.   MRN: [id number]          [facility]  Date of Admission:  04/12/2017           Day 7 vancomycin        Day 3 cefepime         Principal Problem:   HCAP (healthcare-associated pneumonia) Active Problems:   UTI (urinary tract infection)   Sepsis ([place])   Enterococcal bacteremia   Gram-negative bacteremia   Seizure (HCC)   ARF (acute renal failure) (HCC)   Normochromic normocytic anemia   Diarrhea   Dementia   Thrombocytopenia (HCC)   Moderate protein-calorie malnutrition ([place])   Sebaceous cyst   Respiratory failure ([place])   . betaxolol  1 drop Right Eye Daily  . carBAMazepine  200 mg Per Tube QID  . chlorhexidine gluconate (MEDLINE KIT)  15 mL Mouth Rinse BID  . dorzolamide  1 drop Right Eye Q12H  . feeding supplement (PRO-STAT SUGAR FREE 64)  30 mL Per Tube Daily  . finasteride  5 mg Oral Daily  . latanoprost  1 drop Right Eye QHS  . magnesium oxide  400 mg Per Tube Daily  . mouth rinse  15 mL Mouth Rinse QID  . multivitamin  15 mL Per Tube Daily  . tamsulosin  0.4 mg Oral Daily  . thyroid  90 mg Per Tube Daily   Review of Systems: Review of Systems  Unable to perform ROS: Intubated    Past Medical History:  Diagnosis Date  . Alzheimer disease   . Seizures ([place])     Social History  Substance Use Topics  . Smoking status: Never Smoker  . Smokeless tobacco: Never Used  . Alcohol use No    Family History  Problem Relation Age of Onset  . Family history unknown: Yes   Allergies  Allergen Reactions  . Penicillins Swelling    Has patient had a PCN reaction causing immediate rash, facial/tongue/throat swelling, SOB or lightheadedness with hypotension: yes Has patient had a PCN reaction causing severe rash involving mucus membranes or skin necrosis: no Has patient had a PCN reaction that required hospitalization- yes already in the hospital Has patient had a PCN  reaction occurring within the last 10 years: No If all of the above answers are "NO", then may proceed with Cephalosporin use.     OBJECTIVE: Vitals:   04/19/17 1200 04/19/17 1241 04/19/17 1300 04/19/17 1400  BP: (!) 105/54  126/64 (!) 112/55  Pulse:      Resp: 19  19 20   Temp:      TempSrc:      SpO2: 96% 98% 96% 97%  Weight:      Height:       Body mass index is 25.34 kg/m.  Physical Exam  Constitutional:  He remains poorly responsive on the ventilator.  Cardiovascular: Normal rate and regular rhythm.   No murmur heard. Pulmonary/Chest: He has no wheezes.  Diminished breath sounds on the right.  Abdominal: Soft.    Lab Results Lab Results  Component Value Date   WBC 17.0 (H) 04/19/2017   HGB 8.4 (L) 04/19/2017   HCT 24.3 (L) 04/19/2017   MCV 86.5 04/19/2017   PLT 96 (L) 04/19/2017    Lab Results  Component Value Date   CREATININE 1.15 04/19/2017   BUN 17 04/19/2017   NA 134 (L) 04/19/2017   K 3.3 (L)  04/19/2017   CL 106 04/19/2017   CO2 22 04/19/2017    Lab Results  Component Value Date   ALT 72 (H) 04/13/2017   AST 115 (H) 04/13/2017   ALKPHOS 87 04/13/2017   BILITOT 0.5 04/13/2017     Microbiology: Recent Results (from the past 240 hour(s))  C difficile quick scan w PCR reflex     Status: Abnormal   Collection Time: 04/12/17  5:33 PM  Result Value Ref Range Status   C Diff antigen POSITIVE (A) NEGATIVE Final   C Diff toxin NEGATIVE NEGATIVE Final   C Diff interpretation Results are indeterminate. See PCR results.  Final  Clostridium Difficile by PCR     Status: Abnormal   Collection Time: 04/12/17  5:33 PM  Result Value Ref Range Status   Toxigenic C Difficile by pcr POSITIVE (A) NEGATIVE Final    Comment: Positive for toxigenic C. difficile with little to no toxin production. Only treat if clinical presentation suggests symptomatic illness.  Blood Culture (routine x 2)     Status: Abnormal   Collection Time: 04/12/17  6:50 PM  Result Value  Ref Range Status   Specimen Description BLOOD LEFT HAND  Final   Special Requests IN PEDIATRIC BOTTLE Blood Culture adequate volume  Final   Culture  Setup Time   Final    GRAM NEGATIVE RODS IN PEDIATRIC BOTTLE CRITICAL RESULT CALLED TO, READ BACK BY AND VERIFIED WITH: A. PHAM PHARMD, AT 1518 04/13/17 BY Rush Landmark Performed at [facility], [place] [address] [place], [place] [address]    Culture PROVIDENCIA STUARTII (A)  Final   Report Status 04/15/2017 FINAL  Final   Organism ID, Bacteria PROVIDENCIA STUARTII  Final      Susceptibility   Providencia stuartii - MIC*    AMPICILLIN >=32 RESISTANT Resistant     CEFAZOLIN >=64 RESISTANT Resistant     CEFEPIME <=1 SENSITIVE Sensitive     CEFTAZIDIME <=1 SENSITIVE Sensitive     CEFTRIAXONE <=1 SENSITIVE Sensitive     CIPROFLOXACIN >=4 RESISTANT Resistant     GENTAMICIN RESISTANT Resistant     IMIPENEM 1 SENSITIVE Sensitive     TRIMETH/SULFA >=320 RESISTANT Resistant     AMPICILLIN/SULBACTAM >=32 RESISTANT Resistant     PIP/TAZO <=4 SENSITIVE Sensitive     * PROVIDENCIA STUARTII  Blood Culture ID Panel (Reflexed)     Status: None   Collection Time: 04/12/17  6:50 PM  Result Value Ref Range Status   Enterococcus species NOT DETECTED NOT DETECTED Final   Listeria monocytogenes NOT DETECTED NOT DETECTED Final   Staphylococcus species NOT DETECTED NOT DETECTED Final   Staphylococcus aureus NOT DETECTED NOT DETECTED Final   Streptococcus species NOT DETECTED NOT DETECTED Final   Streptococcus agalactiae NOT DETECTED NOT DETECTED Final   Streptococcus pneumoniae NOT DETECTED NOT DETECTED Final   Streptococcus pyogenes NOT DETECTED NOT DETECTED Final   Acinetobacter baumannii NOT DETECTED NOT DETECTED Final   Enterobacteriaceae species NOT DETECTED NOT DETECTED Final   Enterobacter cloacae complex NOT DETECTED NOT DETECTED Final   Escherichia coli NOT DETECTED NOT DETECTED Final   Klebsiella oxytoca NOT DETECTED NOT DETECTED Final    Klebsiella pneumoniae NOT DETECTED NOT DETECTED Final   Proteus species NOT DETECTED NOT DETECTED Final   Serratia marcescens NOT DETECTED NOT DETECTED Final   Haemophilus influenzae NOT DETECTED NOT DETECTED Final   Neisseria meningitidis NOT DETECTED NOT DETECTED Final   Pseudomonas aeruginosa NOT DETECTED NOT DETECTED Final  Candida albicans NOT DETECTED NOT DETECTED Final   Candida glabrata NOT DETECTED NOT DETECTED Final   Candida krusei NOT DETECTED NOT DETECTED Final   Candida parapsilosis NOT DETECTED NOT DETECTED Final   Candida tropicalis NOT DETECTED NOT DETECTED Final    Comment: Performed at [facility], [place] [address] [place], [place] [address]  Blood Culture (routine x 2)     Status: Abnormal   Collection Time: 04/12/17 10:20 PM  Result Value Ref Range Status   Specimen Description BLOOD BLOOD RIGHT FOREARM  Final   Special Requests   Final    BOTTLES DRAWN AEROBIC AND ANAEROBIC Blood Culture adequate volume   Culture  Setup Time   Final    GRAM POSITIVE COCCI IN PAIRS IN BOTH AEROBIC AND ANAEROBIC BOTTLES CRITICAL RESULT CALLED TO, READ BACK BY AND VERIFIED WITH: D. ZIEGLER, RPHARMD ([facility]) AT 1800 ON 04/13/17 BY C. JESSUP, MLT. Performed at [facility], [place] [address] [place], [place] [address]    Culture ENTEROCOCCUS FAECALIS (A)  Final   Report Status 04/15/2017 FINAL  Final   Organism ID, Bacteria ENTEROCOCCUS FAECALIS  Final      Susceptibility   Enterococcus faecalis - MIC*    AMPICILLIN <=2 SENSITIVE Sensitive     VANCOMYCIN 4 SENSITIVE Sensitive     GENTAMICIN SYNERGY RESISTANT Resistant     * ENTEROCOCCUS FAECALIS  Blood Culture ID Panel (Reflexed)     Status: Abnormal   Collection Time: 04/12/17 10:20 PM  Result Value Ref Range Status   Enterococcus species DETECTED (A) NOT DETECTED Final    Comment: CRITICAL RESULT CALLED TO, READ BACK BY AND VERIFIED WITH: D. ZIEGLER, RPHARMD AT 1800 ON 04/13/17 BY C. JESSUP, MLT.    Vancomycin resistance  NOT DETECTED NOT DETECTED Final   Listeria monocytogenes NOT DETECTED NOT DETECTED Final   Staphylococcus species NOT DETECTED NOT DETECTED Final   Staphylococcus aureus NOT DETECTED NOT DETECTED Final   Streptococcus species NOT DETECTED NOT DETECTED Final   Streptococcus agalactiae NOT DETECTED NOT DETECTED Final   Streptococcus pneumoniae NOT DETECTED NOT DETECTED Final   Streptococcus pyogenes NOT DETECTED NOT DETECTED Final   Acinetobacter baumannii NOT DETECTED NOT DETECTED Final   Enterobacteriaceae species NOT DETECTED NOT DETECTED Final   Enterobacter cloacae complex NOT DETECTED NOT DETECTED Final   Escherichia coli NOT DETECTED NOT DETECTED Final   Klebsiella oxytoca NOT DETECTED NOT DETECTED Final   Klebsiella pneumoniae NOT DETECTED NOT DETECTED Final   Proteus species NOT DETECTED NOT DETECTED Final   Serratia marcescens NOT DETECTED NOT DETECTED Final   Haemophilus influenzae NOT DETECTED NOT DETECTED Final   Neisseria meningitidis NOT DETECTED NOT DETECTED Final   Pseudomonas aeruginosa NOT DETECTED NOT DETECTED Final   Candida albicans NOT DETECTED NOT DETECTED Final   Candida glabrata NOT DETECTED NOT DETECTED Final   Candida krusei NOT DETECTED NOT DETECTED Final   Candida parapsilosis NOT DETECTED NOT DETECTED Final   Candida tropicalis NOT DETECTED NOT DETECTED Final    Comment: Performed at [facility], [place] [address], [place], [place] [address]  MRSA PCR Screening     Status: None   Collection Time: 04/13/17 12:01 AM  Result Value Ref Range Status   MRSA by PCR NEGATIVE NEGATIVE Final    Comment:        The GeneXpert MRSA Assay (FDA approved for NASAL specimens only), is one component of a comprehensive MRSA colonization surveillance program. It is not  intended to diagnose MRSA infection nor to guide or monitor treatment for MRSA infections.   Culture, Urine     Status: Abnormal   Collection Time: 04/13/17  1:37 AM  Result Value Ref Range Status    Specimen Description URINE, RANDOM  Final   Special Requests NONE  Final   Culture MULTIPLE SPECIES PRESENT, SUGGEST RECOLLECTION (A)  Final   Report Status 04/14/2017 FINAL  Final  Culture, blood (routine x 2)     Status: None   Collection Time: 04/14/17  4:52 PM  Result Value Ref Range Status   Specimen Description BLOOD RIGHT HAND  Final   Special Requests   Final    BOTTLES DRAWN AEROBIC AND ANAEROBIC Blood Culture adequate volume   Culture   Final    NO GROWTH 5 DAYS Performed at [facility], [place] [address], [place], [place] [address]    Report Status 04/19/2017 FINAL  Final  Culture, blood (routine x 2)     Status: None   Collection Time: 04/14/17  4:52 PM  Result Value Ref Range Status   Specimen Description BLOOD RIGHT HAND  Final   Special Requests   Final    BOTTLES DRAWN AEROBIC AND ANAEROBIC Blood Culture adequate volume   Culture   Final    NO GROWTH 5 DAYS Performed at [facility], [place] [address], [place], [place] [address]    Report Status 04/19/2017 FINAL  Final  Urine Culture     Status: None   Collection Time: 04/16/17 12:36 PM  Result Value Ref Range Status   Specimen Description URINE, RANDOM  Final   Special Requests NONE  Final   Culture   Final    NO GROWTH Performed at [facility], [place] [address], [place], [place] [address]    Report Status 04/17/2017 FINAL  Final     ASSESSMENT: He has developed healthcare associated pneumonia while on therapy for urinary tract infection and transient enterococcal and Providencia bacteremia. I will continue vancomycin and cefepime. I will continue antibiotics for another 3-4 days.  PLAN: 1. Continue vancomycin and cefepime  Michel Bickers, MD [facility] [phone] pager   [phone] cell 04/19/2017, 3:02 PM

## 2017-04-19 NOTE — Progress Notes (Signed)
Pharmacy Antibiotic Note  Erik Massey is a 83 y.o. male admitted on 04/12/2017 from nursing home with sepsis. Patient was started on vancomycin, levaquin and cefepime on admission.  Providencia stuartii and enterococcus noted in bcx with ID de-escalated abx to vancomycin and ceftriaxone.  Patient went into respiratory distress (6/10) and is now re-intubated. Pharmacy is currently consulted to dose Vancomycin and Cefepime for UTI, Bacteremia, and HCAP.    Today, 04/19/2017: Unable to draw AM labs on 6/11 or 6/12 and unable to place PICC due to contractures.  PCCM able to obtain blood for labs from external jugular using butterfly needle. Tm 99 Scr trending up from 1.09 > 1.15 with CrCl ~ 42 ml/min WBC increased, 5.6 > 17  Plan:  Continue Cefepime 2g IV q24h  Continue Vancomycin 500 mg IV q12h.  Follow up renal fxn, culture results, and clinical course.  _____________________________________  Height: 5\' 6"  (167.6 cm) Weight: 156 lb 15.5 oz (71.2 kg) IBW/kg (Calculated) : 63.8  Temp (24hrs), Avg:98.5 F (36.9 C), Min:98.1 F (36.7 C), Max:99 F (37.2 C)   Recent Labs Lab 04/12/17 1908 04/12/17 2201 04/13/17 0109  04/13/17 0524 04/14/17 0835 04/15/17 0331 04/15/17 1149 04/16/17 0547 04/16/17 1043 04/17/17 0132  WBC  --   --   --   < > 19.7* 16.9* 14.4*  --  9.1  --  5.6  CREATININE  --   --   --   --  1.49* 1.24 1.06  --  1.02  --  1.09  LATICACIDVEN 6.17* 5.15* 4.6*  --  3.7*  --   --   --   --   --   --   VANCOTROUGH  --   --   --   --   --   --   --  19  --  23*  --   < > = values in this interval not displayed.  Estimated Creatinine Clearance: 44.7 mL/min (by C-G formula based on SCr of 1.09 mg/dL).    Allergies  Allergen Reactions  . Penicillins Swelling    Has patient had a PCN reaction causing immediate rash, facial/tongue/throat swelling, SOB or lightheadedness with hypotension: yes Has patient had a PCN reaction causing severe rash involving mucus  membranes or skin necrosis: no Has patient had a PCN reaction that required hospitalization- yes already in the hospital Has patient had a PCN reaction occurring within the last 10 years: No If all of the above answers are "NO", then may proceed with Cephalosporin use.    Antimicrobials this admission:  6/5 cefepime>>6/8>> resume 6/10>> 6/5 vancomycin>> 6/7 CTX>> 6/10 6/5 levofloxacin >> 6/6 6/6 PO vanc >>6/7 6/6 IV flagyl >>6/7>> resume 6/10>> 6/11  Dose adjustments this admission:  6/6 increase vancomycin from 500mg  IV q24h to 750mg  q24h for improved SCr 6/7 increase vancomycin 750 mg q12h for improved SCr 6/8 VT at 1149 (per RN) = 19  (on 750 mg q12h) 6/9 VT= 23 (on 750mg  IV q12h) -- reduced to 500 mg q12h 6/11 VT = (canceled, unable to obtain blood for labs) 6/12 1130 VT = 22.  Drawn ~4 hours early (last dose at 0330 with next dose due at 1600) d/t inability to obtain blood by lab and only PCCM able to get labs.  Extrapolated true trough level ~ 18.5.  Continue same dose.   Microbiology results:  6/5BCx at 1850: 1/2 Providencia stuartii (S= cefe, ceftaz, CTX, imi, zosyn) (nothing on BCID) 6/5 BCx at 2220: 1/2 Enterococcus faecalis (  S= amp, vanc) (BCID=enterococcus no resistance) FINAL 6/5 C.Diff Ag/PCR: POSITIVE, Toxin: NEGATIVE (no stools per RN but tx since WBC elevated per MD)  6/6 UCx: multiple species, need recollection FINAL 6/6 MRSA PCR: neg 6/7 bcx x2: ngtd 6/9 ucx: NGF   Thank you for allowing pharmacy to be a part of this patient's care.  Gretta Arab PharmD, BCPS Pager [phone] 04/19/2017 2:23 PM

## 2017-04-19 NOTE — Progress Notes (Signed)
Nutrition Follow-up  DOCUMENTATION CODES:   Not applicable  INTERVENTION:  - Will order 30 mL Prostat once/day and Osmolite 1.2 @ 30 mL/hr to increase by 10 mL every 8 hours to reach goal rate of Osmolite 1.2 @ 50 mL/hr. At goal rate, this regimen will provide 1540 kcal (103% estimated kcal need), 82 grams of protein, and 984 mL free water. - Will monitor for BM, abdominal distention.   NUTRITION DIAGNOSIS:   Inadequate oral intake related to inability to eat as evidenced by NPO status. -ongoing  GOAL:   Patient will meet greater than or equal to 90% of their needs -will be met with TF order.  MONITOR:   Vent status, TF tolerance, Weight trends, Labs, I & O's  ASSESSMENT:   83 y.o. male with history of dementia, hypothyroidism, seizure disorder who was recently admitted for abdominal pain and had CT scan showing left upper quadrant mass. As per the patient's daughter, patient was doing fine until 2 days ago when patient became increasingly lethargic weak and more bed bound. Patient was brought to the occupational getting hypotensive and lethargic and increasing hematuria. Pt intubated early AM 6/10 d/t respiratory failure following aspiration.  6/12 Pt remains intubated with OGT in place and receiving Vital High Protein @ 35 mL/hr which is providing 840 kcal, 74 grams of protein, and 702 mL free water. D10 @ 75 mL/hr providing an additional 612 kcal. Spoke with Brandi, PCCM NP, who states D10 to be d/c'ed today. Will adjust TF regimen as outlined above to continue meeting estimated nutrition needs. Spoke with RN who reports that pt has been unarousable despite d/c of Fentanyl. Per Dr. Matilde Bash note this AM: Plan is to extubate if mental status improves over the next few days. We will need to clarify if the family wants reintubation if he fails extubation. Pt with aspiration leading to HCAP. Weight + 1.3 kg from yesterday; continue to use weight from admission (59.6 kg) in re-estimating  kcal need this AM. Noted no BM since 04/13/17.   Patient is currently intubated on ventilator support MV: 10.2 L/min Temp (24hrs), Avg:98.5 F (36.9 C), Min:98.1 F (36.7 C), Max:99 F (37.2 C) BP: 116/56 and MAP: 76  Medications reviewed; 20 mg IV Pepcid BID, 400 mg Mag-ox per OGT/day, 15 mL liquid multivitamin per OGT/day. Labs reviewed; CBGs: 129 and 139 mg/dL this AM.  IVF: NS @ 50 mL/hr (1200 mL/day).   6/11 - Pt was previously on Dysphagia 1, nectar-thick diet from 6/8 at 1605 until time of intubation.  - No family or visitors present at this time.  -OGT placed at time of intubation yesterday. - Physical assessment shows mild/moderate muscle wasting around clavicle area with no other muscle and no fat wasting; mild edema throughout extremities.  - Per chart review, weight +2.9 kg from 5/22-6/6. - Used weight from 6/6 (59.6 kg) to estimate nutrition needs as weight +3.8 kg since that time.  Patient is currently intubated on ventilator support MV: 8.7 L/min Temp (24hrs), Avg:98.5 F (36.9 C), Min:98.1 F (36.7 C), Max:99 F (37.2 C) Propofol: none BP: 95/47 and MAP: 62 IVF: D10 @ 75 mL/hr (612 kcal from dextrose).    Diet Order:   NPO  Skin:  Reviewed, no issues  Last BM:  6/6  Height:   Ht Readings from Last 1 Encounters:  04/18/17 5' 6"  (1.676 m)    Weight:   Wt Readings from Last 1 Encounters:  04/19/17 156 lb 15.5 oz (71.2 kg)  Ideal Body Weight:  53.64 kg  BMI:  Body mass index is 25.34 kg/m.  Estimated Nutritional Needs:   Kcal:  1500  Protein:  72-89 grams (1.2-1.5 grams/kg)  Fluid:  >/= 1.4 L/day  EDUCATION NEEDS:   No education needs identified at this time    Jarome Matin, MS, RD, LDN, CNSC Inpatient Clinical Dietitian Pager # [phone] After hours/weekend pager # [phone]

## 2017-04-19 NOTE — Progress Notes (Signed)
Correction to prior documentation from 6/12 > pt will remain on abx for enterococcus & providencia bacteremia.     Daily labs drawn via external jugular with butterfly needle.  Unable to draw otherwise and no labs in 48 hours.     Noe Gens, NP-C [facility] Pgr: [phone] or if no answer [phone] 04/19/2017, 11:39 AM

## 2017-04-20 ENCOUNTER — Inpatient Hospital Stay (HOSPITAL_COMMUNITY): Payer: Medicare Other

## 2017-04-20 LAB — GLUCOSE, CAPILLARY
GLUCOSE-CAPILLARY: 125 mg/dL — AB (ref 65–99)
GLUCOSE-CAPILLARY: 95 mg/dL (ref 65–99)
GLUCOSE-CAPILLARY: 95 mg/dL (ref 65–99)
GLUCOSE-CAPILLARY: 83 mg/dL (ref 65–99)
Glucose-Capillary: 100 mg/dL — ABNORMAL HIGH (ref 65–99)

## 2017-04-20 MED ORDER — DEXTROSE-NACL 5-0.9 % IV SOLN
INTRAVENOUS | Status: DC
Start: 2017-04-20 — End: 2017-04-25
  Administered 2017-04-20: 23:00:00 via INTRAVENOUS
  Administered 2017-04-22: 50 mL/h via INTRAVENOUS
  Administered 2017-04-23 – 2017-04-24 (×3): via INTRAVENOUS

## 2017-04-20 MED ORDER — POTASSIUM CHLORIDE 20 MEQ/15ML (10%) PO SOLN
20.0000 meq | Freq: Once | ORAL | Status: AC
  Administered 2017-04-20: 20 meq
  Filled 2017-04-20: qty 15

## 2017-04-20 NOTE — Progress Notes (Signed)
Patient daughter, Braylan Faul called and request that tube feeding be stopped because she thinks this could be a possible cause to some of the issues her father is having. Tube feeding stopped per family request, Dr. Vaughan Browner (PCCM rounding physician) informed. Order given by MD to stop tube feeds per family request.

## 2017-04-20 NOTE — Progress Notes (Signed)
Nutrition Follow-up  DOCUMENTATION CODES:   Not applicable  INTERVENTION:  - Will continue Osmolite 1.2 @ 50 mL/hr with 30 mL Prostat once/day which meets 87% estimated kcal and 100% estimated protein needs; will not increase d/c no BM x7 days.  - Will continue to monitor POC/GOC.  NUTRITION DIAGNOSIS:   Inadequate oral intake related to inability to eat as evidenced by NPO status. -ongoing  GOAL:   Patient will meet greater than or equal to 90% of their needs -met with current TF regimen  MONITOR:   Vent status, TF tolerance, Weight trends, Labs, I & O's  ASSESSMENT:   83 y.o. male with history of dementia, hypothyroidism, seizure disorder who was recently admitted for abdominal pain and had CT scan showing left upper quadrant mass. As per the patient's daughter, patient was doing fine until 2 days ago when patient became increasingly lethargic weak and more bed bound. Patient was brought to the occupational getting hypotensive and lethargic and increasing hematuria. Pt intubated early AM 6/10 d/t respiratory failure following aspiration.  6/13 Pt remains intubated with OGT in place and is receiving Osmolite 1.2 @ 50 mL/hr with 30 mL Prostat once/day and 30 mL free water QID. This regimen is providing 1540 kcal (87% re-estimated kcal need), 82 grams of protein, and 1104 mL free water. Weight +0.9 kg from yesterday and doc flowsheet indicates moderate-very deep edema found in areas throughout the body. Spoke with RN who reports pt mainly unchanged since yesterday, remains unarousable.   Per PCCM NP note this AM, unable to place PICC, avoid nephrotoxic agents, ensure adequate renal perfusion, OGT advanced 5 cm this AM. Still no BM since 6/6.  Patient is currently intubated on ventilator support MV: 12.3 L/min Temp (24hrs), Avg:100.1 F (37.8 C), Min:99.2 F (37.3 C), Max:101.2 F (38.4 C) BP: 145/51 and MAP: 86  Medications reviewed; 20 mg IV Pepcid BID, 40 mg Mag-ox per  OGT/day, 15 mL liquid multivitamin/day, 20 mEq KCl per OGT x1 dose today, 40 mEq KCl per OGT x1 dose yesterday.  Labs reviewed; CBGs: 126 and 95 mg/dL this AM, Na: 134 mmol/L, K: 3.3 mmol/L, Ca: 7.9 mg/dL.  IVF: NS @ 50 mL/hr.    6/12 - OGT in place and receiving Vital High Protein @ 35 mL/hr which is providing 840 kcal, 74 grams of protein, and 702 mL free water.  - D10 @ 75 mL/hr providing an additional 612 kcal.  - Spoke with PCCM NP who states D10 to be d/c'ed today.  - Spoke with RN who reports that pt has been unarousable despite d/c of Fentanyl.  - Per Dr. Matilde Bash note this AM: Plan is to extubate if mental status improves over the next few days. We will need to clarify if the family wants reintubation if he fails extubation.  - Pt with aspiration leading to HCAP.  - Weight + 1.3 kg from yesterday; continue to use weight from admission (59.6 kg) in re-estimating kcal need this AM.  - Noted no BM since 04/13/17.   Patient is currently intubated on ventilator support MV: 10.2 L/min Temp (24hrs), Avg:98.5 F (36.9 C), Min:98.1 F (36.7 C), Max:99 F (37.2 C) BP: 116/56 and MAP: 76 IVF: NS @ 50 mL/hr (1200 mL/day).   6/11 - Pt was previously on Dysphagia 1, nectar-thick diet from 6/8 at 1605 until time of intubation.  - No family or visitors present at this time.  -OGT placed at time of intubation yesterday. - Physical assessment shows mild/moderate muscle  wasting around clavicle area with no other muscle and no fat wasting; mild edema throughout extremities.  - Per chart review, weight +2.9 kg from 5/22-6/6. - Used weight from 6/6 (59.6 kg) to estimate nutrition needs as weight +3.8 kg since that time.  Patient is currently intubated on ventilator support MV: 8.7L/min Temp (24hrs), Avg:98.5 F (36.9 C), Min:98.1 F (36.7 C), Max:99 F (37.2 C) Propofol: none BP: 95/47 and MAP: 62 IVF:D10 @ 75 mL/hr (612 kcal from dextrose).    Diet Order:   NPO  Skin:   Reviewed, no issues  Last BM:  6/6  Height:   Ht Readings from Last 1 Encounters:  04/18/17 _0  (1.676 m)    Weight:   Wt Readings from Last 1 Encounters:  04/20/17 158 lb 15.2 oz (72.1 kg)    Ideal Body Weight:  53.64 kg  BMI:  Body mass index is 25.66 kg/m.  Estimated Nutritional Needs:   Kcal:  1763  Protein:  72-89 grams (1.2-1.5 grams/kg)  Fluid:  >/= 1.4 L/day  EDUCATION NEEDS:   No education needs identified at this time    Jarome Matin, MS, RD, LDN, CNSC Inpatient Clinical Dietitian Pager # [phone] After hours/weekend pager # [phone]

## 2017-04-20 NOTE — Consult Note (Signed)
Consultation Note Date: 04/20/2017   Patient Name: Erik Massey  DOB: 1929/06/08  MRN: [id number]  Age / Sex: 83 y.o., male  PCP: Glendale Chard, MD Referring Physician: [name], MD  Reason for Consultation: Establishing goals of care  HPI/Patient Profile: 83 y.o. male  admitted on 04/12/2017   Clinical Assessment and Goals of Care:  83 yo gentleman with dementia, recent UTI, admitted with sepsis, in stepdown for ventilator dependent resp failure, possible aspiration event, ID also on board for health care associated PNA, transient enterococcal and providencia bacteremia, colonized with C diff, ongoing decline despite critical care measures, as evidenced by recurrent fevers, lack of responsiveness, CXR showing unchanged bibasilar infiltrates. A palliative consult has been requested for goals of care discussions.   I discussed with Dr Vaughan Browner from PCCM, Mr Hounshell has been seen and examined, he is not able to interact or even follow commands, there is no family in the room. I called his daughter Dr Karlton Lemon and introduced myself and palliative care as follows: Palliative medicine is specialized medical care for people living with serious illness. It focuses on providing relief from the symptoms and stress of a serious illness. The goal is to improve quality of life for both the patient and the family.  Dr Karlton Lemon is aware of our service, she met my colleague Dr Domingo Cocking previously. I provided her with active listening and supportive care. She plans on discussing about the patient's declining condition further with her mom this afternoon after she gets off work. I have offered to meet with her and her family for goals of care discussions, and to facilitate decision making.   Palliative Medicine Team remains available to help guide decision making, I will follow up in am. See recommendations below.   NEXT  OF KIN  spouse and daughter Dr Willey Blade.   SUMMARY OF RECOMMENDATIONS    Call placed and discussed with Dr Karlton Lemon:   Dr Karlton Lemon wishes to discuss with her mother today, I have had gentle brief discussions with her about the serious irreversible nature of Mr Asebedo's illnesses, offered support and have provided her with my numbers to coordinate a family meeting tomorrow morning on 04-21-17. Further recommendations to follow, to continue current course of care for now.   Thank you for the consult, we will follow along.   Code Status/Advance Care Planning:  Limited code   Symptom Management:    continue current mode of care for now.   Palliative Prophylaxis:   Delirium Protocol  Psycho-social/Spiritual:   Desire for further Chaplaincy support:no  Additional Recommendations: Education on Hospice  Prognosis:   Guarded   Discharge Planning: To Be Determined      Primary Diagnoses: Present on Admission: . Sepsis ([place]) . ARF (acute renal failure) ([place]) . Normochromic normocytic anemia . Diarrhea . UTI (urinary tract infection) . Dementia . Thrombocytopenia ([place]) . Enterococcal bacteremia . Gram-negative bacteremia . HCAP (healthcare-associated pneumonia)   I have reviewed the medical record, interviewed the patient and family, and examined the patient.  The following aspects are pertinent.  Past Medical History:  Diagnosis Date  . Alzheimer disease   . Seizures [facility])    Social History   Social History  . Marital status: Married    Spouse name: N/A  . Number of children: N/A  . Years of education: N/A   Social History Main Topics  . Smoking status: Never Smoker  . Smokeless tobacco: Never Used  . Alcohol use No  . Drug use: Unknown  . Sexual activity: Not Asked   Other Topics Concern  . None   Social History Narrative  . None   Family History  Problem Relation Age of Onset  . Family history unknown: Yes   Scheduled Meds: . betaxolol  1  drop Right Eye Daily  . carBAMazepine  200 mg Per Tube QID  . chlorhexidine gluconate (MEDLINE KIT)  15 mL Mouth Rinse BID  . dorzolamide  1 drop Right Eye Q12H  . feeding supplement (PRO-STAT SUGAR FREE 64)  30 mL Per Tube Daily  . finasteride  5 mg Oral Daily  . latanoprost  1 drop Right Eye QHS  . magnesium oxide  400 mg Per Tube Daily  . mouth rinse  15 mL Mouth Rinse QID  . multivitamin  15 mL Per Tube Daily  . tamsulosin  0.4 mg Oral Daily  . thyroid  90 mg Per Tube Daily   Continuous Infusions: . sodium chloride 50 mL/hr at 04/20/17 1200  . ceFEPime (MAXIPIME) IV Stopped (04/19/17 1355)  . famotidine (PEPCID) IV Stopped (04/20/17 6387)  . feeding supplement (OSMOLITE 1.2 CAL) Stopped (04/20/17 1120)  . vancomycin Stopped (04/20/17 0428)   PRN Meds:.acetaminophen **OR** acetaminophen, fentaNYL (SUBLIMAZE) injection, fentaNYL (SUBLIMAZE) injection, ondansetron **OR** ondansetron (ZOFRAN) IV Medications Prior to Admission:  Prior to Admission medications   Medication Sig Start Date End Date Taking? Authorizing Provider  ARMOUR THYROID 90 MG tablet Take 90 mg by mouth daily. 03/04/17  Yes [provider]  BETOPTIC-S 0.25 % ophthalmic suspension Place 1 drop into the right eye daily.  05/17/14  Yes [provider]  carbamazepine (TEGRETOL XR) 400 MG 12 hr tablet Take 1 tablet (400 mg total) by mouth 2 (two) times daily. 10/20/15  Yes Pollina, Gwenyth Allegra, MD  ciprofloxacin (CIPRO) 500 MG tablet Take 500 mg by mouth 2 (two) times daily. 03/28/17  Yes [provider]  dorzolamide (TRUSOPT) 2 % ophthalmic solution Place 1 drop into the right eye every 12 (twelve) hours.  05/16/14  Yes [provider]  finasteride (PROSCAR) 5 MG tablet Take 1 tablet (5 mg total) by mouth daily. 03/29/17  Yes Florencia Reasons, MD  latanoprost (XALATAN) 0.005 % ophthalmic solution Place 1 drop into the right eye at bedtime.  04/19/14  Yes [provider]  magnesium oxide  (MAG-OX) 400 (241.3 Mg) MG tablet Take 1 tablet (400 mg total) by mouth daily. 03/29/17  Yes Florencia Reasons, MD  metroNIDAZOLE (FLAGYL) 500 MG tablet Take 500 mg by mouth every 8 (eight) hours. 03/28/17  Yes [provider]  polyethylene glycol (MIRALAX / GLYCOLAX) packet Take 17 g by mouth daily. 03/27/17  Yes Florencia Reasons, MD  senna-docusate (SENOKOT-S) 8.6-50 MG tablet Take 1 tablet by mouth 2 (two) times daily. 03/27/17  Yes Florencia Reasons, MD  tamsulosin (FLOMAX) 0.4 MG CAPS capsule Take 1 capsule (0.4 mg total) by mouth daily. 03/27/17  Yes Florencia Reasons, MD   Allergies  Allergen Reactions  . Penicillins Swelling    Has patient  had a PCN reaction causing immediate rash, facial/tongue/throat swelling, SOB or lightheadedness with hypotension: yes Has patient had a PCN reaction causing severe rash involving mucus membranes or skin necrosis: no Has patient had a PCN reaction that required hospitalization- yes already in the hospital Has patient had a PCN reaction occurring within the last 10 years: No If all of the above answers are "NO", then may proceed with Cephalosporin use.    Review of Systems On the vent, responses minimally Appears to be in mild to moderate distress, based on non verbal gestures.   Physical Exam Weak appearing gentleman On the vent Has upper extremity contractures Shallow breathing Regular Generalized edema evident Eyes open, but does not track RN present in the room  Vital Signs: BP (!) 107/53   Pulse 91   Temp 99.3 F (37.4 C) (Axillary)   Resp 20   Ht 5' 6"  (1.676 m)   Wt 72.1 kg (158 lb 15.2 oz)   SpO2 97%   BMI 25.66 kg/m  Pain Assessment: CPOT   Pain Score: 2    SpO2: SpO2: 97 % O2 Device:SpO2: 97 % O2 Flow Rate: .O2 Flow Rate (L/min): 15 L/min  IO: Intake/output summary:  Intake/Output Summary (Last 24 hours) at 04/20/17 1320 Last data filed at 04/20/17 1200  Gross per 24 hour  Intake             2895 ml  Output             1340 ml  Net              1555 ml    LBM: Last BM Date:  (PTA) Baseline Weight: Weight: 59.6 kg (131 lb 6.3 oz) Most recent weight: Weight: 72.1 kg (158 lb 15.2 oz)     Palliative Assessment/Data:   Flowsheet Rows     Most Recent Value  Intake Tab  Referral Department  Critical care  Unit at Time of Referral  ICU  Palliative Care Primary Diagnosis  Other (Comment) [dementia, recurrent infections, VDRF ]  Date Notified  04/20/17  Palliative Care Type  Return patient Palliative Care  Reason for referral  Clarify Goals of Care  Date of Admission  04/12/17  Date first seen by Palliative Care  04/20/17  # of days IP prior to Palliative referral  8  Clinical Assessment  Palliative Performance Scale Score  10%  Pain Max last 24 hours  6  Pain Min Last 24 hours  4  Dyspnea Max Last 24 Hours  4  Dyspnea Min Last 24 hours  3  Anxiety Max Last 24 Hours  6  Anxiety Min Last 24 Hours  4  Psychosocial & Spiritual Assessment  Palliative Care Outcomes  Patient/Family meeting held?  Yes  Who was at the meeting?  discussed with patient's daughter over the phone.   Palliative Care Outcomes  Clarified goals of care      Time In:  12 Time Out:  1300 Time Total:  60 minutes.  Greater than 50%  of this time was spent counseling and coordinating care related to the above assessment and plan.  Signed by: Loistine Chance, MD  [phone]  Please contact Palliative Medicine Team phone at [phone] for questions and concerns.  For individual provider: See Shea Evans

## 2017-04-20 NOTE — Progress Notes (Signed)
[facility] called by RT in regards to ETT placement. Repeat CXR ordered by MD to confirm ETT placement. ETT 8.0 @ 25 lips secured to pts right mouth. No breakdown noted. BBS confirmed clear dim.

## 2017-04-20 NOTE — Progress Notes (Signed)
RT found ETT at 24 @ lips. CXR done. Results pending

## 2017-04-20 NOTE — Progress Notes (Signed)
[facility] Progress Note Patient Name: Erik Massey DOB: 1929-09-27 MRN: [id number]   Date of Service  04/20/2017  HPI/Events of Note  Hypoglycemia - Blood glucose = 83. Tube feeds stopped earlier today.   eICU Interventions  Will change 0.9 NaCl at 50 mL/hour to D5 0.9 NaCl at 50 mL/hour.     Intervention Category Major Interventions: Other:  Lysle Dingwall 04/20/2017, 10:18 PM

## 2017-04-20 NOTE — Progress Notes (Signed)
[facility] Progress Note Patient Name: Erik Massey DOB: 08-Jun-1929 MRN: [id number]   Date of Service  04/20/2017  HPI/Events of Note  Request for portable CXR to assess ETT position. ETT was 3 cm above the carina before it was advanced about 2 cm.   eICU Interventions  Will order:  1. Portable CXR now.      Intervention Category Minor Interventions: Clinical assessment - ordering diagnostic tests  Lysle Dingwall 04/20/2017, 5:33 PM

## 2017-04-20 NOTE — Progress Notes (Signed)
PULMONARY / CRITICAL CARE MEDICINE   Name: Erik Massey MRN: [id number] DOB: 02-16-1929    ADMISSION DATE:  04/12/2017 CONSULTATION DATE:  6/6  REFERRING MD:  [name]   CHIEF COMPLAINT:  Shock  BRIEF SUMMARY:    83 year old man with dementia & recent admission 5/17-5/21 for urinary retention due to BPH and cystitis, discharged with indwelling Foley to SNF for rehabilitation. He was readmitted 6/5 with altered mental status, hypotension and lactate of 6 with WBC count of 19.6. He required fluids and Neo-Synephrine drip.  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).  Transferred back to ICU 6/9, intubated for resp distress. Code status reversed by family.  SUBJECTIVE:  NSC. NGT advanced 5 cm.     VITAL SIGNS: BP (!) 133/51   Pulse 97   Temp (!) 100.5 F (38.1 C) (Axillary)   Resp 17   Ht 5\' 6"  (1.676 m)   Wt 158 lb 15.2 oz (72.1 kg)   SpO2 98%   BMI 25.66 kg/m   HEMODYNAMICS:   VENTILATOR SETTINGS: Vent Mode: PRVC FiO2 (%):  [30 %] 30 % Set Rate:  [16 bmp] 16 bmp Vt Set:  [510 mL] 510 mL PEEP:  [5 cmH20] 5 cmH20 Pressure Support:  [5 cmH20] 5 cmH20 Plateau Pressure:  [11 cmH20-18 cmH20] 18 cmH20  INTAKE / OUTPUT: I/O last 3 completed shifts: In: 5263.4 [I.V.:2613.7; NG/GT:2099.8; IV Piggyback:550] Out: 2360 [Urine:2360]  PHYSICAL EXAMINATION: General:  Frail, contracted, edematous  Male. Require fentanyl x 3 for HTN and jerking movements HEENT: OTT-> vent, neck contracted  OEV:[id number] to verbal stimuli Neuro: WD's to stimulus CV: HSR  PULM: Vented, coarse rhonchi bilaterally  HB:ZJIR, non-tender, bsx4 active  Extremities: warm/dry,+++ edema , contracted Skin: W/D    LABS:  BMET  Recent Labs Lab 04/16/17 0547 04/17/17 0132 04/19/17 1130  NA 139 139 134*  K 3.4* 3.8 3.3*  CL 112* 112* 106  CO2 20* 20* 22  BUN 15 20 17   CREATININE 1.02 1.09 1.15  GLUCOSE 77 120*  138*    Electrolytes  Recent Labs Lab 04/14/17 0835 04/15/17 0331 04/16/17 0547 04/17/17 0132 04/19/17 1130  CALCIUM 7.1* 7.1* 7.5* 7.8* 7.9*  MG 1.8 1.7 1.8  --   --   PHOS 1.8* 1.9* 2.6 2.4*  --     CBC  Recent Labs Lab 04/16/17 0547 04/17/17 0132 04/19/17 1130  WBC 9.1 5.6 17.0*  HGB 9.3* 10.2* 8.4*  HCT 27.2* 30.4* 24.3*  PLT 80* 86* 96*    Coag's No results for input(s): APTT, INR in the last 168 hours.  Sepsis Markers No results for input(s): LATICACIDVEN, PROCALCITON, O2SATVEN in the last 168 hours.  ABG  Recent Labs Lab 04/17/17 0235  PHART 7.471*  PCO2ART 24.2*  PO2ART 95.6    Liver Enzymes No results for input(s): AST, ALT, ALKPHOS, BILITOT, ALBUMIN in the last 168 hours.  Cardiac Enzymes No results for input(s): TROPONINI, PROBNP in the last 168 hours.  Glucose  Recent Labs Lab 04/19/17 0736 04/19/17 1119 04/19/17 1545 04/19/17 1926 04/19/17 2317 04/20/17 0339  GLUCAP 128* 145* 109* 123* 103* 125*    Imaging Dg Chest Port 1 View  Result Date: 04/20/2017 CLINICAL DATA:  resp failure, ETT positin EXAM: PORTABLE CHEST 1 VIEW COMPARISON:  Chest x-rays dated 04/19/2017 and 04/18/2017. FINDINGS: Endotracheal tube remains well positioned with tip approximately 2 cm above the carina. Enteric tube passes below the diaphragm. Heart size and  mediastinal contours appear stable. Perihilar and bibasilar opacities appear stable. No pneumothorax seen. Probable small bilateral pleural effusions. IMPRESSION: 1. Endotracheal tube well positioned with tip approximately 2 cm above the carina. 2. Enteric tube in the stomach, however, the tube has retracted slightly in the interval and proximal side holes are now at or near the gastroesophageal junction. Recommend advancement of at least 5 cm for optimal radiographic positioning. 3. Stable perihilar and bibasilar opacities, most likely pulmonary edema, pneumonia not excluded if febrile. These results will be  called to the ordering clinician or representative by the Radiologist Assistant, and communication documented in the PACS or zVision Dashboard. Electronically Signed   By: Franki Cabot M.D.   On: 04/20/2017 06:53   STUDIES:  CXR 6/9 >> worsening bilateral opacities likely pleural effusion with pneumonia.  CULTURES: BCx2 6/5 >> enterococcus faecalis (S-vanco), providencia stuartii (S-cefepime) C-Diff 6/5 >> antigen positive, toxin neg BCx2 6/7 >>  UC 6/9 >> negative   ANTIBIOTICS: Cefepime  6/5 >> 6/7 Ceftx 6/7 >> 6/10 Levaquin 6/5 >> 6/5 Vancomycin 6/5 >> Cefepime 6/6 >>  Metronidazole 6/6 >>   SIGNIFICANT EVENTS: 5/17 > 5/21 admit for urinary retention 6/05  Admit for sepsis 6/09  Rapid response. Intubated 6/10  Maintain on the vent overnight.  Soft BP but not on pressors 6/12  Weaning on PSV 5/5  LINES/TUBES: ETT 6/9 >>  DISCUSSION: 83 year old male with dementia and recent admit for urinary retention presenting with septic shock-enterococcal + providencia bacteremia, likely source being UTI.  C-diff antigen positive, toxin negative > likely colonization-prior CT scan had shown proctocolitis.  Back to ICU 6/9, intubated after respiratory distress due to presumed aspiration.  ASSESSMENT / PLAN:  RESPIRATORY  A: Acute Hypoxic Respiratory Failure - in setting of suspected aspiration  Possible Aspiration PNA Layering Bilateral Effusions P: PRVC 8 cc/kg Wean PEEP / FiO2 for sats > 92% Intermittent CXR Daily SBT / WUA  Monitor effusions   CARDIOVASCULAR A:  Septic Shock - resolved Limited IV Access P:  ICU monitoring  See ID  LCB > no CPR in the event of arrest  Unable to place PICC, unable to draw labs > daughter ok with peripheral pressors but does not want central access.  RENAL Lab Results  Component Value Date   CREATININE 1.15 04/19/2017   CREATININE 1.09 04/17/2017   CREATININE 1.02 04/16/2017    Recent Labs Lab 04/16/17 0547 04/17/17 0132  04/19/17 1130  K 3.4* 3.8 3.3*     A:   AKI - resolved Hypophosphatemia BPH - urinary retention with indwelling foley P:   Trend BMP / urinary output Replace electrolytes as indicated Avoid nephrotoxic agents, ensure adequate renal perfusion Maintain foley catheter  Continue proscar, flomax  GASTROINTESTINAL A:   Mild protein calorie malnutrition Dysphagia worse compared to 2 wks ago - due to acute illness NG advanced 6/13 5 cm P:   Pepcid for SUP  NPO OGT TF per Nutrition Abd film to confirm placement 6/13  HEMATOLOGIC  Recent Labs  04/19/17 1130  HGB 8.4*    A:   Anemia of chronic disease  (hgb close to baseline) Thrombocytopenia 86->96 P:  Trend CBC  SCD's for DVT prophylaxis  Monitor for bleeding   INFECTIOUS A:   Sepsis - secondary to recurrent UTI with indwelling foley.  LLL Opacity on CXR - favor atx C-Diff - favor colonization  Now with new aspiration, HCAP P:   ABX as above, D9/x  Abx per ID Follow  cultures to maturity    ENDOCRINE CBG (last 3)   Recent Labs  04/19/17 1926 04/19/17 2317 04/20/17 0339  GLUCAP 123* 103* 125*  TSH 6.5 03/26/17   A:   Hypoglycemia Hypothyroidism  P:   TF  Monitor glucose on BMP  Continue Armour   NEUROLOGIC A:   Acute metabolic encephalopathy Alzheimer's dementia - baseline alert / responsive prior to admit, daughter hopeful to get him back to prior baseline P:   RASS goal: 0 to -1  Fentanyl PRN for pain / sedation  Noted to have neck and ext contractures  Question: Do we need palliative care consult 6/13    FAMILY  - Updates: Daughter updated via phone 6/11 > agrees no central access.      CC Time: 30 minutes   Richardson Landry Misao Fackrell ACNP [facility] PCC[facility] Pager [phone] till 3 pm If no answer page [phone] 04/20/2017, 7:47 AM

## 2017-04-20 NOTE — Progress Notes (Signed)
Patient ID: Erik Massey, male   DOB: 04-Sep-1929, 83 y.o.   MRN: [id number]          [facility]  Date of Admission:  04/12/2017           Day 8 vancomycin        Day 4 cefepime         Principal Problem:   HCAP (healthcare-associated pneumonia) Active Problems:   UTI (urinary tract infection)   Sepsis ([place])   Enterococcal bacteremia   Gram-negative bacteremia   Seizure (H[facility])   ARF (acute renal failure) (H[facility])   Normochromic normocytic anemia   Diarrhea   Dementia   Thrombocytopenia ([facility])   Moderate protein-calorie malnutrition ([place])   Sebaceous cyst   Respiratory failure ([place])   . betaxolol  1 drop Right Eye Daily  . carBAMazepine  200 mg Per Tube QID  . chlorhexidine gluconate (MEDLINE KIT)  15 mL Mouth Rinse BID  . dorzolamide  1 drop Right Eye Q12H  . feeding supplement (PRO-STAT SUGAR FREE 64)  30 mL Per Tube Daily  . finasteride  5 mg Oral Daily  . latanoprost  1 drop Right Eye QHS  . magnesium oxide  400 mg Per Tube Daily  . mouth rinse  15 mL Mouth Rinse QID  . multivitamin  15 mL Per Tube Daily  . tamsulosin  0.4 mg Oral Daily  . thyroid  90 mg Per Tube Daily   Review of Systems: Review of Systems  Unable to perform ROS: Intubated    Past Medical History:  Diagnosis Date  . Alzheimer disease   . Seizures ([place])     Social History  Substance Use Topics  . Smoking status: Never Smoker  . Smokeless tobacco: Never Used  . Alcohol use No    Family History  Problem Relation Age of Onset  . Family history unknown: Yes   Allergies  Allergen Reactions  . Penicillins Swelling    Has patient had a PCN reaction causing immediate rash, facial/tongue/throat swelling, SOB or lightheadedness with hypotension: yes Has patient had a PCN reaction causing severe rash involving mucus membranes or skin necrosis: no Has patient had a PCN reaction that required hospitalization- yes already in the hospital Has patient had a PCN  reaction occurring within the last 10 years: No If all of the above answers are "NO", then may proceed with Cephalosporin use.     OBJECTIVE: Vitals:   04/20/17 0700 04/20/17 0800 04/20/17 0810 04/20/17 0900  BP: (!) 145/78 (!) 162/57 (!) 162/57 (!) 145/51  Pulse:   86   Resp: (!) 21 17 (!) 22 16  Temp:   99.3 F (37.4 C)   TempSrc:   Axillary   SpO2: 99% 99% 100% 100%  Weight:      Height:       Body mass index is 25.66 kg/m.  Physical Exam  Constitutional:  He remains poorly responsive on the ventilator. A received fentanyl 3 overnight for sedation.  Cardiovascular: Normal rate and regular rhythm.   No murmur heard. Pulmonary/Chest: He has no wheezes.  Diminished breath sounds on the right.  Abdominal: Soft.  No diarrhea.  Genitourinary:  Genitourinary Comments: Penile and scrotal edema.  Musculoskeletal: He exhibits edema.    Lab Results Lab Results  Component Value Date   WBC 17.0 (H) 04/19/2017   HGB 8.4 (L) 04/19/2017   HCT 24.3 (L) 04/19/2017   MCV 86.5 04/19/2017   PLT 96 (L)  04/19/2017    Lab Results  Component Value Date   CREATININE 1.15 04/19/2017   BUN 17 04/19/2017   NA 134 (L) 04/19/2017   K 3.3 (L) 04/19/2017   CL 106 04/19/2017   CO2 22 04/19/2017    Lab Results  Component Value Date   ALT 72 (H) 04/13/2017   AST 115 (H) 04/13/2017   ALKPHOS 87 04/13/2017   BILITOT 0.5 04/13/2017     Microbiology: Recent Results (from the past 240 hour(s))  C difficile quick scan w PCR reflex     Status: Abnormal   Collection Time: 04/12/17  5:33 PM  Result Value Ref Range Status   C Diff antigen POSITIVE (A) NEGATIVE Final   C Diff toxin NEGATIVE NEGATIVE Final   C Diff interpretation Results are indeterminate. See PCR results.  Final  Clostridium Difficile by PCR     Status: Abnormal   Collection Time: 04/12/17  5:33 PM  Result Value Ref Range Status   Toxigenic C Difficile by pcr POSITIVE (A) NEGATIVE Final    Comment: Positive for  toxigenic C. difficile with little to no toxin production. Only treat if clinical presentation suggests symptomatic illness.  Blood Culture (routine x 2)     Status: Abnormal   Collection Time: 04/12/17  6:50 PM  Result Value Ref Range Status   Specimen Description BLOOD LEFT HAND  Final   Special Requests IN PEDIATRIC BOTTLE Blood Culture adequate volume  Final   Culture  Setup Time   Final    GRAM NEGATIVE RODS IN PEDIATRIC BOTTLE CRITICAL RESULT CALLED TO, READ BACK BY AND VERIFIED WITH: A. PHAM PHARMD, AT 1518 04/13/17 BY Rush Landmark Performed at [facility], [place] [address], [place], [place] [address]    Culture PROVIDENCIA STUARTII (A)  Final   Report Status 04/15/2017 FINAL  Final   Organism ID, Bacteria PROVIDENCIA STUARTII  Final      Susceptibility   Providencia stuartii - MIC*    AMPICILLIN >=32 RESISTANT Resistant     CEFAZOLIN >=64 RESISTANT Resistant     CEFEPIME <=1 SENSITIVE Sensitive     CEFTAZIDIME <=1 SENSITIVE Sensitive     CEFTRIAXONE <=1 SENSITIVE Sensitive     CIPROFLOXACIN >=4 RESISTANT Resistant     GENTAMICIN RESISTANT Resistant     IMIPENEM 1 SENSITIVE Sensitive     TRIMETH/SULFA >=320 RESISTANT Resistant     AMPICILLIN/SULBACTAM >=32 RESISTANT Resistant     PIP/TAZO <=4 SENSITIVE Sensitive     * PROVIDENCIA STUARTII  Blood Culture ID Panel (Reflexed)     Status: None   Collection Time: 04/12/17  6:50 PM  Result Value Ref Range Status   Enterococcus species NOT DETECTED NOT DETECTED Final   Listeria monocytogenes NOT DETECTED NOT DETECTED Final   Staphylococcus species NOT DETECTED NOT DETECTED Final   Staphylococcus aureus NOT DETECTED NOT DETECTED Final   Streptococcus species NOT DETECTED NOT DETECTED Final   Streptococcus agalactiae NOT DETECTED NOT DETECTED Final   Streptococcus pneumoniae NOT DETECTED NOT DETECTED Final   Streptococcus pyogenes NOT DETECTED NOT DETECTED Final   Acinetobacter baumannii NOT DETECTED NOT DETECTED Final    Enterobacteriaceae species NOT DETECTED NOT DETECTED Final   Enterobacter cloacae complex NOT DETECTED NOT DETECTED Final   Escherichia coli NOT DETECTED NOT DETECTED Final   Klebsiella oxytoca NOT DETECTED NOT DETECTED Final   Klebsiella pneumoniae NOT DETECTED NOT DETECTED Final   Proteus species NOT DETECTED NOT DETECTED Final   Serratia marcescens NOT  DETECTED NOT DETECTED Final   Haemophilus influenzae NOT DETECTED NOT DETECTED Final   Neisseria meningitidis NOT DETECTED NOT DETECTED Final   Pseudomonas aeruginosa NOT DETECTED NOT DETECTED Final   Candida albicans NOT DETECTED NOT DETECTED Final   Candida glabrata NOT DETECTED NOT DETECTED Final   Candida krusei NOT DETECTED NOT DETECTED Final   Candida parapsilosis NOT DETECTED NOT DETECTED Final   Candida tropicalis NOT DETECTED NOT DETECTED Final    Comment: Performed at [facility], [place] [address] [place], [place] [address]  Blood Culture (routine x 2)     Status: Abnormal   Collection Time: 04/12/17 10:20 PM  Result Value Ref Range Status   Specimen Description BLOOD BLOOD RIGHT FOREARM  Final   Special Requests   Final    BOTTLES DRAWN AEROBIC AND ANAEROBIC Blood Culture adequate volume   Culture  Setup Time   Final    GRAM POSITIVE COCCI IN PAIRS IN BOTH AEROBIC AND ANAEROBIC BOTTLES CRITICAL RESULT CALLED TO, READ BACK BY AND VERIFIED WITH: D. ZIEGLER, RPHARMD ([facility]) AT 1800 ON 04/13/17 BY C. JESSUP, MLT. Performed at [facility], [place] [address] [place], [place] [address]    Culture ENTEROCOCCUS FAECALIS (A)  Final   Report Status 04/15/2017 FINAL  Final   Organism ID, Bacteria ENTEROCOCCUS FAECALIS  Final      Susceptibility   Enterococcus faecalis - MIC*    AMPICILLIN <=2 SENSITIVE Sensitive     VANCOMYCIN 4 SENSITIVE Sensitive     GENTAMICIN SYNERGY RESISTANT Resistant     * ENTEROCOCCUS FAECALIS  Blood Culture ID Panel (Reflexed)     Status: Abnormal   Collection Time: 04/12/17 10:20 PM  Result  Value Ref Range Status   Enterococcus species DETECTED (A) NOT DETECTED Final    Comment: CRITICAL RESULT CALLED TO, READ BACK BY AND VERIFIED WITH: D. ZIEGLER, RPHARMD AT 1800 ON 04/13/17 BY C. JESSUP, MLT.    Vancomycin resistance NOT DETECTED NOT DETECTED Final   Listeria monocytogenes NOT DETECTED NOT DETECTED Final   Staphylococcus species NOT DETECTED NOT DETECTED Final   Staphylococcus aureus NOT DETECTED NOT DETECTED Final   Streptococcus species NOT DETECTED NOT DETECTED Final   Streptococcus agalactiae NOT DETECTED NOT DETECTED Final   Streptococcus pneumoniae NOT DETECTED NOT DETECTED Final   Streptococcus pyogenes NOT DETECTED NOT DETECTED Final   Acinetobacter baumannii NOT DETECTED NOT DETECTED Final   Enterobacteriaceae species NOT DETECTED NOT DETECTED Final   Enterobacter cloacae complex NOT DETECTED NOT DETECTED Final   Escherichia coli NOT DETECTED NOT DETECTED Final   Klebsiella oxytoca NOT DETECTED NOT DETECTED Final   Klebsiella pneumoniae NOT DETECTED NOT DETECTED Final   Proteus species NOT DETECTED NOT DETECTED Final   Serratia marcescens NOT DETECTED NOT DETECTED Final   Haemophilus influenzae NOT DETECTED NOT DETECTED Final   Neisseria meningitidis NOT DETECTED NOT DETECTED Final   Pseudomonas aeruginosa NOT DETECTED NOT DETECTED Final   Candida albicans NOT DETECTED NOT DETECTED Final   Candida glabrata NOT DETECTED NOT DETECTED Final   Candida krusei NOT DETECTED NOT DETECTED Final   Candida parapsilosis NOT DETECTED NOT DETECTED Final   Candida tropicalis NOT DETECTED NOT DETECTED Final    Comment: Performed at [facility], [place] [address], [place], [place] [address]  MRSA PCR Screening     Status: None   Collection Time: 04/13/17 12:01 AM  Result Value Ref Range Status   MRSA by PCR NEGATIVE NEGATIVE Final  Comment:        The GeneXpert MRSA Assay (FDA approved for NASAL specimens only), is one component of a comprehensive MRSA  colonization surveillance program. It is not intended to diagnose MRSA infection nor to guide or monitor treatment for MRSA infections.   Culture, Urine     Status: Abnormal   Collection Time: 04/13/17  1:37 AM  Result Value Ref Range Status   Specimen Description URINE, RANDOM  Final   Special Requests NONE  Final   Culture MULTIPLE SPECIES PRESENT, SUGGEST RECOLLECTION (A)  Final   Report Status 04/14/2017 FINAL  Final  Culture, blood (routine x 2)     Status: None   Collection Time: 04/14/17  4:52 PM  Result Value Ref Range Status   Specimen Description BLOOD RIGHT HAND  Final   Special Requests   Final    BOTTLES DRAWN AEROBIC AND ANAEROBIC Blood Culture adequate volume   Culture   Final    NO GROWTH 5 DAYS Performed at [facility], [place] [address], [place], [place] [address]    Report Status 04/19/2017 FINAL  Final  Culture, blood (routine x 2)     Status: None   Collection Time: 04/14/17  4:52 PM  Result Value Ref Range Status   Specimen Description BLOOD RIGHT HAND  Final   Special Requests   Final    BOTTLES DRAWN AEROBIC AND ANAEROBIC Blood Culture adequate volume   Culture   Final    NO GROWTH 5 DAYS Performed at [facility], [place] [address], [place], [place] [address]    Report Status 04/19/2017 FINAL  Final  Urine Culture     Status: None   Collection Time: 04/16/17 12:36 PM  Result Value Ref Range Status   Specimen Description URINE, RANDOM  Final   Special Requests NONE  Final   Culture   Final    NO GROWTH Performed at [facility], [place] [address], [place], [place] [address]    Report Status 04/17/2017 FINAL  Final     ASSESSMENT: He has developed healthcare associated pneumonia while on therapy for urinary tract infection and transient enterococcal and Providencia bacteremia. He is also colonized with C. difficile but has no evidence of active colitis. He has developed recurrent fever overnight. His chest x-ray shows unchanged  bibasilar infiltrates. Overall his prognosis seems to be very poor. I will continue vancomycin and cefepime for now. If fevers persist will obtain repeat cultures.  PLAN: 1. Continue vancomycin and cefepime  Michel Bickers, MD [facility] [phone] pager   [phone] cell 04/20/2017, 9:50 AM

## 2017-04-20 NOTE — Progress Notes (Signed)
OG tube advanced 5cm per radiology recommendations.

## 2017-04-21 ENCOUNTER — Inpatient Hospital Stay (HOSPITAL_COMMUNITY): Payer: Medicare Other

## 2017-04-21 LAB — GLUCOSE, CAPILLARY
GLUCOSE-CAPILLARY: 81 mg/dL (ref 65–99)
GLUCOSE-CAPILLARY: 84 mg/dL (ref 65–99)
GLUCOSE-CAPILLARY: 97 mg/dL (ref 65–99)
GLUCOSE-CAPILLARY: 99 mg/dL (ref 65–99)
Glucose-Capillary: 106 mg/dL — ABNORMAL HIGH (ref 65–99)
Glucose-Capillary: 69 mg/dL (ref 65–99)
Glucose-Capillary: 73 mg/dL (ref 65–99)
Glucose-Capillary: 86 mg/dL (ref 65–99)

## 2017-04-21 LAB — CBC
HCT: 24.8 % — ABNORMAL LOW (ref 39.0–52.0)
HEMOGLOBIN: 8.4 g/dL — AB (ref 13.0–17.0)
MCH: 29.3 pg (ref 26.0–34.0)
MCHC: 33.9 g/dL (ref 30.0–36.0)
MCV: 86.4 fL (ref 78.0–100.0)
PLATELETS: 130 10*3/uL — AB (ref 150–400)
RBC: 2.87 MIL/uL — AB (ref 4.22–5.81)
RDW: 14.5 % (ref 11.5–15.5)
WBC: 12.6 10*3/uL — ABNORMAL HIGH (ref 4.0–10.5)

## 2017-04-21 LAB — BASIC METABOLIC PANEL
ANION GAP: 6 (ref 5–15)
BUN: 20 mg/dL (ref 6–20)
CALCIUM: 7.5 mg/dL — AB (ref 8.9–10.3)
CO2: 21 mmol/L — AB (ref 22–32)
CREATININE: 1.1 mg/dL (ref 0.61–1.24)
Chloride: 107 mmol/L (ref 101–111)
GFR calc non Af Amer: 59 mL/min — ABNORMAL LOW (ref >60)
GLUCOSE: 87 mg/dL (ref 65–99)
Potassium: 3.9 mmol/L (ref 3.5–5.1)
Sodium: 134 mmol/L — ABNORMAL LOW (ref 135–145)

## 2017-04-21 LAB — PHOSPHORUS: Phosphorus: 3.2 mg/dL (ref 2.5–4.6)

## 2017-04-21 LAB — MAGNESIUM: MAGNESIUM: 1.8 mg/dL (ref 1.7–2.4)

## 2017-04-21 MED ORDER — FUROSEMIDE 10 MG/ML IJ SOLN
20.0000 mg | Freq: Once | INTRAMUSCULAR | Status: AC
  Administered 2017-04-21: 20 mg via INTRAVENOUS
  Filled 2017-04-21: qty 2

## 2017-04-21 NOTE — Progress Notes (Addendum)
Date:  April 21, 2017  Chart reviewed for concurrent status and case management needs.  Will continue to follow patient progress. Remains in septic state.  Discharge Planning:  Will inquire about possible LTACH placement for this patient.  Day 8 on the vent. Expected discharge date: [id number]  Velva Harman, [place], [place], [place]

## 2017-04-21 NOTE — Progress Notes (Signed)
Patient ID: Erik Massey, male   DOB: 1929-03-15, 83 y.o.   MRN: [id number]          [facility] for Infectious Disease  Date of Admission:  04/12/2017           Day 9 vancomycin        Day 5 cefepime         Principal Problem:   HCAP (healthcare-associated pneumonia) Active Problems:   UTI (urinary tract infection)   Sepsis ([place])   Enterococcal bacteremia   Gram-negative bacteremia   Seizure (HCC)   ARF (acute renal failure) (HCC)   Normochromic normocytic anemia   Diarrhea   Dementia   Thrombocytopenia (HCC)   Moderate protein-calorie malnutrition ([place])   Sebaceous cyst   Respiratory failure ([place])   . betaxolol  1 drop Right Eye Daily  . carBAMazepine  200 mg Per Tube QID  . chlorhexidine gluconate (MEDLINE KIT)  15 mL Mouth Rinse BID  . dorzolamide  1 drop Right Eye Q12H  . feeding supplement (PRO-STAT SUGAR FREE 64)  30 mL Per Tube Daily  . finasteride  5 mg Oral Daily  . latanoprost  1 drop Right Eye QHS  . magnesium oxide  400 mg Per Tube Daily  . mouth rinse  15 mL Mouth Rinse QID  . multivitamin  15 mL Per Tube Daily  . tamsulosin  0.4 mg Oral Daily  . thyroid  90 mg Per Tube Daily   Review of Systems: Review of Systems  Unable to perform ROS: Intubated    Past Medical History:  Diagnosis Date  . Alzheimer disease   . Seizures ([facility])     Social History  Substance Use Topics  . Smoking status: Never Smoker  . Smokeless tobacco: Never Used  . Alcohol use No    Family History  Problem Relation Age of Onset  . Family history unknown: Yes   Allergies  Allergen Reactions  . Penicillins Swelling    Has patient had a PCN reaction causing immediate rash, facial/tongue/throat swelling, SOB or lightheadedness with hypotension: yes Has patient had a PCN reaction causing severe rash involving mucus membranes or skin necrosis: no Has patient had a PCN reaction that required hospitalization- yes already in the hospital Has patient had a PCN  reaction occurring within the last 10 years: No If all of the above answers are "NO", then may proceed with Cephalosporin use.     OBJECTIVE: Vitals:   04/21/17 1500 04/21/17 1530 04/21/17 1556 04/21/17 1600  BP: (!) 125/48   (!) 118/53  Pulse:      Resp: (!) 25   (!) 24  Temp:  (!) 102 F (38.9 C)    TempSrc:  Axillary    SpO2: 100%  100% 100%  Weight:      Height:       Body mass index is 25.94 kg/m.  Physical Exam  Constitutional:  He remains poorly responsive on the ventilator.   Cardiovascular: Normal rate and regular rhythm.   No murmur heard. Pulmonary/Chest: He has no wheezes.  Diminished breath sounds on the right.  Abdominal: Soft.  No diarrhea.  Genitourinary:  Genitourinary Comments: Penile and scrotal edema.  Musculoskeletal: He exhibits edema.    Lab Results Lab Results  Component Value Date   WBC 12.6 (H) 04/21/2017   HGB 8.4 (L) 04/21/2017   HCT 24.8 (L) 04/21/2017   MCV 86.4 04/21/2017   PLT 130 (L) 04/21/2017    Lab Results  Component Value Date   CREATININE 1.10 04/21/2017   BUN 20 04/21/2017   NA 134 (L) 04/21/2017   K 3.9 04/21/2017   CL 107 04/21/2017   CO2 21 (L) 04/21/2017    Lab Results  Component Value Date   ALT 72 (H) 04/13/2017   AST 115 (H) 04/13/2017   ALKPHOS 87 04/13/2017   BILITOT 0.5 04/13/2017     Microbiology: Recent Results (from the past 240 hour(s))  C difficile quick scan w PCR reflex     Status: Abnormal   Collection Time: 04/12/17  5:33 PM  Result Value Ref Range Status   C Diff antigen POSITIVE (A) NEGATIVE Final   C Diff toxin NEGATIVE NEGATIVE Final   C Diff interpretation Results are indeterminate. See PCR results.  Final  Clostridium Difficile by PCR     Status: Abnormal   Collection Time: 04/12/17  5:33 PM  Result Value Ref Range Status   Toxigenic C Difficile by pcr POSITIVE (A) NEGATIVE Final    Comment: Positive for toxigenic C. difficile with little to no toxin production. Only treat if  clinical presentation suggests symptomatic illness.  Blood Culture (routine x 2)     Status: Abnormal   Collection Time: 04/12/17  6:50 PM  Result Value Ref Range Status   Specimen Description BLOOD LEFT HAND  Final   Special Requests IN PEDIATRIC BOTTLE Blood Culture adequate volume  Final   Culture  Setup Time   Final    GRAM NEGATIVE RODS IN PEDIATRIC BOTTLE CRITICAL RESULT CALLED TO, READ BACK BY AND VERIFIED WITH: A. PHAM PHARMD, AT 1518 04/13/17 BY Rush Landmark Performed at [facility], [place] [address], [place], [place] [address]    Culture PROVIDENCIA STUARTII (A)  Final   Report Status 04/15/2017 FINAL  Final   Organism ID, Bacteria PROVIDENCIA STUARTII  Final      Susceptibility   Providencia stuartii - MIC*    AMPICILLIN >=32 RESISTANT Resistant     CEFAZOLIN >=64 RESISTANT Resistant     CEFEPIME <=1 SENSITIVE Sensitive     CEFTAZIDIME <=1 SENSITIVE Sensitive     CEFTRIAXONE <=1 SENSITIVE Sensitive     CIPROFLOXACIN >=4 RESISTANT Resistant     GENTAMICIN RESISTANT Resistant     IMIPENEM 1 SENSITIVE Sensitive     TRIMETH/SULFA >=320 RESISTANT Resistant     AMPICILLIN/SULBACTAM >=32 RESISTANT Resistant     PIP/TAZO <=4 SENSITIVE Sensitive     * PROVIDENCIA STUARTII  Blood Culture ID Panel (Reflexed)     Status: None   Collection Time: 04/12/17  6:50 PM  Result Value Ref Range Status   Enterococcus species NOT DETECTED NOT DETECTED Final   Listeria monocytogenes NOT DETECTED NOT DETECTED Final   Staphylococcus species NOT DETECTED NOT DETECTED Final   Staphylococcus aureus NOT DETECTED NOT DETECTED Final   Streptococcus species NOT DETECTED NOT DETECTED Final   Streptococcus agalactiae NOT DETECTED NOT DETECTED Final   Streptococcus pneumoniae NOT DETECTED NOT DETECTED Final   Streptococcus pyogenes NOT DETECTED NOT DETECTED Final   Acinetobacter baumannii NOT DETECTED NOT DETECTED Final   Enterobacteriaceae species NOT DETECTED NOT DETECTED Final   Enterobacter  cloacae complex NOT DETECTED NOT DETECTED Final   Escherichia coli NOT DETECTED NOT DETECTED Final   Klebsiella oxytoca NOT DETECTED NOT DETECTED Final   Klebsiella pneumoniae NOT DETECTED NOT DETECTED Final   Proteus species NOT DETECTED NOT DETECTED Final   Serratia marcescens NOT DETECTED NOT DETECTED Final   Haemophilus  influenzae NOT DETECTED NOT DETECTED Final   Neisseria meningitidis NOT DETECTED NOT DETECTED Final   Pseudomonas aeruginosa NOT DETECTED NOT DETECTED Final   Candida albicans NOT DETECTED NOT DETECTED Final   Candida glabrata NOT DETECTED NOT DETECTED Final   Candida krusei NOT DETECTED NOT DETECTED Final   Candida parapsilosis NOT DETECTED NOT DETECTED Final   Candida tropicalis NOT DETECTED NOT DETECTED Final    Comment: Performed at [facility], [place] [address] [place], [place] [address]  Blood Culture (routine x 2)     Status: Abnormal   Collection Time: 04/12/17 10:20 PM  Result Value Ref Range Status   Specimen Description BLOOD BLOOD RIGHT FOREARM  Final   Special Requests   Final    BOTTLES DRAWN AEROBIC AND ANAEROBIC Blood Culture adequate volume   Culture  Setup Time   Final    GRAM POSITIVE COCCI IN PAIRS IN BOTH AEROBIC AND ANAEROBIC BOTTLES CRITICAL RESULT CALLED TO, READ BACK BY AND VERIFIED WITH: D. ZIEGLER, RPHARMD ([facility]) AT 1800 ON 04/13/17 BY C. JESSUP, MLT. Performed at [facility], [place] [address] [place], [place] [address]    Culture ENTEROCOCCUS FAECALIS (A)  Final   Report Status 04/15/2017 FINAL  Final   Organism ID, Bacteria ENTEROCOCCUS FAECALIS  Final      Susceptibility   Enterococcus faecalis - MIC*    AMPICILLIN <=2 SENSITIVE Sensitive     VANCOMYCIN 4 SENSITIVE Sensitive     GENTAMICIN SYNERGY RESISTANT Resistant     * ENTEROCOCCUS FAECALIS  Blood Culture ID Panel (Reflexed)     Status: Abnormal   Collection Time: 04/12/17 10:20 PM  Result Value Ref Range Status   Enterococcus species DETECTED (A) NOT DETECTED Final      Comment: CRITICAL RESULT CALLED TO, READ BACK BY AND VERIFIED WITH: D. ZIEGLER, RPHARMD AT 1800 ON 04/13/17 BY C. JESSUP, MLT.    Vancomycin resistance NOT DETECTED NOT DETECTED Final   Listeria monocytogenes NOT DETECTED NOT DETECTED Final   Staphylococcus species NOT DETECTED NOT DETECTED Final   Staphylococcus aureus NOT DETECTED NOT DETECTED Final   Streptococcus species NOT DETECTED NOT DETECTED Final   Streptococcus agalactiae NOT DETECTED NOT DETECTED Final   Streptococcus pneumoniae NOT DETECTED NOT DETECTED Final   Streptococcus pyogenes NOT DETECTED NOT DETECTED Final   Acinetobacter baumannii NOT DETECTED NOT DETECTED Final   Enterobacteriaceae species NOT DETECTED NOT DETECTED Final   Enterobacter cloacae complex NOT DETECTED NOT DETECTED Final   Escherichia coli NOT DETECTED NOT DETECTED Final   Klebsiella oxytoca NOT DETECTED NOT DETECTED Final   Klebsiella pneumoniae NOT DETECTED NOT DETECTED Final   Proteus species NOT DETECTED NOT DETECTED Final   Serratia marcescens NOT DETECTED NOT DETECTED Final   Haemophilus influenzae NOT DETECTED NOT DETECTED Final   Neisseria meningitidis NOT DETECTED NOT DETECTED Final   Pseudomonas aeruginosa NOT DETECTED NOT DETECTED Final   Candida albicans NOT DETECTED NOT DETECTED Final   Candida glabrata NOT DETECTED NOT DETECTED Final   Candida krusei NOT DETECTED NOT DETECTED Final   Candida parapsilosis NOT DETECTED NOT DETECTED Final   Candida tropicalis NOT DETECTED NOT DETECTED Final    Comment: Performed at [facility], [place] [address] [place], [place] [address]  MRSA PCR Screening     Status: None   Collection Time: 04/13/17 12:01 AM  Result Value Ref Range Status   MRSA by PCR NEGATIVE NEGATIVE Final    Comment:  The GeneXpert MRSA Assay (FDA approved for NASAL specimens only), is one component of a comprehensive MRSA colonization surveillance program. It is not intended to diagnose MRSA infection nor to  guide or monitor treatment for MRSA infections.   Culture, Urine     Status: Abnormal   Collection Time: 04/13/17  1:37 AM  Result Value Ref Range Status   Specimen Description URINE, RANDOM  Final   Special Requests NONE  Final   Culture MULTIPLE SPECIES PRESENT, SUGGEST RECOLLECTION (A)  Final   Report Status 04/14/2017 FINAL  Final  Culture, blood (routine x 2)     Status: None   Collection Time: 04/14/17  4:52 PM  Result Value Ref Range Status   Specimen Description BLOOD RIGHT HAND  Final   Special Requests   Final    BOTTLES DRAWN AEROBIC AND ANAEROBIC Blood Culture adequate volume   Culture   Final    NO GROWTH 5 DAYS Performed at [facility], [place] [address] [place], [place] [address]    Report Status 04/19/2017 FINAL  Final  Culture, blood (routine x 2)     Status: None   Collection Time: 04/14/17  4:52 PM  Result Value Ref Range Status   Specimen Description BLOOD RIGHT HAND  Final   Special Requests   Final    BOTTLES DRAWN AEROBIC AND ANAEROBIC Blood Culture adequate volume   Culture   Final    NO GROWTH 5 DAYS Performed at [facility], [place] [address], [place], [place] [address]    Report Status 04/19/2017 FINAL  Final  Urine Culture     Status: None   Collection Time: 04/16/17 12:36 PM  Result Value Ref Range Status   Specimen Description URINE, RANDOM  Final   Special Requests NONE  Final   Culture   Final    NO GROWTH Performed at [facility], [place] [address], [place], [place] [address]    Report Status 04/17/2017 FINAL  Final     ASSESSMENT: He continues to do very poorly on therapy for UTI, enterococcal and Providencia bacteremia and superimposed healthcare associated pneumonia. He is now febrile to 102. I agree with transitioning to comfort care measures alone if his family is in agreement. Repeat cultures have been ordered. I will continue current antibiotics and follow up in the morning.  PLAN: 1. Continue vancomycin and  cefepime  Michel Bickers, MD [facility] for Infectious [place] [phone] pager   [phone] cell 04/21/2017, 5:14 PM

## 2017-04-21 NOTE — Progress Notes (Signed)
PMT progress note  Overnight events noted, patient seen, discussed with ICU RN. CXR from this am with mild worsening of bilateral airspace disease.  BP (!) 114/53 (BP Location: Right Leg)   Pulse 69   Temp 99.9 F (37.7 C) (Axillary)   Resp 18   Ht 5\' 6"  (1.676 m)   Wt 72.9 kg (160 lb 11.5 oz)   SpO2 100%   BMI 25.94 kg/m  Remains poorly responsive Contractures On the vent Has edema  A/P: Dementia Recently admitted for UTI Sepsis VDRF HCAP  From a palliative perspective, we recommend a more comfort based approach to care, Erik Massey remains intubated, poorly responsive, with health care associated pneumonia, in the setting of recent UTI, remains intubated, now off tube feeds. He has required 50 mcg IV Fentanyl times 3 in the past 24 hours.   As per my discussions with Dr Heath Gold on 04-20-17, she has been provided with information on how to contact our service,we remain available to facilitate goals of care discussions and to help guide decision making as best as we can. I do plan on reaching out to Dr Heath Gold later today.   15 minutes spent [place] [phone]

## 2017-04-21 NOTE — Progress Notes (Signed)
PULMONARY / CRITICAL CARE MEDICINE   Name: Erik Massey MRN: [id number] DOB: 1929-08-15    ADMISSION DATE:  04/12/2017 CONSULTATION DATE:  6/6  REFERRING MD:  [name]   CHIEF COMPLAINT:  Shock  BRIEF SUMMARY:    83 year old man with dementia & recent admission 5/17-5/21 for urinary retention due to BPH and cystitis, discharged with indwelling Foley to SNF for rehabilitation. He was readmitted 6/5 with altered mental status, hypotension and lactate of 6 with WBC count of 19.6. He required fluids and Neo-Synephrine drip.  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 Heath Massey).  Transferred back to ICU 6/9, intubated for resp distress. Code status reversed by family.  SUBJECTIVE:  TF off. [place]  VITAL SIGNS: BP (!) 114/53 (BP Location: Right Leg)   Pulse 69   Temp 99.9 F (37.7 C) (Axillary)   Resp 18   Ht 5\' 6"  (1.676 m)   Wt 160 lb 11.5 oz (72.9 kg)   SpO2 100%   BMI 25.94 kg/m   HEMODYNAMICS:   VENTILATOR SETTINGS: Vent Mode: PSV;CPAP FiO2 (%):  [30 %] 30 % Set Rate:  [16 bmp] 16 bmp Vt Set:  [510 mL] 510 mL PEEP:  [5 cmH20] 5 cmH20 Pressure Support:  [5 cmH20-10 cmH20] 5 cmH20 Plateau Pressure:  [15 cmH20] 15 cmH20  INTAKE / OUTPUT: I/O last 3 completed shifts: In: 3442.5 [I.V.:1897.5; NG/GT:1045; IV [id number]:354] Out: 2265 [Urine:2265]  PHYSICAL EXAMINATION: General:  Contracted, frail elderly AAM on vent HEENT: OTT-> vent PSY:non responsive Neuro: wds to pain CV: HSD PULM: coarse rhonchi bilaterally TG:YBWL, non-tender, bsx4 active , TF stopped per family request Extremities: warm/dry, +++ edema , contracted Skin: warm     LABS:  BMET  Recent Labs Lab 04/17/17 0132 04/19/17 1130 04/21/17 0312  NA 139 134* 134*  K 3.8 3.3* 3.9  CL 112* 106 107  CO2 20* 22 21*  BUN 20 17 20   CREATININE 1.09 1.15 1.10  GLUCOSE 120* 138* 87    Electrolytes  Recent Labs Lab 04/15/17 0331  04/16/17 0547 04/17/17 0132 04/19/17 1130 04/21/17 0312  CALCIUM 7.1* 7.5* 7.8* 7.9* 7.5*  MG 1.7 1.8  --   --  1.8  PHOS 1.9* 2.6 2.4*  --  3.2    CBC  Recent Labs Lab 04/17/17 0132 04/19/17 1130 04/21/17 0312  WBC 5.6 17.0* 12.6*  HGB 10.2* 8.4* 8.4*  HCT 30.4* 24.3* 24.8*  PLT 86* 96* 130*    Coag's No results for input(s): APTT, INR in the last 168 hours.  Sepsis Markers No results for input(s): LATICACIDVEN, PROCALCITON, O2SATVEN in the last 168 hours.  ABG  Recent Labs Lab 04/17/17 0235  PHART 7.471*  PCO2ART 24.2*  PO2ART 95.6    Liver Enzymes No results for input(s): AST, ALT, ALKPHOS, BILITOT, ALBUMIN in the last 168 hours.  Cardiac Enzymes No results for input(s): TROPONINI, PROBNP in the last 168 hours.  Glucose  Recent Labs Lab 04/20/17 1226 04/20/17 1700 04/20/17 2018 04/21/17 0006 04/21/17 0401 04/21/17 0756  GLUCAP 95 100* 83 86 81 69    Imaging Dg Chest Port 1 View  Result Date: 04/21/2017 CLINICAL DATA:  Respiratory failure. EXAM: PORTABLE CHEST 1 VIEW COMPARISON:  04/20/2017 FINDINGS: The patient is rotated to the right. The endotracheal tube is approximately 2 cm above the carina. Enteric tube courses into the left upper abdomen with tip not imaged. The cardiomediastinal silhouette is unchanged. Right greater than left perihilar and  bibasilar airspace opacities have mildly increased. There may be small bilateral pleural effusions. No definite pneumothorax is identified, although the patient's chin obscures the right lung apex. IMPRESSION: Mild worsening of bilateral airspace disease. Electronically Signed   By: Logan Bores M.D.   On: 04/21/2017 07:22   Dg Chest Port 1 View  Result Date: 04/20/2017 CLINICAL DATA:  Endotracheal tube evaluation. EXAM: PORTABLE CHEST 1 VIEW COMPARISON:  04/20/2017 and prior radiographs FINDINGS: The cardiomediastinal silhouette is unchanged. An endotracheal tube with tip 2.5 cm above the carina. An NG  tube is identified entering the stomach with tip off the field of view. Bilateral lower lung airspace disease/consolidation again noted. There is no evidence of pneumothorax. IMPRESSION: Unchanged appearance of the chest. Electronically Signed   By: Margarette Canada M.D.   On: 04/20/2017 18:06   STUDIES:  CXR 6/9 >> worsening bilateral opacities likely pleural effusion with pneumonia. 6/14 CxR>> worse asdz bilateral  CULTURES: BCx2 6/5 >> enterococcus faecalis (S-vanco), providencia stuartii (S-cefepime) C-Diff 6/5 >> antigen positive, toxin neg BCx2 6/7 >> neg UC 6/9 >> negative   ANTIBIOTICS: Cefepime  6/5 >> 6/7 Ceftx 6/7 >> 6/10 Levaquin 6/5 >> 6/5 Vancomycin 6/5 >> Cefepime 6/6 >>  Metronidazole 6/6 >>6/11   SIGNIFICANT EVENTS: 5/17 > 5/21 admit for urinary retention 6/05  Admit for sepsis 6/09  Rapid response. Intubated 6/10  Maintain on the vent overnight.  Soft BP but not on pressors 6/12  Weaning on PSV 5/5 6/13 palliative care consult 6/13 TF stopped per family request  LINES/TUBES: ETT 6/9 >>  DISCUSSION: 83 year old male with dementia and recent admit for urinary retention presenting with septic shock-enterococcal + providencia bacteremia, likely source being UTI.  C-diff antigen positive, toxin negative > likely colonization-prior CT scan had shown proctocolitis.  Back to ICU 6/9, intubated after respiratory distress due to presumed aspiration.  ASSESSMENT / PLAN:  RESPIRATORY  A: Acute Hypoxic Respiratory Failure - in setting of suspected aspiration  Possible Aspiration PNA Layering Bilateral Effusions P: PRVC 8 cc/kg Wean PEEP / FiO2 for sats > 92% Intermittent CXR Daily SBT / WUA  Monitor effusions  Not weanable 6/14  CARDIOVASCULAR A:  Septic Shock - resolved Limited IV Access P:  ICU monitoring  See ID  LCB > no CPR in the event of arrest  Unable to place PICC, unable to draw labs > daughter ok with peripheral pressors but does not want central  access.  RENAL Lab Results  Component Value Date   CREATININE 1.10 04/21/2017   CREATININE 1.15 04/19/2017   CREATININE 1.09 04/17/2017    Recent Labs Lab 04/17/17 0132 04/19/17 1130 04/21/17 0312  K 3.8 3.3* 3.9     A:   AKI - resolved Hypophosphatemia BPH - urinary retention with indwelling foley P:   Trend BMP / urinary output Replace electrolytes as indicated Avoid nephrotoxic agents, ensure adequate renal perfusion Maintain foley catheter  Continue proscar, flomax  GASTROINTESTINAL A:   Mild protein calorie malnutrition Dysphagia worse compared to 2 wks ago - due to acute illness NG advanced 6/13 5 cm P:   Pepcid for SUP  NPO OGT TF on hold Abd film to confirm placement 6/13 which shows loop in stomach. May need Cortrak if TF resumed  HEMATOLOGIC  Recent Labs  04/19/17 1130 04/21/17 0312  HGB 8.4* 8.4*    A:   Anemia of chronic disease  (hgb close to baseline) Thrombocytopenia 86->96->130 P:  Trend CBC  SCD's for DVT prophylaxis  Monitor for bleeding   INFECTIOUS A:   Sepsis - secondary to recurrent UTI with indwelling foley.  LLL Opacity on CXR - favor atx C-Diff - favor colonization  Now with new aspiration, HCAP P:   ABX as above, D10/x  Abx per ID Follow cultures to maturity    ENDOCRINE CBG (last 3)   Recent Labs  04/21/17 0006 04/21/17 0401 04/21/17 0756  GLUCAP 86 81 69  TSH 6.5 03/26/17   A:   Hypoglycemia Hypothyroidism  P:   TF off per family request D5ns for low glucose Monitor glucose on BMP  Continue [place]   NEUROLOGIC A:   Acute metabolic encephalopathy Alzheimer's dementia - baseline alert / responsive prior to admit, daughter hopeful to get him back to prior baseline P:   RASS goal: 0 to -1  Fentanyl PRN for pain / sedation  Noted to have neck and ext contractures  Palliative care spoke with daughter 6/13. Note palliative care has been involved multiple times. Tube feeds stopped per family request  6/14.    FAMILY  - Updates: Daughter updated via phone 6/13 per Dr. Vaughan Browner . Palliative spoke with her on phone 6/13.     CC Time: 30 minutes   Richardson Landry Elsia Lasota ACNP Maryanna Shape PCCM Pager [phone] till 3 pm If no answer page [phone] 04/21/2017, 8:27 AM

## 2017-04-21 NOTE — Progress Notes (Signed)
NUTRITION NOTE  Pt seen for full follow-up by this RD yesterday morning. OGT in place and pt ordered Osmolite 1.2 @ 50 mL/hr with 30 mL Prostat once/day. Reviewed RN note from yesterday at 11:20 AM which states daughter's request for TF to be turned off; TF remains off since that time. Reviewed Palliative Care's notes from yesterday and this AM and will continue to monitor for [place]. Reviewed PCCM NP note from this AM stating OGT is looped in pt's stomach and that should TF be restarted, plan for Cortrak placement. Cortrak service only available at [place]; recommend small bore NGT be placed by IR should tube be replaced.    Jarome Matin, MS, RD, LDN, [facility] Inpatient Clinical Dietitian Pager # [phone] After hours/weekend pager # [phone]

## 2017-04-22 ENCOUNTER — Inpatient Hospital Stay (HOSPITAL_COMMUNITY): Payer: Medicare Other

## 2017-04-22 LAB — CBC
HCT: 22 % — ABNORMAL LOW (ref 39.0–52.0)
Hemoglobin: 7.6 g/dL — ABNORMAL LOW (ref 13.0–17.0)
MCH: 30 pg (ref 26.0–34.0)
MCHC: 34.5 g/dL (ref 30.0–36.0)
MCV: 87 fL (ref 78.0–100.0)
PLATELETS: 164 10*3/uL (ref 150–400)
RBC: 2.53 MIL/uL — AB (ref 4.22–5.81)
RDW: 14.3 % (ref 11.5–15.5)
WBC: 12.8 10*3/uL — AB (ref 4.0–10.5)

## 2017-04-22 LAB — MAGNESIUM
Magnesium: 1.6 mg/dL — ABNORMAL LOW (ref 1.7–2.4)
Magnesium: 1.8 mg/dL (ref 1.7–2.4)

## 2017-04-22 LAB — BASIC METABOLIC PANEL
ANION GAP: 5 (ref 5–15)
BUN: 19 mg/dL (ref 6–20)
CHLORIDE: 107 mmol/L (ref 101–111)
CO2: 22 mmol/L (ref 22–32)
Calcium: 7.6 mg/dL — ABNORMAL LOW (ref 8.9–10.3)
Creatinine, Ser: 1.13 mg/dL (ref 0.61–1.24)
GFR calc Af Amer: >60 mL/min (ref >60)
GFR, EST NON AFRICAN AMERICAN: 57 mL/min — AB (ref >60)
GLUCOSE: 102 mg/dL — AB (ref 65–99)
POTASSIUM: 3.5 mmol/L (ref 3.5–5.1)
Sodium: 134 mmol/L — ABNORMAL LOW (ref 135–145)

## 2017-04-22 LAB — GLUCOSE, CAPILLARY
GLUCOSE-CAPILLARY: 114 mg/dL — AB (ref 65–99)
Glucose-Capillary: 110 mg/dL — ABNORMAL HIGH (ref 65–99)
Glucose-Capillary: 92 mg/dL (ref 65–99)
Glucose-Capillary: 100 mg/dL — ABNORMAL HIGH (ref 65–99)
Glucose-Capillary: 106 mg/dL — ABNORMAL HIGH (ref 65–99)
Glucose-Capillary: 100 mg/dL — ABNORMAL HIGH (ref 65–99)

## 2017-04-22 LAB — PHOSPHORUS
PHOSPHORUS: 3.1 mg/dL (ref 2.5–4.6)
PHOSPHORUS: 2.7 mg/dL (ref 2.5–4.6)

## 2017-04-22 MED ORDER — VITAL HIGH PROTEIN PO LIQD
1000.0000 mL | ORAL | Status: DC
  Administered 2017-04-22: 1000 mL
  Filled 2017-04-22: qty 1000

## 2017-04-22 MED ORDER — PRO-STAT SUGAR FREE PO LIQD: 30.0000 mL | Freq: Two times a day (BID) | ORAL | Status: DC

## 2017-04-22 MED ORDER — POTASSIUM CHLORIDE 20 MEQ/15ML (10%) PO SOLN
40.0000 meq | Freq: Once | ORAL | Status: AC
  Administered 2017-04-22: 40 meq
  Filled 2017-04-22: qty 30

## 2017-04-22 MED ORDER — OSMOLITE 1.2 CAL PO LIQD
1000.0000 mL | ORAL | Status: DC
  Administered 2017-04-22 – 2017-04-25 (×4): 1000 mL

## 2017-04-22 NOTE — Progress Notes (Signed)
Pharmacy Antibiotic Note  Erik Massey is a 83 y.o. male admitted on 04/12/2017 from nursing home with sepsis. Patient was started on vancomycin, levaquin and cefepime on admission.  Providencia stuartii and enterococcus noted in bcx with ID de-escalated abx to vancomycin and ceftriaxone.  Patient went into respiratory distress (6/10) and is now re-intubated. Pharmacy is currently consulted to dose Vancomycin and Cefepime for UTI, Bacteremia, and HCAP.    Today, 04/22/2017: Tm 101.5 F Scr stable 1.15> 1.10>1.13 with CrCl ~ 43 ml/min WBC trending down 5.6 > 17 > 12.6 > 12.8  Plan:  Continue Cefepime 2g IV q24h  Continue Vancomycin 500 mg IV q12h.  Follow up renal fxn, culture results, and clinical course.  _____________________________________  Height: 5\' 6"  (167.6 cm) Weight: 154 lb 5.2 oz (70 kg) IBW/kg (Calculated) : 63.8  Temp (24hrs), Avg:100.3 F (37.9 C), Min:98.9 F (37.2 C), Max:102 F (38.9 C)   Recent Labs Lab 04/16/17 0547 04/16/17 1043 04/17/17 0132 04/19/17 1130 04/19/17 1303 04/21/17 0312 04/22/17 0309  WBC 9.1  --  5.6 17.0*  --  12.6* 12.8*  CREATININE 1.02  --  1.09 1.15  --  1.10 1.13  VANCOTROUGH  --  23*  --   --  22*  --   --     Estimated Creatinine Clearance: 43.1 mL/min (by C-G formula based on SCr of 1.13 mg/dL).    Allergies  Allergen Reactions  . Penicillins Swelling    Has patient had a PCN reaction causing immediate rash, facial/tongue/throat swelling, SOB or lightheadedness with hypotension: yes Has patient had a PCN reaction causing severe rash involving mucus membranes or skin necrosis: no Has patient had a PCN reaction that required hospitalization- yes already in the hospital Has patient had a PCN reaction occurring within the last 10 years: No If all of the above answers are "NO", then may proceed with Cephalosporin use.    Antimicrobials this admission:  6/5 cefepime>>6/8>> resume 6/10>> 6/5 vancomycin>> 6/7 CTX>>  6/10 6/5 levofloxacin >> 6/6 6/6 PO vanc >>6/7 6/6 IV flagyl >>6/7>> resume 6/10>> 6/11  Dose adjustments this admission:  6/6 increase vancomycin from 500mg  IV q24h to 750mg  q24h for improved SCr 6/7 increase vancomycin 750 mg q12h for improved SCr 6/8 VT at 1149 (per RN) = 19  (on 750 mg q12h) 6/9 VT= 23 (on 750mg  IV q12h) -- reduced to 500 mg q12h 6/11 VT = (canceled, unable to obtain blood for labs) 6/12 1130 VT = 22.  Drawn ~4 hours early (last dose at 0330 with next dose due at 1600) d/t inability to obtain blood by lab and only PCCM able to get labs.  Extrapolated true trough level ~ 18.5.  Continue same dose.   Microbiology results:  6/5 BCx at 1850: 1/2 Providencia stuartii (S= cefe, ceftaz, CTX, imi, zosyn) (nothing on BCID) 6/5 BCx at 2220: 1/2 Enterococcus faecalis (S= amp, vanc) (BCID=enterococcus no resistance) FINAL 6/5 C.Diff Ag/PCR: POSITIVE, Toxin: NEGATIVE (no stools per RN but tx since WBC elevated per MD, = Colonization)  6/6 UCx: multiple species, need recollection FINAL 6/6 MRSA PCR: neg 6/7 bcx x2: ngtd 6/9 ucx: NGF 6/14 urine: sent 6/14: Trach aspirate: abundant Gr- rods 6/14: BC: sent   Thank you for allowing pharmacy to be a part of this patient's care.   Royetta Asal, PharmD, BCPS Pager [phone] 04/22/2017 9:53 AM

## 2017-04-22 NOTE — Progress Notes (Signed)
NUTRITION NOTE  Full follow-up note done 6/13 by this RD with brief note yesterday. Pt remains intubated with OGT in place. Reviewed Brandi's, PCCM NP, note from this AM and also talked with her about pt. Plan is to keep TF off per family's request. TF order remains in place at this time: Osmolite 1.2 @ 50 mL/hr with 30 mL Prostat once/day. RD will continue to monitor POC/GOC. Pt is currently a partial code.   Medications reviewed; 20 mg IV Pepcid BID, 20 mg IV Lasix x1 dose yesterday, 400 mg Mag-ox per OGT/day, 15 mL liquid multivitamin per OGT/day, 40 mEq KCl per OGT x1 dose today. Labs reviewed; CBGs: 99, 110, 92 mg/dL this AM, Na: 134 mmol/L, Ca: 7.6 mg/dL.  IVF: D5-NS @ 50 mL/hr (204 kcal from dextrose).    Jarome Matin, MS, RD, LDN, [facility] Inpatient Clinical Dietitian Pager # [phone] After hours/weekend pager # [phone]

## 2017-04-22 NOTE — Progress Notes (Signed)
Daughter updated via phone per Dr. Vaughan Browner.  She is now ok with resuming TF.  Will restart TF per Nutrition.    Noe Gens, NP-C [facility] Pgr: [phone] or if no answer [phone] 04/22/2017, 12:54 PM

## 2017-04-22 NOTE — Progress Notes (Signed)
Nutrition Follow-up  DOCUMENTATION CODES:   Not applicable  INTERVENTION:  - Will order Osmolite 1.2 @ 20 mL/hr with 10 mL advancement every 8 hours to reach goal rate of Osmolite 1.2 @ 60 mL/hr. At goal rate, this regimen will provide 1728 kcal, 80 grams of protein, 1181 mL free water. - Will continue to monitor for POC/GOC.  NUTRITION DIAGNOSIS:   Inadequate oral intake related to inability to eat as evidenced by NPO status. -ongoing  GOAL:   Patient will meet greater than or equal to 90% of their needs -unable to meet with TF previously off.   MONITOR:   Vent status, TF tolerance, Weight trends, Labs, I & O's  REASON FOR ASSESSMENT:   Ventilator, Consult Enteral/tube feeding initiation and management  ASSESSMENT:   83 y.o. male with history of dementia, hypothyroidism, seizure disorder who was recently admitted for abdominal pain and had CT scan showing left upper quadrant mass. As per the patient's daughter, patient was doing fine until 2 days ago when patient became increasingly lethargic weak and more bed bound. Patient was brought to the occupational getting hypotensive and lethargic and increasing hematuria. Pt intubated early AM 6/10 d/t respiratory failure following aspiration.  6/15 follow-up note done 6/13 by this RD with brief note yesterday. Pt remains intubated with OGT in place. Reviewed Brandi's, PCCM NP, note from this AM and also talked with her about pt. Plan is to keep TF off per family's request. TF order remains in place at this time: Osmolite 1.2 @ 50 mL/hr with 30 mL Prostat once/day. RD will continue to monitor POC/GOC. Pt is currently a partial code.   ADDENDUM: Consult received as family would like TF to be restarted today. Pt has OGT in place. Spoke with RN who confirms plan to restart TF today. Will adjust TF ordered as outlined above. Pt had a BM today.   Patient is currently intubated on ventilator support MV: 8.7 L/min Temp (24hrs), Avg:99.8 F  (37.7 C), Min:98.9 F (37.2 C), Max:101.5 F (38.6 C) BP: 160/54 and MAP: 88  Medications reviewed; 400 mg-ox per OGT/day, 15 mL liquid multivitamin per OGT/day, 40 mEq per OGT x1 dose today. Labs reviewed; CBGs: 110, 92, and 100 mg/dL today, Na: 134 mmol/L, Ca: 7.6 mg/dL, Mg: 1.6 mg/dL.  IVF: D5-NS @ 50 mL/hr (204 kcal from dextrose).    6/14 - Reviewed RN note from yesterday at 11:20 AM which states daughter's request for TF to be turned off; TF remains off since that time.  - Reviewed Palliative Care's notes from yesterday and this AM and will continue to monitor for [place].  - Reviewed PCCM NP note from this AM stating OGT is looped in pt's stomach and that should TF be restarted, plan for Cortrak placement. Cortrak service only available at [facility]; recommend small bore NGT be placed by IR should tube be replaced.   6/13 - Pt remains intubated with OGT in place and is receiving Osmolite 1.2 @ 50 mL/hr with 30 mL Prostat once/day and 30 mL free water QID.  - This regimen is providing 1540 kcal (87% re-estimated kcal need), 82 grams of protein, and 1104 mL free water.  - Weight +0.9 kg from yesterday and doc flowsheet indicates moderate-very deep edema found in areas throughout the body.  - Spoke with RN who reports pt mainly unchanged since yesterday, remains unarousable.  - Per PCCM NP note this AM, unable to place PICC, avoid nephrotoxic agents, ensure adequate renal perfusion, OGT advanced  5 cm this AM. - Still no BM since 6/6.  Patient is currently intubated on ventilator support MV: 12.3 L/min Temp (24hrs), Avg:100.1 F (37.8 C), Min:99.2 F (37.3 C), Max:101.2 F (38.4 C) BP: 145/51 and MAP: 86 IVF: NS @ 50 mL/hr.     Diet Order:   NPO  Skin:  Reviewed, no issues  Last BM:  6/15  Height:   Ht Readings from Last 1 Encounters:  04/18/17 5\' 6"  (1.676 m)    Weight:   Wt Readings from Last 1 Encounters:  04/22/17 154 lb 5.2 oz (70 kg)    Ideal Body  Weight:  53.64 kg  BMI:  Body mass index is 24.91 kg/m.  Estimated Nutritional Needs:   Kcal:  1700  Protein:  72-89 grams (1.2-1.5 grams/kg)  Fluid:  >/= 1.4 L/day  EDUCATION NEEDS:   No education needs identified at this time     Jarome Matin, MS, RD, LDN, CNSC Inpatient Clinical Dietitian Pager # [phone] After hours/weekend pager # [phone]

## 2017-04-22 NOTE — Progress Notes (Signed)
CSW following to assist with d/c planning. Pt is from [facility]. Vent support required. RNCM is assisting with possible LTAC placement.  Werner Lean LCSW [phone]

## 2017-04-22 NOTE — Progress Notes (Addendum)
Patient ID: Erik Massey, male   DOB: 1928/12/22, 83 y.o.   MRN: [id number]         [facility] for Infectious Disease  Date of Admission:  04/12/2017           Day 10 vancomycin        Day 6 cefepime         Principal Problem:   HCAP (healthcare-associated pneumonia) Active Problems:   UTI (urinary tract infection)   Sepsis ([place])   Enterococcal bacteremia   Gram-negative bacteremia   Seizure (HCC)   ARF (acute renal failure) (HCC)   Normochromic normocytic anemia   Diarrhea   Dementia   Thrombocytopenia (HCC)   Moderate protein-calorie malnutrition ([name])   Sebaceous cyst   Respiratory failure ([place])   . betaxolol  1 drop Right Eye Daily  . carBAMazepine  200 mg Per Tube QID  . chlorhexidine gluconate (MEDLINE KIT)  15 mL Mouth Rinse BID  . dorzolamide  1 drop Right Eye Q12H  . feeding supplement (PRO-STAT SUGAR FREE 64)  30 mL Per Tube Daily  . finasteride  5 mg Oral Daily  . latanoprost  1 drop Right Eye QHS  . magnesium oxide  400 mg Per Tube Daily  . mouth rinse  15 mL Mouth Rinse QID  . multivitamin  15 mL Per Tube Daily  . tamsulosin  0.4 mg Oral Daily  . thyroid  90 mg Per Tube Daily   Review of Systems: Review of Systems  Unable to perform ROS: Intubated    Past Medical History:  Diagnosis Date  . Alzheimer disease   . Seizures ([place])     Social History  Substance Use Topics  . Smoking status: Never Smoker  . Smokeless tobacco: Never Used  . Alcohol use No    Family History  Problem Relation Age of Onset  . Family history unknown: Yes   Allergies  Allergen Reactions  . Penicillins Swelling    Has patient had a PCN reaction causing immediate rash, facial/tongue/throat swelling, SOB or lightheadedness with hypotension: yes Has patient had a PCN reaction causing severe rash involving mucus membranes or skin necrosis: no Has patient had a PCN reaction that required hospitalization- yes already in the hospital Has patient had a PCN  reaction occurring within the last 10 years: No If all of the above answers are "NO", then may proceed with Cephalosporin use.     OBJECTIVE: Vitals:   04/22/17 0900 04/22/17 1000 04/22/17 1100 04/22/17 1156  BP: (!) 143/53 (!) 181/94 (!) 203/73   Pulse:      Resp: (!) 21 (!) 25 (!) 33   Temp:    99.8 F (37.7 C)  TempSrc:    Oral  SpO2: 100% 100% 100%   Weight:      Height:       Body mass index is 24.91 kg/m.  Physical Exam  Constitutional:  He remains poorly responsive on the ventilator.   Cardiovascular: Normal rate and regular rhythm.   No murmur heard. Pulmonary/Chest: He has no wheezes.  Diminished breath sounds on the right.  Abdominal: Soft.  No diarrhea.  Genitourinary:  Genitourinary Comments: Penile and scrotal edema.  Musculoskeletal: He exhibits edema.    Lab Results Lab Results  Component Value Date   WBC 12.8 (H) 04/22/2017   HGB 7.6 (L) 04/22/2017   HCT 22.0 (L) 04/22/2017   MCV 87.0 04/22/2017   PLT 164 04/22/2017    Lab Results  Component  Value Date   CREATININE 1.13 04/22/2017   BUN 19 04/22/2017   NA 134 (L) 04/22/2017   K 3.5 04/22/2017   CL 107 04/22/2017   CO2 22 04/22/2017    Lab Results  Component Value Date   ALT 72 (H) 04/13/2017   AST 115 (H) 04/13/2017   ALKPHOS 87 04/13/2017   BILITOT 0.5 04/13/2017     Microbiology: Recent Results (from the past 240 hour(s))  C difficile quick scan w PCR reflex     Status: Abnormal   Collection Time: 04/12/17  5:33 PM  Result Value Ref Range Status   C Diff antigen POSITIVE (A) NEGATIVE Final   C Diff toxin NEGATIVE NEGATIVE Final   C Diff interpretation Results are indeterminate. See PCR results.  Final  Clostridium Difficile by PCR     Status: Abnormal   Collection Time: 04/12/17  5:33 PM  Result Value Ref Range Status   Toxigenic C Difficile by pcr POSITIVE (A) NEGATIVE Final    Comment: Positive for toxigenic C. difficile with little to no toxin production. Only treat if  clinical presentation suggests symptomatic illness.  Blood Culture (routine x 2)     Status: Abnormal   Collection Time: 04/12/17  6:50 PM  Result Value Ref Range Status   Specimen Description BLOOD LEFT HAND  Final   Special Requests IN PEDIATRIC BOTTLE Blood Culture adequate volume  Final   Culture  Setup Time   Final    GRAM NEGATIVE RODS IN PEDIATRIC BOTTLE CRITICAL RESULT CALLED TO, READ BACK BY AND VERIFIED WITH: A. PHAM PHARMD, AT 1518 04/13/17 BY Rush Landmark Performed at [facility], [place] [address] [place], [place] [address]    Culture PROVIDENCIA STUARTII (A)  Final   Report Status 04/15/2017 FINAL  Final   Organism ID, Bacteria PROVIDENCIA STUARTII  Final      Susceptibility   Providencia stuartii - MIC*    AMPICILLIN >=32 RESISTANT Resistant     CEFAZOLIN >=64 RESISTANT Resistant     CEFEPIME <=1 SENSITIVE Sensitive     CEFTAZIDIME <=1 SENSITIVE Sensitive     CEFTRIAXONE <=1 SENSITIVE Sensitive     CIPROFLOXACIN >=4 RESISTANT Resistant     GENTAMICIN RESISTANT Resistant     IMIPENEM 1 SENSITIVE Sensitive     TRIMETH/SULFA >=320 RESISTANT Resistant     AMPICILLIN/SULBACTAM >=32 RESISTANT Resistant     PIP/TAZO <=4 SENSITIVE Sensitive     * PROVIDENCIA STUARTII  Blood Culture ID Panel (Reflexed)     Status: None   Collection Time: 04/12/17  6:50 PM  Result Value Ref Range Status   Enterococcus species NOT DETECTED NOT DETECTED Final   Listeria monocytogenes NOT DETECTED NOT DETECTED Final   Staphylococcus species NOT DETECTED NOT DETECTED Final   Staphylococcus aureus NOT DETECTED NOT DETECTED Final   Streptococcus species NOT DETECTED NOT DETECTED Final   Streptococcus agalactiae NOT DETECTED NOT DETECTED Final   Streptococcus pneumoniae NOT DETECTED NOT DETECTED Final   Streptococcus pyogenes NOT DETECTED NOT DETECTED Final   Acinetobacter baumannii NOT DETECTED NOT DETECTED Final   Enterobacteriaceae species NOT DETECTED NOT DETECTED Final   Enterobacter  cloacae complex NOT DETECTED NOT DETECTED Final   Escherichia coli NOT DETECTED NOT DETECTED Final   Klebsiella oxytoca NOT DETECTED NOT DETECTED Final   Klebsiella pneumoniae NOT DETECTED NOT DETECTED Final   Proteus species NOT DETECTED NOT DETECTED Final   Serratia marcescens NOT DETECTED NOT DETECTED Final   Haemophilus influenzae NOT  DETECTED NOT DETECTED Final   Neisseria meningitidis NOT DETECTED NOT DETECTED Final   Pseudomonas aeruginosa NOT DETECTED NOT DETECTED Final   Candida albicans NOT DETECTED NOT DETECTED Final   Candida glabrata NOT DETECTED NOT DETECTED Final   Candida krusei NOT DETECTED NOT DETECTED Final   Candida parapsilosis NOT DETECTED NOT DETECTED Final   Candida tropicalis NOT DETECTED NOT DETECTED Final    Comment: Performed at [facility], [place] [address] [place], [place] [address]  Blood Culture (routine x 2)     Status: Abnormal   Collection Time: 04/12/17 10:20 PM  Result Value Ref Range Status   Specimen Description BLOOD BLOOD RIGHT FOREARM  Final   Special Requests   Final    BOTTLES DRAWN AEROBIC AND ANAEROBIC Blood Culture adequate volume   Culture  Setup Time   Final    GRAM POSITIVE COCCI IN PAIRS IN BOTH AEROBIC AND ANAEROBIC BOTTLES CRITICAL RESULT CALLED TO, READ BACK BY AND VERIFIED WITH: D. ZIEGLER, RPHARMD ([place]) AT 1800 ON 04/13/17 BY C. JESSUP, MLT. Performed at [facility], [place] [address] [place], [place] [address]    Culture ENTEROCOCCUS FAECALIS (A)  Final   Report Status 04/15/2017 FINAL  Final   Organism ID, Bacteria ENTEROCOCCUS FAECALIS  Final      Susceptibility   Enterococcus faecalis - MIC*    AMPICILLIN <=2 SENSITIVE Sensitive     VANCOMYCIN 4 SENSITIVE Sensitive     GENTAMICIN SYNERGY RESISTANT Resistant     * ENTEROCOCCUS FAECALIS  Blood Culture ID Panel (Reflexed)     Status: Abnormal   Collection Time: 04/12/17 10:20 PM  Result Value Ref Range Status   Enterococcus species DETECTED (A) NOT DETECTED Final      Comment: CRITICAL RESULT CALLED TO, READ BACK BY AND VERIFIED WITH: D. ZIEGLER, RPHARMD AT 1800 ON 04/13/17 BY C. JESSUP, MLT.    Vancomycin resistance NOT DETECTED NOT DETECTED Final   Listeria monocytogenes NOT DETECTED NOT DETECTED Final   Staphylococcus species NOT DETECTED NOT DETECTED Final   Staphylococcus aureus NOT DETECTED NOT DETECTED Final   Streptococcus species NOT DETECTED NOT DETECTED Final   Streptococcus agalactiae NOT DETECTED NOT DETECTED Final   Streptococcus pneumoniae NOT DETECTED NOT DETECTED Final   Streptococcus pyogenes NOT DETECTED NOT DETECTED Final   Acinetobacter baumannii NOT DETECTED NOT DETECTED Final   Enterobacteriaceae species NOT DETECTED NOT DETECTED Final   Enterobacter cloacae complex NOT DETECTED NOT DETECTED Final   Escherichia coli NOT DETECTED NOT DETECTED Final   Klebsiella oxytoca NOT DETECTED NOT DETECTED Final   Klebsiella pneumoniae NOT DETECTED NOT DETECTED Final   Proteus species NOT DETECTED NOT DETECTED Final   Serratia marcescens NOT DETECTED NOT DETECTED Final   Haemophilus influenzae NOT DETECTED NOT DETECTED Final   Neisseria meningitidis NOT DETECTED NOT DETECTED Final   Pseudomonas aeruginosa NOT DETECTED NOT DETECTED Final   Candida albicans NOT DETECTED NOT DETECTED Final   Candida glabrata NOT DETECTED NOT DETECTED Final   Candida krusei NOT DETECTED NOT DETECTED Final   Candida parapsilosis NOT DETECTED NOT DETECTED Final   Candida tropicalis NOT DETECTED NOT DETECTED Final    Comment: Performed at [facility], [place] [address], [place], [place] [address]  MRSA PCR Screening     Status: None   Collection Time: 04/13/17 12:01 AM  Result Value Ref Range Status   MRSA by PCR NEGATIVE NEGATIVE Final    Comment:  The GeneXpert MRSA Assay (FDA approved for NASAL specimens only), is one component of a comprehensive MRSA colonization surveillance program. It is not intended to diagnose MRSA infection nor to  guide or monitor treatment for MRSA infections.   Culture, Urine     Status: Abnormal   Collection Time: 04/13/17  1:37 AM  Result Value Ref Range Status   Specimen Description URINE, RANDOM  Final   Special Requests NONE  Final   Culture MULTIPLE SPECIES PRESENT, SUGGEST RECOLLECTION (A)  Final   Report Status 04/14/2017 FINAL  Final  Culture, blood (routine x 2)     Status: None   Collection Time: 04/14/17  4:52 PM  Result Value Ref Range Status   Specimen Description BLOOD RIGHT HAND  Final   Special Requests   Final    BOTTLES DRAWN AEROBIC AND ANAEROBIC Blood Culture adequate volume   Culture   Final    NO GROWTH 5 DAYS Performed at [facility], [place] [address], [place], [place] [address]    Report Status 04/19/2017 FINAL  Final  Culture, blood (routine x 2)     Status: None   Collection Time: 04/14/17  4:52 PM  Result Value Ref Range Status   Specimen Description BLOOD RIGHT HAND  Final   Special Requests   Final    BOTTLES DRAWN AEROBIC AND ANAEROBIC Blood Culture adequate volume   Culture   Final    NO GROWTH 5 DAYS Performed at [facility], [place] [address], [place], [place] [address]    Report Status 04/19/2017 FINAL  Final  Urine Culture     Status: None   Collection Time: 04/16/17 12:36 PM  Result Value Ref Range Status   Specimen Description URINE, RANDOM  Final   Special Requests NONE  Final   Culture   Final    NO GROWTH Performed at [facility], [place] [address] [place], [place] [address]    Report Status 04/17/2017 FINAL  Final  Culture, blood (Routine X 2) w Reflex to ID Panel     Status: None (Preliminary result)   Collection Time: 04/21/17  1:00 PM  Result Value Ref Range Status   Specimen Description BLOOD LEFT HAND  Final   Special Requests IN PEDIATRIC BOTTLE BCAV  Final   Culture   Final    NO GROWTH < 24 HOURS Performed at [facility], [place] [address] [place], [place] [address]    Report Status PENDING  Incomplete    Culture, blood (Routine X 2) w Reflex to ID Panel     Status: None (Preliminary result)   Collection Time: 04/21/17  1:15 PM  Result Value Ref Range Status   Specimen Description BLOOD LEFT HAND  Final   Special Requests IN PEDIATRIC BOTTLE Blood Culture adequate volume  Final   Culture   Final    NO GROWTH < 24 HOURS Performed at [facility], [place] [address] [place], [place] [address]    Report Status PENDING  Incomplete  Culture, respiratory (NON-Expectorated)     Status: None (Preliminary result)   Collection Time: 04/21/17  5:12 PM  Result Value Ref Range Status   Specimen Description TRACHEAL ASPIRATE  Final   Special Requests NONE  Final   Gram Stain   Final    FEW WBC PRESENT, PREDOMINANTLY PMN ABUNDANT GRAM NEGATIVE RODS FEW YEAST    Culture   Final    CULTURE REINCUBATED FOR BETTER GROWTH  Performed at [facility], [place] [address] [place], [place] [address]    Report Status PENDING  Incomplete     ASSESSMENT: He has developed healthcare associated pneumonia while on therapy for urinary tract infection and transient enterococcal and Providencia bacteremia. He is also colonized with C. difficile but has no evidence of active colitis. His temperature is down some today. Repeat cultures are pending PLAN: 1. Continue vancomycin and cefepime  2. Please call Dr. Talbot Grumbling [phone]) for any infectious disease questions this weekend. I will have him monitor culture results and make any adjustments necessary  Michel Bickers, MD [facility] for [place] [phone] pager   [phone] cell 04/22/2017, 12:33 PM

## 2017-04-22 NOTE — Progress Notes (Signed)
PULMONARY / CRITICAL CARE MEDICINE   Name: Erik Massey MRN: [id number] DOB: May 15, 1929    ADMISSION DATE:  04/12/2017 CONSULTATION DATE:  6/6  REFERRING MD:  [name]   CHIEF COMPLAINT:  Shock  BRIEF SUMMARY:    83 year old man with dementia & recent admission 5/17-5/21 for urinary retention due to BPH and cystitis, discharged with indwelling Foley to SNF for rehabilitation. He was readmitted 6/5 with altered mental status, hypotension and lactate of 6 with WBC count of 19.6. He required fluids and Neo-Synephrine drip.  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 Heath Massey).  Transferred back to ICU 6/9, intubated for resp distress. Code status reversed by family.  SUBJECTIVE:  No acute events overnight.  Remains on vent > weaning on 5/5.  Tmax 100.1.  Per staff, daughter has requested we not feed the patient as she felt the TF was making him bloated.   VITAL SIGNS: BP (!) 189/86 (BP Location: Right Leg)   Pulse 92   Temp 100.1 F (37.8 C) (Oral)   Resp 18   Ht 5\' 6"  (1.676 m)   Wt 154 lb 5.2 oz (70 kg)   SpO2 97%   BMI 24.91 kg/m   HEMODYNAMICS:   VENTILATOR SETTINGS: Vent Mode: PSV;CPAP FiO2 (%):  [30 %] 30 % Set Rate:  [16 bmp] 16 bmp Vt Set:  [510 mL] 510 mL PEEP:  [5 cmH20] 5 cmH20 Pressure Support:  [5 cmH20] 5 cmH20 Plateau Pressure:  [10 cmH20-17 cmH20] 17 cmH20  INTAKE / OUTPUT: I/O last 3 completed shifts: In: 2390 [I.V.:1750; Other:80; NG/GT:60; IV Piggyback:500] Out: 9741 [Urine:3595]  PHYSICAL EXAMINATION: General:  Chronically ill appearing male in NAD HEENT: MM pink/moist, ETT Neuro: no response to verbal stimuli, minimal movement with physical stimulation CV: s1s2 rrr, no m/r/g PULM: even/non-labored, lungs bilaterally clear anterior, diminished lower  UL:AGTX, non-tender, bsx4 active  Extremities: warm/dry, 2+ generalized edema  Skin: no rashes or  lesions  LABS:  BMET  Recent Labs Lab 04/19/17 1130 04/21/17 0312 04/22/17 0309  NA 134* 134* 134*  K 3.3* 3.9 3.5  CL 106 107 107  CO2 22 21* 22  BUN 17 20 19   CREATININE 1.15 1.10 1.13  GLUCOSE 138* 87 102*    Electrolytes  Recent Labs Lab 04/16/17 0547 04/17/17 0132 04/19/17 1130 04/21/17 0312 04/22/17 0309  CALCIUM 7.5* 7.8* 7.9* 7.5* 7.6*  MG 1.8  --   --  1.8  --   PHOS 2.6 2.4*  --  3.2  --     CBC  Recent Labs Lab 04/19/17 1130 04/21/17 0312 04/22/17 0309  WBC 17.0* 12.6* 12.8*  HGB 8.4* 8.4* 7.6*  HCT 24.3* 24.8* 22.0*  PLT 96* 130* 164    Coag's No results for input(s): APTT, INR in the last 168 hours.  Sepsis Markers No results for input(s): LATICACIDVEN, PROCALCITON, O2SATVEN in the last 168 hours.  ABG  Recent Labs Lab 04/17/17 0235  PHART 7.471*  PCO2ART 24.2*  PO2ART 95.6    Liver Enzymes No results for input(s): AST, ALT, ALKPHOS, BILITOT, ALBUMIN in the last 168 hours.  Cardiac Enzymes No results for input(s): TROPONINI, PROBNP in the last 168 hours.  Glucose  Recent Labs Lab 04/21/17 1136 04/21/17 1513 04/21/17 1933 04/21/17 2309 04/22/17 0348 04/22/17 0729  GLUCAP 84 97 106* 99 110* 92    Imaging Dg Chest Port 1 View  Result Date: 04/22/2017 CLINICAL DATA:  Respiratory failure EXAM: PORTABLE  CHEST 1 VIEW COMPARISON:  04/21/2017 FINDINGS: Cardiac shadow is stable. Endotracheal tube and nasogastric catheter are again seen and stable. Persistent bilateral infiltrates are seen and stable from the prior study. No new focal abnormality is seen. No bony abnormality is noted. IMPRESSION: Stable bilateral infiltrates. Electronically Signed   By: [name] M.D.   On: 04/22/2017 07:16   STUDIES:  CXR 6/9 >> worsening bilateral opacities likely pleural effusion with pneumonia. 6/14 CxR>> worse asdz bilateral  CULTURES: BCx2 6/5 >> enterococcus faecalis (S-vanco), providencia stuartii (S-cefepime) C-Diff 6/5 >>  antigen positive, toxin neg BCx2 6/7 >> neg UC 6/9 >> negative   ANTIBIOTICS: Cefepime  6/5 >> 6/7 Ceftx 6/7 >> 6/10 Levaquin 6/5 >> 6/5 Vancomycin 6/5 >> Cefepime 6/6 >>  Metronidazole 6/6 >> 6/11   SIGNIFICANT EVENTS: 5/17 > 5/21 admit for urinary retention 6/05  Admit for sepsis 6/09  Rapid response. Intubated 6/10  Maintain on the vent overnight.  Soft BP but not on pressors 6/12  Weaning on PSV 5/5 6/13 palliative care consult 6/13 TF stopped per family request  LINES/TUBES: ETT 6/9 >>  DISCUSSION: 83 year old male with dementia and recent admit for urinary retention presenting with septic shock-enterococcal + providencia bacteremia, likely source being UTI.  C-diff antigen positive, toxin negative > likely colonization-prior CT scan had shown proctocolitis.  Back to ICU 6/9, intubated after respiratory distress due to presumed aspiration.  ASSESSMENT / PLAN:  RESPIRATORY  A: Acute Hypoxic Respiratory Failure - in setting of suspected aspiration  Possible Aspiration PNA Layering Bilateral Effusions P: PRVC 8 cc/kg  O2 to support sats > 92% Intermittent CXR Daily SBT Monitor CXR / effusion size  Mental status precludes extubation Need to formalize plan with daughter regarding goals of care but she has been hesitant to discuss  CARDIOVASCULAR A:  Septic Shock - resolved Limited IV Access P:  ICU monitoring  See ID  LCB > no CPR in the event of arrest  Unable to place PICC, difficult to obtain labs > daughter ok with peripheral pressors if needed but does not want central access   RENAL A:   AKI - resolved Hypophosphatemia BPH - urinary retention with indwelling foley P:   Foley catheter for I/O's Trend BMP / urinary output Replace electrolytes as indicated Avoid nephrotoxic agents, ensure adequate renal perfusion Continue proscar, flomax  GASTROINTESTINAL A:   Mild protein calorie malnutrition Dysphagia worse compared to 2 wks ago - due to acute  illness NG advanced 6/13 5 cm P:   Pepcid for SUP  NPO  OGT  TF on hold at family request > concern this is only making his condition worse (malnutrition / anasarca)  HEMATOLOGIC A:   Anemia of chronic disease  (hgb close to baseline) Thrombocytopenia 86->96->130 P:  Trend CBC as able  SCD's for DVT prophylaxis   INFECTIOUS A:   Sepsis - secondary to recurrent UTI with indwelling foley.  LLL Opacity on CXR - favor atx C-Diff - favor colonization  Now with new aspiration, HCAP P:   ABX per ID  Follow cultures  ABX as above, D11/x    ENDOCRINE A:   Hypoglycemia Hypothyroidism  P:   TF off at family request  D5NS with hypoglycemia  Continue Armour  NEUROLOGIC A:   Acute metabolic encephalopathy Alzheimer's dementia - baseline alert / responsive prior to admit, daughter hopeful to get him back to prior baseline Neck / Extremity Contractures  P:   RASS goal: 0 to -1 PRN  Fentanyl for pain / sedation     FAMILY  - Updates: No family available am 6/15.    -  Palliative care spoke with daughter 6/13. Note palliative care has been involved multiple times. Tube feeds stopped per family request 6/14.  CC Time: 30 minutes  Noe Gens, NP-C [facility] Pgr: [phone] or if no answer [phone] 04/22/2017, 8:54 AM

## 2017-04-23 ENCOUNTER — Inpatient Hospital Stay (HOSPITAL_COMMUNITY): Payer: Medicare Other

## 2017-04-23 DIAGNOSIS — J9601 Acute respiratory failure with hypoxia: Secondary | ICD-10-CM

## 2017-04-23 LAB — GLUCOSE, CAPILLARY
GLUCOSE-CAPILLARY: 118 mg/dL — AB (ref 65–99)
GLUCOSE-CAPILLARY: 133 mg/dL — AB (ref 65–99)
GLUCOSE-CAPILLARY: 177 mg/dL — AB (ref 65–99)
GLUCOSE-CAPILLARY: 151 mg/dL — AB (ref 65–99)
Glucose-Capillary: 110 mg/dL — ABNORMAL HIGH (ref 65–99)

## 2017-04-23 LAB — URINE CULTURE
CULTURE: NO GROWTH
SPECIAL REQUESTS: NORMAL

## 2017-04-23 MED ORDER — FUROSEMIDE 10 MG/ML IJ SOLN
40.0000 mg | Freq: Four times a day (QID) | INTRAMUSCULAR | Status: AC
  Administered 2017-04-23 (×2): 40 mg via INTRAVENOUS
  Filled 2017-04-23 (×2): qty 4

## 2017-04-23 MED ORDER — SULFAMETHOXAZOLE-TRIMETHOPRIM 400-80 MG/5ML IV SOLN
350.0000 mg | Freq: Three times a day (TID) | INTRAVENOUS | Status: DC
  Filled 2017-04-23: qty 21.9

## 2017-04-23 MED ORDER — DEXTROSE 5 % IV SOLN
320.0000 mg | Freq: Three times a day (TID) | INTRAVENOUS | Status: DC
  Administered 2017-04-23 – 2017-04-25 (×5): 320 mg via INTRAVENOUS
  Filled 2017-04-23 (×5): qty 20

## 2017-04-23 MED ORDER — SULFAMETHOXAZOLE-TRIMETHOPRIM 400-80 MG/5ML IV SOLN
350.0000 mg | INTRAVENOUS | Status: AC
  Administered 2017-04-23: 350 mg via INTRAVENOUS
  Filled 2017-04-23: qty 21.9

## 2017-04-23 MED ORDER — POTASSIUM CHLORIDE 20 MEQ/15ML (10%) PO SOLN
40.0000 meq | Freq: Two times a day (BID) | ORAL | Status: AC
  Administered 2017-04-23 – 2017-04-24 (×4): 40 meq
  Filled 2017-04-23 (×4): qty 30

## 2017-04-23 NOTE — Progress Notes (Signed)
[facility] Progress Note Patient Name: Erik Massey DOB: Jan 25, 1929 MRN: [id number]   Date of Service  04/23/2017  HPI/Events of Note  Hematuria - not on anticoagulants.   eICU Interventions  Will order: 1. CBC with platelets now. 2. PT/INR, PTT and CBC with Platelets now.      Intervention Category Major Interventions: Other:  Lysle Dingwall 04/23/2017, 4:40 PM

## 2017-04-23 NOTE — Progress Notes (Signed)
Chart reviewed.  The trach aspirate is growing Stenotrophomonas.  I will change the vancomycin to TMP/SMX.

## 2017-04-23 NOTE — Progress Notes (Signed)
Patient had new onset of blood tinged urine with stringy clots. 650 mL of blood tinged urine emptied and [facility] physician notified will continue to monitor.

## 2017-04-23 NOTE — Progress Notes (Signed)
Phlebotomists have attempted to draw AM labs, unable to obtain labs due to patient having poor venous access. RN to communicate to day shift team. Will continue to monitor.

## 2017-04-23 NOTE — Progress Notes (Signed)
PULMONARY / CRITICAL CARE MEDICINE   Name: Erik Massey MRN: [id number] DOB: [birth year]    ADMISSION DATE:  04/12/2017 CONSULTATION DATE:  6/6  REFERRING MD:  [name]   CHIEF COMPLAINT:  Shock  BRIEF SUMMARY:    83 year old man with dementia & recent admission 5/17-5/21 for urinary retention due to BPH and cystitis, discharged with indwelling Foley to SNF for rehabilitation. He was readmitted 6/5 with altered mental status, hypotension and lactate of 6 with WBC count of 19.6. He required fluids and Neo-Synephrine drip.  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).  Transferred back to ICU 6/9, intubated for resp distress. Code status reversed by family.  SUBJECTIVE:   No acute events Can't get labs   VITAL SIGNS: BP (!) 130/46   Pulse 92   Temp 99.2 F (37.3 C) (Oral)   Resp 18   Ht 5\' 6"  (1.676 m)   Wt 155 lb 6.8 oz (70.5 kg)   SpO2 100%   BMI 25.09 kg/m   HEMODYNAMICS:   VENTILATOR SETTINGS: Vent Mode: PSV;CPAP FiO2 (%):  [30 %] 30 % Set Rate:  [16 bmp] 16 bmp Vt Set:  [510 mL] 510 mL PEEP:  [5 cmH20] 5 cmH20 Pressure Support:  [5 cmH20] 5 cmH20 Plateau Pressure:  [7 cmH20-18 cmH20] 10 cmH20  INTAKE / OUTPUT: I/O last 3 completed shifts: In: 2568.7 [I.V.:1800; NG/GT:368.7; IV Piggyback:400] Out: 1600 [Urine:1600]  PHYSICAL EXAMINATION: General: chronically ill appearing HEENT: NCAT ETT in place PULM: CTA B with vent supported breathing CV: RRR, no mgr GI:  BS+, soft Derm: anasarca Neuro: awake but not following commands, not moving extremities well   LABS:  BMET  Recent Labs Lab 04/19/17 1130 04/21/17 0312 04/22/17 0309  NA 134* 134* 134*  K 3.3* 3.9 3.5  CL 106 107 107  CO2 22 21* 22  BUN 17 20 19   CREATININE 1.15 1.10 1.13  GLUCOSE 138* 87 102*    Electrolytes  Recent Labs Lab 04/19/17 1130 04/21/17 0312 04/22/17 0309 04/22/17 1320 04/22/17 1710   CALCIUM 7.9* 7.5* 7.6*  --   --   MG  --  1.8  --  1.6* 1.8  PHOS  --  3.2  --  3.1 2.7    CBC  Recent Labs Lab 04/19/17 1130 04/21/17 0312 04/22/17 0309  WBC 17.0* 12.6* 12.8*  HGB 8.4* 8.4* 7.6*  HCT 24.3* 24.8* 22.0*  PLT 96* 130* 164    Coag's No results for input(s): APTT, INR in the last 168 hours.  Sepsis Markers No results for input(s): LATICACIDVEN, PROCALCITON, O2SATVEN in the last 168 hours.  ABG  Recent Labs Lab 04/17/17 0235  PHART 7.471*  PCO2ART 24.2*  PO2ART 95.6    Liver Enzymes No results for input(s): AST, ALT, ALKPHOS, BILITOT, ALBUMIN in the last 168 hours.  Cardiac Enzymes No results for input(s): TROPONINI, PROBNP in the last 168 hours.  Glucose  Recent Labs Lab 04/22/17 1128 04/22/17 1654 04/22/17 1920 04/22/17 2325 04/23/17 0328 04/23/17 0815  GLUCAP 100* 114* 106* 100* 110* 118*    Imaging No results found. STUDIES:  CXR 6/9 >> worsening bilateral opacities likely pleural effusion with pneumonia. 6/14 CxR>> worse asdz bilateral  CULTURES: BCx2 6/5 >> enterococcus faecalis (S-vanco), providencia stuartii (S-cefepime) C-Diff 6/5 >> antigen positive, toxin neg BCx2 6/7 >> neg UC 6/9 >> negative   ANTIBIOTICS: Cefepime  6/5 >> 6/7 Ceftx 6/7 >> 6/10 Levaquin 6/5 >> 6/5  Vancomycin 6/5 >> Cefepime 6/6 >>  Metronidazole 6/6 >> 6/11   SIGNIFICANT EVENTS: 5/17 > 5/21 admit for urinary retention 6/05  Admit for sepsis 6/09  Rapid response. Intubated 6/10  Maintain on the vent overnight.  Soft BP but not on pressors 6/12  Weaning on PSV 5/5 6/13 palliative care consult 6/13 TF stopped per family request  LINES/TUBES: ETT 6/9 >>  DISCUSSION: 83 year old male with dementia and recent admit for urinary retention presenting with septic shock-enterococcal + providencia bacteremia, likely source being UTI.  C-diff antigen positive, toxin negative > likely colonization-prior CT scan had shown proctocolitis.  Back to ICU  6/9, intubated after respiratory distress due to presumed aspiration.  ASSESSMENT / PLAN:  RESPIRATORY  A: Acute respiratory failure with hypoxemia due to aspiration. Likely acute pulmonary edema and transudative effusions P: Continue full vent support with daily weaning trials VAP prevention Family planning one way extubation in next 24-48 hours Lasix today  CARDIOVASCULAR A:  Septic Shock - resolved Limited IV Access P:  tele   RENAL A:   AKI BPH P:   Continue foley Monitor BMET and UOP Replace electrolytes as needed   GASTROINTESTINAL A:   Protein calorie malnutrition P:   Pepcid for SUP  Continue tube feeding  HEMATOLOGIC A:   Anemia P:  Monitor for bleeding  INFECTIOUS A:   Recent UTI C-Diff - favor colonization  HCAP P:   Abx per ID Monitor cultures   ENDOCRINE A:   Hypoglycemia Hypothyroidism  P:   Continue tube feeding D5NS with hypoglycemia  Continue Armour  NEUROLOGIC A:   Acute encephalopathy Baseline dementia P:   RASS goal: 0 to -1 Minimize sedating meds PAD protocol: intermittent only    FAMILY  - Updates: daughter updated by phone on 6/16, she tells me she is ready for one way extubation by Monday but waiting for her mother to agree to it.  -  Palliative care spoke with daughter 6/13. Note palliative care has been involved multiple times.   My cc time 40 minutes  Roselie Awkward, MD [facility] Pager: [phone] Cell: [phone] After 3pm or if no response, call [phone]

## 2017-04-23 NOTE — Progress Notes (Signed)
Pharmacy Antibiotic Note  Erik Massey is a 83 y.o. male admitted on 04/12/2017 from nursing home with sepsis. Patient was started on vancomycin, levaquin and cefepime on admission.  Providencia stuartii and enterococcus noted in bcx with ID de-escalated abx to vancomycin and ceftriaxone.  Patient went into respiratory distress (6/10) and is now re-intubated. Pharmacy is currently consulted to dose Vancomycin and Cefepime for UTI, Bacteremia, and HCAP.    Vancomycin is being changed to Septra due to Stenotrophomonas  malthophilia growth in trach aspirate.   Today, 04/23/2017: Tm 101.5 F Scr stable 1.15> 1.10>1.13 with CrCl ~ 43 ml/min WBC trending down 5.6 > 17 > 12.6 > 12.8  Plan:  Continue Cefepime 2g IV q24h  Septra 350 mg IV q8h ( 15 mg/kg/day of trimethoprim component)   Follow up renal fxn, culture results, and clinical course.  _____________________________________  Height: 5\' 6"  (167.6 cm) Weight: 155 lb 6.8 oz (70.5 kg) IBW/kg (Calculated) : 63.8  Temp (24hrs), Avg:98.9 F (37.2 C), Min:98.5 F (36.9 C), Max:99.3 F (37.4 C)   Recent Labs Lab 04/17/17 0132 04/19/17 1130 04/19/17 1303 04/21/17 0312 04/22/17 0309  WBC 5.6 17.0*  --  12.6* 12.8*  CREATININE 1.09 1.15  --  1.10 1.13  VANCOTROUGH  --   --  22*  --   --     Estimated Creatinine Clearance: 43.1 mL/min (by C-G formula based on SCr of 1.13 mg/dL).    Allergies  Allergen Reactions  . Penicillins Swelling    Has patient had a PCN reaction causing immediate rash, facial/tongue/throat swelling, SOB or lightheadedness with hypotension: yes Has patient had a PCN reaction causing severe rash involving mucus membranes or skin necrosis: no Has patient had a PCN reaction that required hospitalization- yes already in the hospital Has patient had a PCN reaction occurring within the last 10 years: No If all of the above answers are "NO", then may proceed with Cephalosporin use.    Antimicrobials this  admission:  6/5 cefepime>>6/8>> resume 6/10>> 6/5 vancomycin>> 6/7 CTX>> 6/10 6/5 levofloxacin >> 6/6 6/6 PO vanc >>6/7 6/6 IV flagyl >>6/7>> resume 6/10>> 6/11  Dose adjustments this admission:  6/6 increase vancomycin from 500mg  IV q24h to 750mg  q24h for improved SCr 6/7 increase vancomycin 750 mg q12h for improved SCr 6/8 VT at 1149 (per RN) = 19  (on 750 mg q12h) 6/9 VT= 23 (on 750mg  IV q12h) -- reduced to 500 mg q12h 6/11 VT = (canceled, unable to obtain blood for labs) 6/12 1130 VT = 22.  Drawn ~4 hours early (last dose at 0330 with next dose due at 1600) d/t inability to obtain blood by lab and only PCCM able to get labs.  Extrapolated true trough level ~ 18.5.  Continue same dose.   Microbiology results:  6/5 BCx at 1850: 1/2 Providencia stuartii (S= cefe, ceftaz, CTX, imi, zosyn) (nothing on BCID) 6/5 BCx at 2220: 1/2 Enterococcus faecalis (S= amp, vanc) (BCID=enterococcus no resistance) FINAL 6/5 C.Diff Ag/PCR: POSITIVE, Toxin: NEGATIVE (no stools per RN but tx since WBC elevated per MD, = Colonization)  6/6 UCx: multiple species, need recollection FINAL 6/6 MRSA PCR: neg 6/7 bcx x2: ngtd 6/9 ucx: NGF 6/14 urine: sent 6/14: Trach aspirate: Stenotrophomonas maltophilia 6/14: BC: sent   Thank you for allowing pharmacy to be a part of this patient's care.   Royetta Asal, PharmD, BCPS Pager [phone] 04/23/2017 2:52 PM

## 2017-04-24 ENCOUNTER — Inpatient Hospital Stay (HOSPITAL_COMMUNITY): Payer: Medicare Other

## 2017-04-24 DIAGNOSIS — E440 Moderate protein-calorie malnutrition: Secondary | ICD-10-CM

## 2017-04-24 LAB — GLUCOSE, CAPILLARY
GLUCOSE-CAPILLARY: 115 mg/dL — AB (ref 65–99)
GLUCOSE-CAPILLARY: 125 mg/dL — AB (ref 65–99)
GLUCOSE-CAPILLARY: 128 mg/dL — AB (ref 65–99)
GLUCOSE-CAPILLARY: 130 mg/dL — AB (ref 65–99)
GLUCOSE-CAPILLARY: 157 mg/dL — AB (ref 65–99)
Glucose-Capillary: 141 mg/dL — ABNORMAL HIGH (ref 65–99)
Glucose-Capillary: 150 mg/dL — ABNORMAL HIGH (ref 65–99)

## 2017-04-24 LAB — CULTURE, RESPIRATORY

## 2017-04-24 LAB — CULTURE, RESPIRATORY W GRAM STAIN

## 2017-04-24 MED ORDER — FUROSEMIDE 10 MG/ML IJ SOLN
40.0000 mg | Freq: Four times a day (QID) | INTRAMUSCULAR | Status: AC
  Administered 2017-04-24 (×2): 40 mg via INTRAVENOUS
  Filled 2017-04-24 (×2): qty 4

## 2017-04-24 MED ORDER — POTASSIUM CHLORIDE 20 MEQ/15ML (10%) PO SOLN: 40.0000 meq | Freq: Two times a day (BID) | ORAL | Status: DC

## 2017-04-24 NOTE — Progress Notes (Signed)
During patient bath at 2200, ETT noticed to be at 22, 4cm farther out than on initial assessment at 2000. Tube feeds paused.  RT and Elink MD notified. ETT pushed down by RT.  Chest and abdominal xray ordered and confirmed correct placement.  ETT now at 26 at the upper lips.  OG measured at 63cm from lips to stopcock. Tube feeds resumed.  Patient vitals stable throughout.

## 2017-04-24 NOTE — Progress Notes (Signed)
PULMONARY / CRITICAL CARE MEDICINE   Name: CASIMIRO LIENHARD MRN: [id number] DOB: 1928-12-18    ADMISSION DATE:  04/12/2017 CONSULTATION DATE:  6/6  REFERRING MD:  [name]   CHIEF COMPLAINT:  Shock  BRIEF SUMMARY:    83 year old man with dementia & recent admission 5/17-5/21 for urinary retention due to BPH and cystitis, discharged with indwelling Foley to SNF for rehabilitation. He was readmitted 6/5 with altered mental status, hypotension and lactate of 6 with WBC count of 19.6. He required fluids and Neo-Synephrine drip.  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).  Transferred back to ICU 6/9, intubated for resp distress. Code status reversed by family.  SUBJECTIVE:   Can't get labs again Diuresing  Some hematuria noted overnight  VITAL SIGNS: BP (!) 148/38   Pulse 85   Temp 98.2 F (36.8 C) (Oral)   Resp 17   Ht 5\' 6"  (1.676 m)   Wt 156 lb 1.4 oz (70.8 kg)   SpO2 100%   BMI 25.19 kg/m   HEMODYNAMICS:   VENTILATOR SETTINGS: Vent Mode: PSV;CPAP FiO2 (%):  [30 %] 30 % Set Rate:  [16 bmp] 16 bmp Vt Set:  [510 mL] 510 mL PEEP:  [5 cmH20] 5 cmH20 Pressure Support:  [5 cmH20] 5 cmH20 Plateau Pressure:  [19 cmH20-29 cmH20] 24 cmH20  INTAKE / OUTPUT: I/O last 3 completed shifts: In: 6387 [I.V.:1700; Other:100; NG/GT:1350; IV Piggyback:870] Out: 5643 [Urine:5445]  PHYSICAL EXAMINATION: General:  Resting in bed HENT: NCAT ETT in place PULM: some rhonchi, vent supported breathing CV: RRR, no mgr GI: BS+, soft, nontender MSK: no bony abnormalities Neuro: he won't follow commands but he will grimace or raise his arms when touched  LABS:  BMET  Recent Labs Lab 04/19/17 1130 04/21/17 0312 04/22/17 0309  NA 134* 134* 134*  K 3.3* 3.9 3.5  CL 106 107 107  CO2 22 21* 22  BUN 17 20 19   CREATININE 1.15 1.10 1.13  GLUCOSE 138* 87 102*    Electrolytes  Recent Labs Lab 04/19/17 1130  04/21/17 0312 04/22/17 0309 04/22/17 1320 04/22/17 1710  CALCIUM 7.9* 7.5* 7.6*  --   --   MG  --  1.8  --  1.6* 1.8  PHOS  --  3.2  --  3.1 2.7    CBC  Recent Labs Lab 04/19/17 1130 04/21/17 0312 04/22/17 0309  WBC 17.0* 12.6* 12.8*  HGB 8.4* 8.4* 7.6*  HCT 24.3* 24.8* 22.0*  PLT 96* 130* 164    Coag's No results for input(s): APTT, INR in the last 168 hours.  Sepsis Markers No results for input(s): LATICACIDVEN, PROCALCITON, O2SATVEN in the last 168 hours.  ABG No results for input(s): PHART, PCO2ART, PO2ART in the last 168 hours.  Liver Enzymes No results for input(s): AST, ALT, ALKPHOS, BILITOT, ALBUMIN in the last 168 hours.  Cardiac Enzymes No results for input(s): TROPONINI, PROBNP in the last 168 hours.  Glucose  Recent Labs Lab 04/23/17 1231 04/23/17 1735 04/23/17 1943 04/24/17 0019 04/24/17 0351 04/24/17 0746  GLUCAP 133* 177* 151* 157* 141* 128*    Imaging No results found. STUDIES:  CXR 6/9 >> worsening bilateral opacities likely pleural effusion with pneumonia. 6/14 CxR>> worse airspace disease  CULTURES: BCx2 6/5 >> enterococcus faecalis (S-vanco), providencia stuartii (S-cefepime) C-Diff 6/5 >> antigen positive, toxin neg BCx2 6/7 >> neg UC 6/9 >> negative   ANTIBIOTICS: Cefepime  6/5 >> 6/7 Ceftx 6/7 >>  6/10 Levaquin 6/5 >> 6/5 Vancomycin 6/5 >> Cefepime 6/6 >>  Metronidazole 6/6 >> 6/11   SIGNIFICANT EVENTS: 5/17 > 5/21 admit for urinary retention 6/05  Admit for sepsis 6/09  Rapid response. Intubated 6/10  Maintain on the vent overnight.  Soft BP but not on pressors 6/12  Weaning on PSV 5/5 6/13 palliative care consult 6/13 TF stopped per family request  LINES/TUBES: ETT 6/9 >>  DISCUSSION: 83 year old male with dementia and recent admit for urinary retention presenting with septic shock-enterococcal + providencia bacteremia, likely source being UTI.  C-diff antigen positive, toxin negative > likely  colonization-prior CT scan had shown proctocolitis.  Back to ICU 6/9, intubated after respiratory distress due to presumed aspiration.  ASSESSMENT / PLAN:  RESPIRATORY  A: Acute respiratory failure with hypoxemia due to aspiration Likely acute pulmonary edema and transudative effusions P: Continue full vent support with daily weaning trials VAP prevention Family planning one way extubation in next 24-48 hours per conversation 6/16 Lasix today again   CARDIOVASCULAR A:  Septic Shock - resolved Limited IV Access P:  tele   RENAL A:   AKI BPH P:   Continue foley Monitor BMET and UOP > continue attempts to get blood Replace electrolytes as needed Replace KCl with lasix   GASTROINTESTINAL A:   Protein calorie malnutrition P:   Pepcid for SUP  Continue tube feeding  HEMATOLOGIC A:   Anemia P:  Monitor for bleeding  INFECTIOUS A:   Recent UTI C-Diff - favor colonization  HCAP P:   Abx per ID Monitor cultures   ENDOCRINE A:   Hypoglycemia Hypothyroidism  P:   Continue tube feeding D5NS with hypoglycemia  Continue Armour  NEUROLOGIC A:   Acute encephalopathy Baseline dementia P:   RASS goal: 0 to -1 Minimize sedating meds PAD protocol: intermittent only    FAMILY  - Updates: daughter updated by phone on 6/16, she tells me she is ready for one way extubation by Monday but waiting for her mother to agree to it. Nursing reports that the daughter seemed to have more of a since of optimism about the patient's mental status however when she came later in the day on 6/16.  Will continue to engage family face to face as they are available.   -  Palliative care spoke with daughter 6/13. Note palliative care has been involved multiple times.   My cc time 30 minutes  Roselie Awkward, MD [facility] Pager: [phone] Cell: [phone] After 3pm or if no response, call [phone]

## 2017-04-25 LAB — GLUCOSE, CAPILLARY
GLUCOSE-CAPILLARY: 130 mg/dL — AB (ref 65–99)
GLUCOSE-CAPILLARY: 93 mg/dL (ref 65–99)
Glucose-Capillary: 108 mg/dL — ABNORMAL HIGH (ref 65–99)
Glucose-Capillary: 113 mg/dL — ABNORMAL HIGH (ref 65–99)
Glucose-Capillary: 131 mg/dL — ABNORMAL HIGH (ref 65–99)

## 2017-04-25 LAB — BASIC METABOLIC PANEL
ANION GAP: 4 — AB (ref 5–15)
BUN: 18 mg/dL (ref 6–20)
CALCIUM: 7.7 mg/dL — AB (ref 8.9–10.3)
CHLORIDE: 106 mmol/L (ref 101–111)
CO2: 25 mmol/L (ref 22–32)
Creatinine, Ser: 1.88 mg/dL — ABNORMAL HIGH (ref 0.61–1.24)
GFR calc non Af Amer: 31 mL/min — ABNORMAL LOW (ref >60)
GFR, EST AFRICAN AMERICAN: 36 mL/min — AB (ref >60)
Glucose, Bld: 128 mg/dL — ABNORMAL HIGH (ref 65–99)
POTASSIUM: 4.6 mmol/L (ref 3.5–5.1)
Sodium: 135 mmol/L (ref 135–145)

## 2017-04-25 MED ORDER — SULFAMETHOXAZOLE-TRIMETHOPRIM 400-80 MG/5ML IV SOLN
320.0000 mg | Freq: Two times a day (BID) | INTRAVENOUS | Status: DC
  Administered 2017-04-25 – 2017-04-29 (×8): 320 mg via INTRAVENOUS
  Filled 2017-04-25 (×10): qty 20

## 2017-04-25 MED ORDER — RANITIDINE HCL 150 MG/10ML PO SYRP
150.0000 mg | ORAL_SOLUTION | Freq: Every day | ORAL | Status: DC
  Administered 2017-04-25 – 2017-05-05 (×11): 150 mg
  Filled 2017-04-25 (×12): qty 10

## 2017-04-25 MED ORDER — FAMOTIDINE 40 MG/5ML PO SUSR: 20.0000 mg | Freq: Two times a day (BID) | ORAL | Status: DC

## 2017-04-25 MED ORDER — FAMOTIDINE IN NACL 20-0.9 MG/50ML-% IV SOLN
20.0000 mg | INTRAVENOUS | Status: DC
  Administered 2017-04-25: 20 mg via INTRAVENOUS
  Filled 2017-04-25: qty 50

## 2017-04-25 MED ORDER — DEXTROSE 10 % IV SOLN
INTRAVENOUS | Status: DC
  Administered 2017-04-25 – 2017-04-27 (×3): via INTRAVENOUS

## 2017-04-25 NOTE — Progress Notes (Signed)
Pharmacy Antibiotic Note  Erik Massey is a 83 y.o. male admitted on 04/12/2017 from nursing home with sepsis.  Patient went into respiratory distress (6/10) and is now re-intubated. Pharmacy is currently consulted to dose bactrim and Cefepime for Bacteremia and PNA.    Today, 04/25/2017: - Day #8 cefepime and Day #3 bactrim - Tmax 100.8 -  scr up 1.88 (crcl~26)   Plan:  Continue Cefepime 2g IV q24h  Reduce Bactrim to 320 mg IV q12h for renal function (~5 mg/kg/dose --dosing based on trimethoprim component)   Follow up renal fxn, culture results, and clinical course.  _____________________________________  Height: 5\' 6"  (167.6 cm) Weight: 154 lb 1.6 oz (69.9 kg) IBW/kg (Calculated) : 63.8  Temp (24hrs), Avg:99.6 F (37.6 C), Min:98 F (36.7 C), Max:100.8 F (38.2 C)   Recent Labs Lab 04/19/17 1130 04/19/17 1303 04/21/17 0312 04/22/17 0309 04/25/17 0318  WBC 17.0*  --  12.6* 12.8*  --   CREATININE 1.15  --  1.10 1.13 1.88*  VANCOTROUGH  --  22*  --   --   --     Estimated Creatinine Clearance: 25.9 mL/min (A) (by C-G formula based on SCr of 1.88 mg/dL (H)).    Allergies  Allergen Reactions  . Penicillins Swelling    Has patient had a PCN reaction causing immediate rash, facial/tongue/throat swelling, SOB or lightheadedness with hypotension: yes Has patient had a PCN reaction causing severe rash involving mucus membranes or skin necrosis: no Has patient had a PCN reaction that required hospitalization- yes already in the hospital Has patient had a PCN reaction occurring within the last 10 years: No If all of the above answers are "NO", then may proceed with Cephalosporin use.    Antimicrobials this admission:  6/5 cefepime >> 6/7>> resume 6/10>> 6/5 vancomycin >> 6/16  6/7 CTX>>6/10 6/5 levofloxacin >> 6/6 6/6 PO vanc >>6/7 6/6 IV flagyl >>6/7>> resume 6/10>> 6/11 6/16 bactrim (stenotrop PNA)>>    Dose adjustments this admission:  6/6 increase  vancomycin from 500mg  IV q24h to 750mg  q24h for improved SCr 6/7 increase vancomycin 750 mg q12h for improved SCr 6/8 VT at 1149 (per RN) = 19  (on 750 mg q12h) 6/9 VT= 23 (on 750mg  IV q12h) -- reduced to 500 mg q12h 6/11 VT = (canceled, unable to obtain blood for labs) 6/12 1130 VT = 22.  Drawn ~4 hours early (last dose at 0330 with next dose due at 1600) d/t inability to obtain blood by lab and only PCCM able to get labs.  Extrapolated trough level ~ 18.5  Microbiology results:  6/5 BCx at 1850: 1/2 Providencia stuartii (S= cefe, ceftaz, CTX, imi, zosyn) (nothing on BCID) 6/5 BCx at 2220: 1/2 Enterococcus faecalis (S= amp, vanc) (BCID=enterococcus no resistance) FINAL 6/5 C.Diff Ag/PCR: POSITIVE, Toxin: NEGATIVE (no stools per RN but tx since WBC elevated per MD, = Colonization)  6/6 UCx: multiple species, need recollection FINAL 6/6 MRSA PCR: neg 6/7 bcx x2: neg FINAL 6/9 ucx: NGF 6/14 urine: NGF 6/14: Trach aspirate: Stenotrophomonas Maltophilia ( I to levoflox, S to Septra)  6/14: BCx x2: NGTD  Thank you for allowing pharmacy to be a part of this patient's care.   Dia Sitter, PharmD, BCPS 04/25/2017 8:47 AM

## 2017-04-25 NOTE — Progress Notes (Signed)
Date:  April 25, 2017 Chart reviewed for concurrent status and case management needs. Will continue to follow patient progress. Remains on full vent support. Discharge Planning: following for needs possible transfer to [facility]. Expected discharge date: [id number] Velva Harman, BSN, [place], [place]

## 2017-04-25 NOTE — Progress Notes (Signed)
LCSW consulted as patient admitted from facility: [facility]  After reviewing chart, LCSW will hold assessment until after tomorrow due to determination of next steps as reported in palliative care note:     daughter plans on coming in tomorrow 04-26-17 at 32 for a family meeting to discuss possibility of one way extubation and further discussions about need for full comfort measures if necessary after extubation. She has also discussed this with Dr Elsworth Soho from [facility]. PM team, likely, my colleague Dr Domingo Cocking will meet with her on 04-26-17 at 19 and we will plan on calling her mom to include her in our conversations.   LCSW will be available to assist with any needs related to discharge planning and family wishes. Patient continues to remain on vent for full support per notes.  Available as needs arise and following.  Lane Hacker, MSW Clinical Social Work: [employer] Cover[employer] for :  [phone]

## 2017-04-25 NOTE — Progress Notes (Signed)
PULMONARY / CRITICAL CARE MEDICINE   Name: Erik Massey MRN: [id number] DOB: 07-29-1929    ADMISSION DATE:  04/12/2017 CONSULTATION DATE:  6/6  REFERRING MD:  [name]   CHIEF COMPLAINT:  Shock  BRIEF SUMMARY:    83 year old man with dementia & recent admission 5/17-5/21 for urinary retention due to BPH and cystitis, discharged with indwelling Foley to SNF for rehabilitation. He was readmitted 6/5 with altered mental status, hypotension and lactate of 6 with WBC count of 19.6. He required fluids and Neo-Synephrine drip.  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).  Transferred back to ICU 6/9, intubated for resp distress. Code status reversed by family.   STUDIES:  CXR 6/9 >> worsening bilateral opacities likely pleural effusion with pneumonia. 6/14 CxR>> worse airspace disease  CULTURES: BCx2 6/5 >> enterococcus faecalis (S-vanco), providencia stuartii (S-cefepime) C-Diff 6/5 >> antigen positive, toxin neg BCx2 6/7 >> neg UC 6/9 >> negative  resp 6/14 >> stenotrophomonas   ANTIBIOTICS: Cefepime  6/5 >> 6/7 Ceftx 6/7 >> 6/10 Levaquin 6/5 >> 6/5 Vancomycin 6/5 >>6/16 Cefepime 6/6 >> 6/18 Metronidazole 6/6 >> 6/11  Bactrim 6/16 >>  SIGNIFICANT EVENTS: 5/17 > 5/21 admit for urinary retention 6/05  Admit for sepsis 6/09  Rapid response. Intubated 6/10  Maintain on the vent overnight.  Soft BP but not on pressors 6/12  Weaning on PSV 5/5 6/13 palliative care consult 6/13 TF stopped per family request  LINES/TUBES: ETT 6/9 >>  SUBJECTIVE:   Remains critically ill, intubated  Does not appear in pain, but minimally responsive afebrile  VITAL SIGNS: BP (!) 112/40   Pulse 89   Temp 99.5 F (37.5 C) (Axillary)   Resp (!) 25   Ht 5\' 6"  (1.676 m)   Wt 154 lb 1.6 oz (69.9 kg)   SpO2 100%   BMI 24.87 kg/m   HEMODYNAMICS:   VENTILATOR SETTINGS: Vent Mode: PSV FiO2 (%):  [30 %] 30 % Set  Rate:  [16 bmp] 16 bmp Vt Set:  [510 mL] 510 mL PEEP:  [5 cmH20] 5 cmH20 Pressure Support:  [5 cmH20] 5 cmH20 Plateau Pressure:  [15 cmH20-21 cmH20] 21 cmH20  INTAKE / OUTPUT: I/O last 3 completed shifts: In: 3532 [I.V.:1800; Other:90; NG/GT:2165; IV Piggyback:2280] Out: 9924 [Urine:6975]  PHYSICAL EXAMINATION: General: elderly, no distress, oral ETT HENT: NCAT , no pallor, citerus PULM: some rhonchi, vent supported breathing CV: RRR, no mgr GI: BS+, soft, nontender MSK: BUE contractures Neuro: does not follow commands,  grimace with pain  LABS:  BMET  Recent Labs Lab 04/21/17 0312 04/22/17 0309 04/25/17 0318  NA 134* 134* 135  K 3.9 3.5 4.6  CL 107 107 106  CO2 21* 22 25  BUN 20 19 18   CREATININE 1.10 1.13 1.88*  GLUCOSE 87 102* 128*    Electrolytes  Recent Labs Lab 04/21/17 0312 04/22/17 0309 04/22/17 1320 04/22/17 1710 04/25/17 0318  CALCIUM 7.5* 7.6*  --   --  7.7*  MG 1.8  --  1.6* 1.8  --   PHOS 3.2  --  3.1 2.7  --     CBC  Recent Labs Lab 04/19/17 1130 04/21/17 0312 04/22/17 0309  WBC 17.0* 12.6* 12.8*  HGB 8.4* 8.4* 7.6*  HCT 24.3* 24.8* 22.0*  PLT 96* 130* 164    Coag's No results for input(s): APTT, INR in the last 168 hours.  Sepsis Markers No results for input(s): LATICACIDVEN, PROCALCITON, O2SATVEN in  the last 168 hours.  ABG No results for input(s): PHART, PCO2ART, PO2ART in the last 168 hours.  Liver Enzymes No results for input(s): AST, ALT, ALKPHOS, BILITOT, ALBUMIN in the last 168 hours.  Cardiac Enzymes No results for input(s): TROPONINI, PROBNP in the last 168 hours.  Glucose  Recent Labs Lab 04/24/17 1147 04/24/17 1556 04/24/17 1936 04/24/17 2322 04/25/17 0335 04/25/17 0748  GLUCAP 125* 115* 150* 130* 108* 130*    Imaging Dg Abd 1 View  Result Date: 04/24/2017 CLINICAL DATA:  NG tube placement.  Acute respiratory failure. EXAM: ABDOMEN - 1 VIEW COMPARISON:  Abdominal radiograph 04/20/2017 FINDINGS:  Tip and side-port below the diaphragm in the stomach. Nonobstructive bowel gas pattern in the upper abdomen. Endotracheal tube better assessed on concurrent chest radiograph. IMPRESSION: Tip and side port of the enteric tube below the diaphragm in the stomach. Electronically Signed   By: Jeb Levering M.D.   On: 04/24/2017 22:38   Dg Chest Port 1 View  Result Date: 04/24/2017 CLINICAL DATA:  Acute respiratory failure. Evaluate reposition of endotracheal tube. EXAM: PORTABLE CHEST 1 VIEW COMPARISON:  Chest radiograph yesterday at 10 a.m. FINDINGS: Endotracheal tube is approximately 2.3 cm from the carina. Endotracheal tube again appears directed towards the right mainstem bronchus, similar to prior exam however is more than 2 cm above the carina. Enteric tube in place, tip and side-port below the diaphragm. Unchanged left lung base opacity likely combination of pleural fluid and airspace disease. Unchanged patchy opacities in the right lower lung zone, with possible progressive airspace disease in the right mid lung. Unchanged heart size and mediastinal contours. No pneumothorax. Chronic change at the right shoulder. IMPRESSION: 1. Endotracheal tube remains directed towards the right mainstem bronchus, however is at least 2 cm above the carina, similar in appearance to prior exam. 2. Patchy opacities in the right mid lung zone with possible progression. Right basilar airspace disease is grossly stable. 3. Unchanged left lung base opacity likely combination of pleural fluid and atelectasis/ pneumonia. Electronically Signed   By: Jeb Levering M.D.   On: 04/24/2017 22:41     DISCUSSION: 83 year old male with dementia and recent admit for urinary retention presenting with septic shock-enterococcal + providencia bacteremia, likely source being UTI.  C-diff antigen positive, toxin negative > likely colonization-prior CT scan had shown proctocolitis.  Back to ICU 6/9, intubated after respiratory distress due  to presumed aspiration/ HCAP  ASSESSMENT / PLAN:  RESPIRATORY  A: Acute respiratory failure with hypoxemia due to aspiration Likely acute pulmonary edema and transudative effusions P: SBTs  VAP prevention Eventual one way extubation once optimised CARDIOVASCULAR A:  Septic Shock - resolved Limited IV Access P:  tele   RENAL A:   AKI BPH P:   Continue foley Monitor BMET and UOP > continue attempts to get blood Replace electrolytes as needed Cr increased with lasix? ? bactrim - hold  Dc iVFs since bactrim gives a lot of volume   GASTROINTESTINAL A:   Protein calorie malnutrition P:   Pepcid for SUP  Continue tube feeding  HEMATOLOGIC A:   Anemia P:  Monitor for bleeding  INFECTIOUS A:   Recent UTI C-Diff - favor colonization  HCAP -stenotrophomonas P:   Bactrim per ID Monitor cultures   ENDOCRINE A:   Hypoglycemia Hypothyroidism  P:   Continue tube feeding Continue Armour D!0 if CBGs drop  NEUROLOGIC A:   Acute encephalopathy Baseline dementia P:   RASS goal: 0 to -1 Minimize  sedating meds PAD protocol: intermittent only    FAMILY  - Updates: daughter updated 6/18 - she is agreeable to one way extubation after their meeting with palliative care on 6/19  -  Palliative care  has been involved multiple times.   My cc time 32 minutes  Kara Mead MD. FCCP. [place] [facility] Pager [phone] If no response call [phone]   04/25/2017

## 2017-04-25 NOTE — Progress Notes (Signed)
Nutrition Follow-up  DOCUMENTATION CODES:   Not applicable  INTERVENTION:  - If pt requires TF/if TF within [place] today, continue Osmolite 1.2 @ 60 mL/hr which will provide 1728 kcal (96% re-estimated kcal need), 80 grams of protein (95% re-estimated minimum protein need), and 1181 mL free water.  - Will monitor medical course and GOC.   NUTRITION DIAGNOSIS:   Inadequate oral intake related to inability to eat as evidenced by NPO status. -ongoing  GOAL:   Patient will meet greater than or equal to 90% of their needs -previously meet, unmet at this time.   MONITOR:   Vent status, TF tolerance, Weight trends, Labs, I & O's  ASSESSMENT:   83 y.o. male with history of dementia, hypothyroidism, seizure disorder who was recently admitted for abdominal pain and had CT scan showing left upper quadrant mass. As per the patient's daughter, patient was doing fine until 2 days ago when patient became increasingly lethargic weak and more bed bound. Patient was brought to the occupational getting hypotensive and lethargic and increasing hematuria. Pt intubated early AM 6/10 d/t respiratory failure following aspiration.  6/18 Pt remains intubated with OGT in place. TF currently turned off and RN reports she turned it off a short time ago and that Dr. Elsworth Soho is going to talk with daughter this AM about plan concerning extubation; Dr. Anastasia Pall note yesterday stated possible one-way extubation within the next 24-48 hours. Estimated needs updated this AM and are now based on current weight as weight has been stable since 6/11; previously based on admission weight of 59.6 kg. RN reports that pt may need NGT placement following extubation dependent on course following extubation; will keep current TF regimen in place.   Patient is currently intubated on ventilator support MV: 11.5 L/min Temp (24hrs), Avg:99.6 F (37.6 C), Min:98 F (36.7 C), Max:100.8 F (38.2 C) BP: 123/49 and MAP: 70  Medications  reviewed; 20 mg IV Pepcid/day, 40 mg IV Lasix x2 doses yesterday, 400 mg Mag-ox per OGT/day, 15 mL liquid multivitamin per OGT/day, 40 mEq KCl per OGT x2 doses 6/16 and 6/17. Labs reviewed; CBGs: 108 and 130 mg/dL this AM, creatinine: 1.88 mg/dL, Ca: 7.7 mg/dL, GFR: 36 mL/min.   IVF: D5-NS @ 50 mL/hr (204 kcal from dextrose)   6/15 - Consult received as family would like TF to be restarted today.  - Pt has OGT in place.  - Spoke with RN who confirms plan to restart TF today.  - Pt had a BM today.  - RD will continue to monitor POC/GOC. Pt is currently a partial code.   Patient is currently intubated on ventilator support MV: 8.7 L/min Temp (24hrs), Avg:99.8 F (37.7 C), Min:98.9 F (37.2 C), Max:101.5 F (38.6 C) BP: 160/54 and MAP: 88 IVF:D5-NS @ 50 mL/hr (204 kcal from dextrose).   6/14 - Reviewed RN note from yesterday at 11:20 AM which states daughter's request for TF to be turned off; TF remains off since that time.  - Reviewed Palliative Care's notes from yesterday and this AM and will continue to monitor for [place].  - Reviewed PCCM NP note from this AM stating OGT is looped in pt's stomach and that should TF be restarted, plan for Cortrak placement. Cortrak service only available at [place]; recommend small bore NGT be placed by IR should tube be replaced.    Diet Order:   NPO  Skin:  Reviewed, no issues  Last BM:  6/16  Height:   Ht Readings from  Last 1 Encounters:  04/18/17 5\' 6"  (1.676 m)    Weight:   Wt Readings from Last 1 Encounters:  04/25/17 154 lb 1.6 oz (69.9 kg)    Ideal Body Weight:  53.64 kg  BMI:  Body mass index is 24.87 kg/m.  Estimated Nutritional Needs:   Kcal:  1886  Protein:  84-104 grams (1.2-1.5 grams/kg)  Fluid:  >/= 1.4 L/day  EDUCATION NEEDS:   No education needs identified at this time    Jarome Matin, MS, RD, LDN, CNSC Inpatient Clinical Dietitian Pager # [phone] After hours/weekend pager # [phone]

## 2017-04-25 NOTE — Progress Notes (Signed)
eLink Physician-Brief Progress Note Patient Name: EVERITT WENNER DOB: [birth year] MRN: [id number]   Date of Service  04/25/2017  HPI/Events of Note  RN notes some tube feeds in mouth  eICU Interventions  Hold tube feedings Start D10 as he was hypoglycemic off tube feedings prior     Intervention Category Minor Interventions: Routine modifications to care plan (e.g. PRN medications for pain, fever)  Simonne Maffucci 04/25/2017, 7:40 PM

## 2017-04-25 NOTE — Progress Notes (Signed)
Daily Progress Note   Patient Name: Erik Massey       Date: 04/25/2017 DOB: 03-24-1929  Age: 83 y.o. MRN#: [id number] Attending Physician: [name], MD Primary Care Physician: Glendale Chard, MD Admit Date: 04/12/2017  Reason for Consultation/Follow-up: Establishing goals of care  Subjective:  remains on the vent, one eye closed, has contractures Does not open eyes, does not respond when I call out to him See below:  Length of Stay: 13  Current Medications: Scheduled Meds:  . betaxolol  1 drop Right Eye Daily  . carBAMazepine  200 mg Per Tube QID  . chlorhexidine gluconate (MEDLINE KIT)  15 mL Mouth Rinse BID  . dorzolamide  1 drop Right Eye Q12H  . finasteride  5 mg Oral Daily  . latanoprost  1 drop Right Eye QHS  . magnesium oxide  400 mg Per Tube Daily  . mouth rinse  15 mL Mouth Rinse QID  . multivitamin  15 mL Per Tube Daily  . tamsulosin  0.4 mg Oral Daily  . thyroid  90 mg Per Tube Daily    Continuous Infusions: . famotidine (PEPCID) IV Stopped (04/25/17 1043)  . feeding supplement (OSMOLITE 1.2 CAL) Stopped (04/25/17 0915)  . sulfamethoxazole-trimethoprim      PRN Meds: acetaminophen **OR** acetaminophen, fentaNYL (SUBLIMAZE) injection, fentaNYL (SUBLIMAZE) injection, ondansetron **OR** ondansetron (ZOFRAN) IV  Physical Exam         Elderly gentleman On the vent Regular Clear shallow anterior breath sounds Has edema Has contractures Not very responsive at all  Vital Signs: BP (!) 112/40   Pulse 89   Temp 99.5 F (37.5 C) (Axillary)   Resp (!) 25   Ht 5' 6"  (1.676 m)   Wt 69.9 kg (154 lb 1.6 oz)   SpO2 100%   BMI 24.87 kg/m  SpO2: SpO2: 100 % O2 Device: O2 Device: Ventilator O2 Flow Rate: O2 Flow Rate (L/min): 15 L/min  Intake/output  summary:  Intake/Output Summary (Last 24 hours) at 04/25/17 1115 Last data filed at 04/25/17 1013  Gross per 24 hour  Intake             4645 ml  Output             4300 ml  Net  345 ml   LBM: Last BM Date: 04/23/17 Baseline Weight: Weight: 59.6 kg (131 lb 6.3 oz) Most recent weight: Weight: 69.9 kg (154 lb 1.6 oz)       Palliative Assessment/Data:    Flowsheet Rows     Most Recent Value  Intake Tab  Referral Department  Critical care  Unit at Time of Referral  ICU  Palliative Care Primary Diagnosis  Other (Comment) [dementia, recurrent infections, VDRF ]  Date Notified  04/20/17  Palliative Care Type  Return patient Palliative Care  Reason for referral  Clarify Goals of Care  Date of Admission  04/12/17  Date first seen by Palliative Care  04/20/17  # of days IP prior to Palliative referral  8  Clinical Assessment  Palliative Performance Scale Score  10%  Pain Max last 24 hours  6  Pain Min Last 24 hours  4  Dyspnea Max Last 24 Hours  4  Dyspnea Min Last 24 hours  3  Anxiety Max Last 24 Hours  6  Anxiety Min Last 24 Hours  4  Psychosocial & Spiritual Assessment  Palliative Care Outcomes  Patient/Family meeting held?  Yes  Who was at the meeting?  discussed with patient's daughter over the phone.   Palliative Care Outcomes  Clarified goals of care      Patient Active Problem List   Diagnosis Date Noted  . HCAP (healthcare-associated pneumonia) 04/18/2017  . Respiratory failure ([place])   . Sebaceous cyst   . Dementia 04/14/2017  . Thrombocytopenia ([place]) 04/14/2017  . Enterococcal bacteremia 04/14/2017  . Gram-negative bacteremia 04/14/2017  . Moderate protein-calorie malnutrition ([place]) 04/14/2017  . Sepsis ([place]) 04/12/2017  . Normochromic normocytic anemia 04/12/2017  . Diarrhea 04/12/2017  . ARF (acute renal failure) ([place]) 03/24/2017  . Seizure ([place]) 10/20/2015  . UTI (urinary tract infection) 10/20/2015  . Physical deconditioning   . 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    Recommendations/Plan:   I did receive a phone call from Dr Heath Gold this morning, she is appreciative of the information she has received from the ICU staff at [facility], she is able to tell me that she realizes that her Dad has not had much recovery/stabilization, she does wish to discuss next steps with [place] and wants to know how palliative care can help. She plans on coming in tomorrow 04-26-17 at 1300 for a family meeting to discuss possibility of one way extubation and further discussions about need for full comfort measures if necessary after extubation. She has also discussed this with Dr Elsworth Soho from [facility]. PM team, likely, my colleague Dr Domingo Cocking will meet with her on 04-26-17 at 71 and we will plan on calling her mom to include her in our conversations.    Code Status:    Code Status Orders        Start     Ordered   04/17/17 1350  Limited resuscitation (code)  Continuous    Question Answer Comment  In the event of cardiac or respiratory ARREST: Initiate Code Blue, Call Rapid Response No   In the event of cardiac or respiratory ARREST: Perform CPR No   In the event of cardiac or respiratory ARREST: Perform Intubation/Mechanical Ventilation Yes  In the event of cardiac or respiratory ARREST: Use NIPPV/BiPAp only if indicated Yes   In the event of cardiac or respiratory ARREST: Administer ACLS medications if indicated No   In the event of cardiac or respiratory ARREST: Perform Defibrillation or Cardioversion if indicated No      04/17/17 1349    Code Status History    Date Active Date Inactive Code Status Order ID Comments User Context   04/17/2017 12:17 AM  04/17/2017  1:49 PM Partial Code [id number]  Luz Brazen, MD Inpatient   04/16/2017 11:37 PM 04/17/2017 12:17 AM Full Code [id number]  Vianne Bulls, MD Inpatient   04/13/2017  1:22 AM 04/16/2017 11:37 PM DNR [id number]  Rise Patience, MD Inpatient   04/12/2017 11:12 PM 04/13/2017  1:19 AM DNR [id number]  Rise Patience, MD ED   04/12/2017  5:23 PM 04/12/2017 11:12 PM DNR [id number]  Fredia Sorrow, MD ED   03/24/2017 11:01 PM 03/28/2017  9:06 PM DNR [id number]  Rise Patience, MD ED   10/21/2015  6:42 PM 10/24/2015  5:41 PM DNR [id number]  Micheline Rough, MD Inpatient   10/20/2015  9:00 AM 10/21/2015  6:42 PM Full Code [id number]  Willia Craze, NP ED   10/20/2015  8:27 AM 10/20/2015  8:27 AM Full Code [id number]  Willia Craze, NP ED    Advance Directive Documentation     Most Recent Value  Type of Advance Directive  Out of facility DNR (pink MOST or yellow form)  Pre-existing out of facility DNR order (yellow form or pink MOST form)  Yellow form placed in chart (order not valid for inpatient use)  "MOST" Form in Place?  -       Prognosis:   guarded  Discharge Planning: Anticipate hospital death versus need for residential hospice once one way extubation and if needed,full scope of comfort measures is established.   Care plan was discussed with patient's daughter Dr Heath Gold, patient's bedside RN.   Thank you for allowing the Palliative Medicine Team to assist in the care of this patient.   Time In: 10.30 Time Out: 11.05 Total Time 35 Prolonged Time Billed  no       Greater than 50%  of this time was spent counseling and coordinating care related to the above assessment and plan.  Loistine Chance, MD [phone]  Please contact Palliative Medicine Team phone at [phone] for questions and concerns.

## 2017-04-25 NOTE — Progress Notes (Signed)
Patient ID: Erik Massey, male   DOB: 10-02-1929, 83 y.o.   MRN: [id number]         [place] for Infectious Disease  Date of Admission:  04/12/2017           Day 13 antibiotics        Day 9 cefepime        Day 2 trimethoprim sulfamethoxazole         Principal Problem:   HCAP (healthcare-associated pneumonia) Active Problems:   UTI (urinary tract infection)   Sepsis ([place])   Enterococcal bacteremia   Gram-negative bacteremia   Seizure (HCC)   ARF (acute renal failure) (HCC)   Normochromic normocytic anemia   Diarrhea   Dementia   Thrombocytopenia (HCC)   Moderate protein-calorie malnutrition ([place])   Sebaceous cyst   Respiratory failure ([place])   . betaxolol  1 drop Right Eye Daily  . carBAMazepine  200 mg Per Tube QID  . chlorhexidine gluconate (MEDLINE KIT)  15 mL Mouth Rinse BID  . dorzolamide  1 drop Right Eye Q12H  . finasteride  5 mg Oral Daily  . latanoprost  1 drop Right Eye QHS  . magnesium oxide  400 mg Per Tube Daily  . mouth rinse  15 mL Mouth Rinse QID  . multivitamin  15 mL Per Tube Daily  . tamsulosin  0.4 mg Oral Daily  . thyroid  90 mg Per Tube Daily   Review of Systems: Review of Systems  Unable to perform ROS: Intubated    Past Medical History:  Diagnosis Date  . Alzheimer disease   . Seizures ([place])     Social History  Substance Use Topics  . Smoking status: Never Smoker  . Smokeless tobacco: Never Used  . Alcohol use No    Family History  Problem Relation Age of Onset  . Family history unknown: Yes   Allergies  Allergen Reactions  . Penicillins Swelling    Has patient had a PCN reaction causing immediate rash, facial/tongue/throat swelling, SOB or lightheadedness with hypotension: yes Has patient had a PCN reaction causing severe rash involving mucus membranes or skin necrosis: no Has patient had a PCN reaction that required hospitalization- yes already in the hospital Has patient had a PCN reaction occurring within the  last 10 years: No If all of the above answers are "NO", then may proceed with Cephalosporin use.     OBJECTIVE: Vitals:   04/25/17 0800 04/25/17 0802 04/25/17 0900 04/25/17 1000  BP: (!) 127/46  (!) 123/49 (!) 112/40  Pulse:      Resp: (!) 26  19 (!) 25  Temp:  99.5 F (37.5 C)    TempSrc:  Axillary    SpO2: 100% 100% 100% 100%  Weight:      Height:       Body mass index is 24.87 kg/m.  Physical Exam  Constitutional:  He remains on the ventilator.   Cardiovascular: Normal rate and regular rhythm.   No murmur heard. Pulmonary/Chest: He has no wheezes.  Abdominal: Soft.  No diarrhea.  Musculoskeletal: He exhibits edema.    Lab Results Lab Results  Component Value Date   WBC 12.8 (H) 04/22/2017   HGB 7.6 (L) 04/22/2017   HCT 22.0 (L) 04/22/2017   MCV 87.0 04/22/2017   PLT 164 04/22/2017    Lab Results  Component Value Date   CREATININE 1.88 (H) 04/25/2017   BUN 18 04/25/2017   NA 135 04/25/2017   K 4.6  04/25/2017   CL 106 04/25/2017   CO2 25 04/25/2017    Lab Results  Component Value Date   ALT 72 (H) 04/13/2017   AST 115 (H) 04/13/2017   ALKPHOS 87 04/13/2017   BILITOT 0.5 04/13/2017     Microbiology: Recent Results (from the past 240 hour(s))  Urine Culture     Status: None   Collection Time: 04/16/17 12:36 PM  Result Value Ref Range Status   Specimen Description URINE, RANDOM  Final   Special Requests NONE  Final   Culture   Final    NO GROWTH Performed at [facility], [place] [address] [place], [place] [address]    Report Status 04/17/2017 FINAL  Final  Culture, blood (Routine X 2) w Reflex to ID Panel     Status: None (Preliminary result)   Collection Time: 04/21/17  1:00 PM  Result Value Ref Range Status   Specimen Description BLOOD LEFT HAND  Final   Special Requests IN PEDIATRIC BOTTLE BCAV  Final   Culture   Final    NO GROWTH 3 DAYS Performed at [facility], [place] [address] [place], [place] [address]    Report Status  PENDING  Incomplete  Culture, blood (Routine X 2) w Reflex to ID Panel     Status: None (Preliminary result)   Collection Time: 04/21/17  1:15 PM  Result Value Ref Range Status   Specimen Description BLOOD LEFT HAND  Final   Special Requests IN PEDIATRIC BOTTLE Blood Culture adequate volume  Final   Culture   Final    NO GROWTH 3 DAYS Performed at [facility], [place] [address], [place], [place] [address]    Report Status PENDING  Incomplete  Culture, Urine     Status: None   Collection Time: 04/21/17  5:12 PM  Result Value Ref Range Status   Specimen Description URINE, CLEAN CATCH  Final   Special Requests Normal  Final   Culture   Final    NO GROWTH Performed at [facility], [place] [address], [place], [place] [address]    Report Status 04/23/2017 FINAL  Final  Culture, respiratory (NON-Expectorated)     Status: None   Collection Time: 04/21/17  5:12 PM  Result Value Ref Range Status   Specimen Description TRACHEAL ASPIRATE  Final   Special Requests NONE  Final   Gram Stain   Final    FEW WBC PRESENT, PREDOMINANTLY PMN ABUNDANT GRAM NEGATIVE RODS FEW YEAST Performed at [facility], [place] [address], [place], [place] [address]    Culture MODERATE STENOTROPHOMONAS MALTOPHILIA  Final   Report Status 04/24/2017 FINAL  Final   Organism ID, Bacteria STENOTROPHOMONAS MALTOPHILIA  Final      Susceptibility   Stenotrophomonas maltophilia - MIC*    LEVOFLOXACIN 4 INTERMEDIATE Intermediate     TRIMETH/SULFA <=20 SENSITIVE Sensitive     * MODERATE STENOTROPHOMONAS MALTOPHILIA     ASSESSMENT: I suspect that his initial urinary tract infection and Enterococcal and Providencia bacteremias have been adequately treated now. He still has healthcare associated pneumonia with a recent sputum culture growing stenotrophomonas. I will continue trimethoprim sulfamethoxazole. We will need to watch his renal function closely.  PLAN: 1. Continue trimethoprim  sulfamethoxazole 2. Discontinue cefepime  Michel Bickers, MD [facility] [place] [facility] [phone] pager   [phone] cell 04/25/2017, 10:45 AM

## 2017-04-26 ENCOUNTER — Inpatient Hospital Stay (HOSPITAL_COMMUNITY): Payer: Medicare Other

## 2017-04-26 DIAGNOSIS — Z515 Encounter for palliative care: Secondary | ICD-10-CM

## 2017-04-26 DIAGNOSIS — Z7189 Other specified counseling: Secondary | ICD-10-CM

## 2017-04-26 LAB — GLUCOSE, CAPILLARY
GLUCOSE-CAPILLARY: 134 mg/dL — AB (ref 65–99)
GLUCOSE-CAPILLARY: 87 mg/dL (ref 65–99)
GLUCOSE-CAPILLARY: 95 mg/dL (ref 65–99)
GLUCOSE-CAPILLARY: 97 mg/dL (ref 65–99)
Glucose-Capillary: 84 mg/dL (ref 65–99)
Glucose-Capillary: 106 mg/dL — ABNORMAL HIGH (ref 65–99)
Glucose-Capillary: 91 mg/dL (ref 65–99)

## 2017-04-26 LAB — CULTURE, BLOOD (ROUTINE X 2)
CULTURE: NO GROWTH
Culture: NO GROWTH
Special Requests: ADEQUATE

## 2017-04-26 LAB — CBC
HCT: 22.9 % — ABNORMAL LOW (ref 39.0–52.0)
Hemoglobin: 7.6 g/dL — ABNORMAL LOW (ref 13.0–17.0)
MCH: 28.6 pg (ref 26.0–34.0)
MCHC: 33.2 g/dL (ref 30.0–36.0)
MCV: 86.1 fL (ref 78.0–100.0)
PLATELETS: 289 10*3/uL (ref 150–400)
RBC: 2.66 MIL/uL — AB (ref 4.22–5.81)
RDW: 14.1 % (ref 11.5–15.5)
WBC: 8.8 10*3/uL (ref 4.0–10.5)

## 2017-04-26 LAB — BASIC METABOLIC PANEL
ANION GAP: 6 (ref 5–15)
BUN: 19 mg/dL (ref 6–20)
CO2: 25 mmol/L (ref 22–32)
Calcium: 7.9 mg/dL — ABNORMAL LOW (ref 8.9–10.3)
Chloride: 103 mmol/L (ref 101–111)
Creatinine, Ser: 2.06 mg/dL — ABNORMAL HIGH (ref 0.61–1.24)
GFR, EST AFRICAN AMERICAN: 32 mL/min — AB (ref >60)
GFR, EST NON AFRICAN AMERICAN: 28 mL/min — AB (ref >60)
GLUCOSE: 92 mg/dL (ref 65–99)
POTASSIUM: 4.2 mmol/L (ref 3.5–5.1)
Sodium: 134 mmol/L — ABNORMAL LOW (ref 135–145)

## 2017-04-26 LAB — MAGNESIUM: Magnesium: 2 mg/dL (ref 1.7–2.4)

## 2017-04-26 LAB — PHOSPHORUS: PHOSPHORUS: 3.8 mg/dL (ref 2.5–4.6)

## 2017-04-26 MED ORDER — FUROSEMIDE 10 MG/ML IJ SOLN
40.0000 mg | Freq: Once | INTRAMUSCULAR | Status: AC
  Administered 2017-04-26: 40 mg via INTRAVENOUS
  Filled 2017-04-26: qty 4

## 2017-04-26 NOTE — Progress Notes (Signed)
PULMONARY / CRITICAL CARE MEDICINE   Name: Erik Massey MRN: [id number] DOB: 09/28/1929    ADMISSION DATE:  04/12/2017 CONSULTATION DATE:  6/6  REFERRING MD:  [name]   CHIEF COMPLAINT:  Shock  BRIEF SUMMARY:    83 year old man with dementia & recent admission 5/17-5/21 for urinary retention due to BPH and cystitis, discharged with indwelling Foley to SNF for rehabilitation. He was readmitted 6/5 with altered mental status, hypotension and lactate of 6 with WBC count of 19.6. He required fluids and Neo-Synephrine drip.  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).   Transferred back to ICU 6/9 for aspiration pneumonia, intubated for resp distress. Code status reversed by family.   STUDIES:  CXR 6/9 >> worsening bilateral opacities likely pleural effusion with pneumonia. 6/14 CxR>> worse airspace disease  CULTURES: BCx2 6/5 >> enterococcus faecalis (S-vanco), providencia stuartii (S-cefepime) C-Diff 6/5 >> antigen positive, toxin neg BCx2 6/7 >> neg UC 6/9 >> negative  resp 6/14 >> stenotrophomonas   ANTIBIOTICS: Cefepime  6/5 >> 6/7 Ceftx 6/7 >> 6/10 Levaquin 6/5 >> 6/5 Vancomycin 6/5 >>6/16 Cefepime 6/6 >> 6/18 Metronidazole 6/6 >> 6/11  Bactrim 6/16 >>  SIGNIFICANT EVENTS: 5/17 > 5/21 admit for urinary retention 6/05  Admit for sepsis 6/09  Rapid response. Intubated 6/10  Maintain on the vent overnight.  Soft BP but not on pressors 6/12  Weaning on PSV 5/5 6/13 palliative care consult 6/13 TF stopped per family request  LINES/TUBES: ETT 6/9 >>  SUBJECTIVE:   Remains critically ill, intubated  Diuresed well with lasix afebrile TFs on hold  VITAL SIGNS: BP (!) 114/42 (BP Location: Right Arm)   Pulse 89   Temp 97.5 F (36.4 C) (Axillary)   Resp 20   Ht 5\' 6"  (1.676 m)   Wt 152 lb 5.4 oz (69.1 kg)   SpO2 100%   BMI 24.59 kg/m   HEMODYNAMICS:   VENTILATOR SETTINGS: Vent Mode:  PSV FiO2 (%):  [30 %] 30 % Set Rate:  [16 bmp] 16 bmp Vt Set:  [510 mL] 510 mL PEEP:  [5 cmH20] 5 cmH20 Pressure Support:  [5 cmH20-8 cmH20] 5 cmH20 Plateau Pressure:  [7 cmH20-15 cmH20] 14 cmH20  INTAKE / OUTPUT: I/O last 3 completed shifts: In: 6734 [P.O.:6; I.V.:1245; NG/GT:1443; IV Piggyback:1140] Out: 1937 [Urine:5350; Emesis/NG output:600]  PHYSICAL EXAMINATION: General: elderly,frail,  no distress, oral ETT HENT: NCAT , no pallor, citerus PULM: BL scattered rhonchi, oral secretions + CV: RRR, no mgr GI: BS+, soft, nontender MSK: BUE contractures Neuro: does not follow commands,  grimace with pain  LABS:  BMET  Recent Labs Lab 04/22/17 0309 04/25/17 0318 04/26/17 0527  NA 134* 135 134*  K 3.5 4.6 4.2  CL 107 106 103  CO2 22 25 25   BUN 19 18 19   CREATININE 1.13 1.88* 2.06*  GLUCOSE 102* 128* 92    Electrolytes  Recent Labs Lab 04/22/17 0309 04/22/17 1320 04/22/17 1710 04/25/17 0318 04/26/17 0527  CALCIUM 7.6*  --   --  7.7* 7.9*  MG  --  1.6* 1.8  --  2.0  PHOS  --  3.1 2.7  --  3.8    CBC  Recent Labs Lab 04/21/17 0312 04/22/17 0309 04/26/17 0527  WBC 12.6* 12.8* 8.8  HGB 8.4* 7.6* 7.6*  HCT 24.8* 22.0* 22.9*  PLT 130* 164 289    Coag's No results for input(s): APTT, INR in the last 168 hours.  Sepsis Markers No results for input(s): LATICACIDVEN, PROCALCITON, O2SATVEN in the last 168 hours.  ABG No results for input(s): PHART, PCO2ART, PO2ART in the last 168 hours.  Liver Enzymes No results for input(s): AST, ALT, ALKPHOS, BILITOT, ALBUMIN in the last 168 hours.  Cardiac Enzymes No results for input(s): TROPONINI, PROBNP in the last 168 hours.  Glucose  Recent Labs Lab 04/25/17 1126 04/25/17 1542 04/25/17 1925 04/25/17 2344 04/26/17 0314 04/26/17 0803  GLUCAP 131* 95 93 113* 84 87    Imaging Dg Chest Port 1 View  Result Date: 04/26/2017 CLINICAL DATA:  Respiratory failure. EXAM: PORTABLE CHEST 1 VIEW COMPARISON:   04/24/2017. FINDINGS: Endotracheal tube 2.3 cm above the carina. Endotracheal tube is again directed toward the right mainstem bronchus but again appears to be above the carina. NG tube noted with tip below left hemidiaphragm. Side hole is at gastroesophageal junction. Advancement of approximately 8 cm should be considered. Stable cardiomegaly. Diffuse right lung and left mid and lower lung infiltrates/edema are again noted. Low lung volumes with basilar atelectasis . Small bilateral pleural effusions. No pneumothorax. Prominent skin fold on right. Stable deformity of the proximal right humerus. IMPRESSION: 1. Endotracheal tube is again noted to be directed towards right mainstem bronchus but is 2.3 cm above the carina. 2. NG tube noted with tip below left hemidiaphragm. Side-port is at the gastroesophageal junction. Advancement of approximately 8 cm should be considered. 3. Diffuse right lung and left mid and lower lung infiltrates again noted. Low lung volumes with basilar atelectasis. Small bilateral pleural effusions. Electronically Signed   By: Marcello Moores  Register   On: 04/26/2017 06:39     DISCUSSION: 83 year old male with dementia and recent admit for urinary retention presenting with septic shock-enterococcal + providencia bacteremia, likely source being UTI.  C-diff antigen positive, toxin negative > likely colonization-prior CT scan had shown proctocolitis.  Back to ICU 6/9, intubated after respiratory distress due to presumed aspiration/ HCAP  ASSESSMENT / PLAN:  RESPIRATORY  A: Acute respiratory failure with hypoxemia due to aspiration Likely acute pulmonary edema and transudative effusions P: SBTs - tolerates 5/5  VAP prevention Plan is for one way extubation Today after family arrive Feel that he will breathe but will likely be unable to handle secretions   CARDIOVASCULAR A:  Septic Shock - resolved Limited IV Access P:  tele   RENAL A:   AKI BPH P:   Continue foley Monitor  BMET and UOP Replace electrolytes as needed Cr increased with lasix & bactrim     GASTROINTESTINAL A:   Protein calorie malnutrition P:   Pepcid for SUP  Continue tube feeding  HEMATOLOGIC A:   Anemia P:  Monitor for bleeding  INFECTIOUS A:   Recent UTI C-Diff - favor colonization  HCAP -stenotrophomonas P:   Bactrim per ID - renal function may limit Monitor cultures   ENDOCRINE A:   Hypoglycemia Hypothyroidism  P:   dc tube feeding Continue Armour D10 @ 40  NEUROLOGIC A:   Acute encephalopathy Baseline dementia P:   RASS goal: 0 Minimize sedating meds PAD protocol: intermittent only Post extubation, may need morphine prn    FAMILY  - Updates: daughter updated 6/18 - she is agreeable to one way extubation after their meeting with palliative care on 6/19  -  Palliative care  - involved   My cc time 31 minutes  Kara Mead MD. [facility]. [facility] Pager [phone] If no response call [phone]  04/26/2017         

## 2017-04-26 NOTE — Progress Notes (Signed)
Patient ID: Erik Massey, male   DOB: 02/22/1929, 83 y.o.   MRN: [id number]          [facility] for Infectious Disease    Date of Admission:  04/12/2017    Total days of antibiotics 14        Day 3 trimethoprim sulfamethoxazole  Mr. Krapf remains on the ventilator with diffuse bilateral pulmonary infiltrates and a recent sputum culture growing stenotrophomonas. His creatinine is now up to 2.06. If it continues to rise I will stop trimethoprim sulfamethoxazole tomorrow and consider switching to tigecycline.         Michel Bickers, MD [facility] for Infectious [place] [facility] [phone] pager   [phone] cell 04/26/2017, 3:59 PM

## 2017-04-26 NOTE — Progress Notes (Signed)
Daily Progress Note   Patient Name: Erik Massey       Date: 04/26/2017 DOB: [birth year]  Age: 83 y.o. MRN#: [id number] Attending Physician: [name], MD Primary Care Physician: Glendale Chard, MD Admit Date: 04/12/2017  Reason for Consultation/Follow-up: Establishing goals of care  Subjective: Remains on the vent, one eye closed, has contractures Does not open eyes, or respond to verbal or tactile stimulation See below:  Length of Stay: 14  Current Medications: Scheduled Meds:  . betaxolol  1 drop Right Eye Daily  . carBAMazepine  200 mg Per Tube QID  . chlorhexidine gluconate (MEDLINE KIT)  15 mL Mouth Rinse BID  . dorzolamide  1 drop Right Eye Q12H  . finasteride  5 mg Oral Daily  . latanoprost  1 drop Right Eye QHS  . magnesium oxide  400 mg Per Tube Daily  . mouth rinse  15 mL Mouth Rinse QID  . multivitamin  15 mL Per Tube Daily  . ranitidine  150 mg Per Tube QHS  . tamsulosin  0.4 mg Oral Daily  . thyroid  90 mg Per Tube Daily    Continuous Infusions: . dextrose 30 mL/hr at 04/26/17 1300  . sulfamethoxazole-trimethoprim Stopped (04/26/17 0943)    PRN Meds: acetaminophen **OR** acetaminophen, fentaNYL (SUBLIMAZE) injection, fentaNYL (SUBLIMAZE) injection, ondansetron **OR** ondansetron (ZOFRAN) IV  Physical Exam         Elderly gentleman On the vent Regular Clear shallow anterior breath sounds Has edema Has contractures Not responsive to my exam  Vital Signs: BP (!) 100/40 (BP Location: Right Leg)   Pulse 89   Temp 98.9 F (37.2 C) (Axillary)   Resp (!) 21   Ht _0  (1.676 m)   Wt 69.1 kg (152 lb 5.4 oz)   SpO2 98%   BMI 24.59 kg/m  SpO2: SpO2: 98 % O2 Device: O2 Device: Ventilator O2 Flow Rate: O2 Flow Rate (L/min): 15  L/min  Intake/output summary:   Intake/Output Summary (Last 24 hours) at 04/26/17 1427 Last data filed at 04/26/17 1300  Gross per 24 hour  Intake             1969 ml  Output             3650 ml  Net            -  1681 ml   LBM: Last BM Date: 04/23/17 Baseline Weight: Weight: 59.6 kg (131 lb 6.3 oz) Most recent weight: Weight: 69.1 kg (152 lb 5.4 oz)       Palliative Assessment/Data:    Flowsheet Rows     Most Recent Value  Intake Tab  Referral Department  Critical care  Unit at Time of Referral  ICU  Palliative Care Primary Diagnosis  Other (Comment) [dementia, recurrent infections, VDRF ]  Date Notified  04/20/17  Palliative Care Type  Return patient Palliative Care  Reason for referral  Clarify Goals of Care  Date of Admission  04/12/17  Date first seen by Palliative Care  04/20/17  # of days IP prior to Palliative referral  8  Clinical Assessment  Palliative Performance Scale Score  10%  Pain Max last 24 hours  6  Pain Min Last 24 hours  4  Dyspnea Max Last 24 Hours  4  Dyspnea Min Last 24 hours  3  Anxiety Max Last 24 Hours  6  Anxiety Min Last 24 Hours  4  Psychosocial & Spiritual Assessment  Palliative Care Outcomes  Patient/Family meeting held?  Yes  Who was at the meeting?  discussed with patient's daughter over the phone.   Palliative Care Outcomes  Clarified goals of care      Patient Active Problem List   Diagnosis Date Noted  . HCAP (healthcare-associated pneumonia) 04/18/2017  . Respiratory failure ([place])   . Sebaceous cyst   . Dementia 04/14/2017  . Thrombocytopenia ([place]) 04/14/2017  . Enterococcal bacteremia 04/14/2017  . Gram-negative bacteremia 04/14/2017  . Moderate protein-calorie malnutrition ([place]) 04/14/2017  . Sepsis ([place]) 04/12/2017  . Normochromic normocytic anemia 04/12/2017  . Diarrhea 04/12/2017  . ARF (acute renal failure) ([place]) 03/24/2017  . Seizure ([place]) 10/20/2015  . UTI (urinary tract infection) 10/20/2015  . Physical  deconditioning   . 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    Recommendations/Plan:   I met today with patient's daughter, [name]. She has been discussing with CCM physicians as well as Dr. Rowe Pavy from our team regarding Mr. Yan's clinical course without significant recovery for the past several days.  She reports that she came to the hospital with anticipation for plan for one-way extubation, but she feels her father is showing more improvement today and wants to make sure that he is medically maximized prior to extubation.  She states that he has opened his eyes to command and squeezed her fingers, which is different than this weekend.  We also discussed increased secretions.  Will touch base to discuss this issue with CCM in AM.  ? If robinul may dry too much and lead to mucus plugging.  While she remains agreeable to one way extubation, his daughter feels that he has improved from yesterday to today and wants to continue to monitor his progress with current therapies prior to extubation.  We discussed plan to continue to evaluate daily as he is likely close to medically maximized, and waiting too long may result in him being past point of maximization to deconditioning.  Will continue to follow daily.   Code Status:    Code Status Orders  Start     Ordered   04/17/17 1350  Limited resuscitation (code)  Continuous    Question Answer Comment  In the event of cardiac or respiratory ARREST: Initiate Code Blue, Call Rapid Response No   In the event of cardiac or respiratory ARREST: Perform CPR No   In the event of cardiac or respiratory ARREST: Perform Intubation/Mechanical Ventilation Yes    In the event of cardiac or respiratory ARREST: Use NIPPV/BiPAp only if indicated Yes   In the event of cardiac or respiratory ARREST: Administer ACLS medications if indicated No   In the event of cardiac or respiratory ARREST: Perform Defibrillation or Cardioversion if indicated No      04/17/17 1349    Code Status History    Date Active Date Inactive Code Status Order ID Comments User Context   04/17/2017 12:17 AM 04/17/2017  1:49 PM Partial Code [id number]  Luz Brazen, MD Inpatient   04/16/2017 11:37 PM 04/17/2017 12:17 AM Full Code [id number]  Vianne Bulls, MD Inpatient   04/13/2017  1:22 AM 04/16/2017 11:37 PM DNR [id number]  Rise Patience, MD Inpatient   04/12/2017 11:12 PM 04/13/2017  1:19 AM DNR [id number]  Rise Patience, MD ED   04/12/2017  5:23 PM 04/12/2017 11:12 PM DNR [id number]  Fredia Sorrow, MD ED   03/24/2017 11:01 PM 03/28/2017  9:06 PM DNR [id number]  Rise Patience, MD ED   10/21/2015  6:42 PM 10/24/2015  5:41 PM DNR [id number]  Micheline Rough, MD Inpatient   10/20/2015  9:00 AM 10/21/2015  6:42 PM Full Code [id number]  Willia Craze, NP ED   10/20/2015  8:27 AM 10/20/2015  8:27 AM Full Code [id number]  Willia Craze, NP ED    Advance Directive Documentation     Most Recent Value  Type of Advance Directive  Out of facility DNR (pink MOST or yellow form)  Pre-existing out of facility DNR order (yellow form or pink MOST form)  Yellow form placed in chart (order not valid for inpatient use)  "MOST" Form in Place?  -       Prognosis:   guarded  Discharge Planning: To be determined.  It is likely for hospital death versus need for residential hospice following one way extubation.  Care plan was discussed with patient's daughter Dr Heath Gold, patient's bedside RN.   Thank you for allowing the Palliative Medicine Team to assist in the care of this patient.   Time In: 1330 Time Out: 1415 Total Time 45 Prolonged Time Billed  no       Greater than 50%   of this time was spent counseling and coordinating care related to the above assessment and plan.  Micheline Rough, MD [place] Team [phone]  Please contact Palliative Medicine Team phone at [phone] for questions and concerns.

## 2017-04-27 LAB — CBC
HCT: 21.8 % — ABNORMAL LOW (ref 39.0–52.0)
HEMOGLOBIN: 7.5 g/dL — AB (ref 13.0–17.0)
MCH: 29.6 pg (ref 26.0–34.0)
MCHC: 34.4 g/dL (ref 30.0–36.0)
MCV: 86.2 fL (ref 78.0–100.0)
Platelets: 316 10*3/uL (ref 150–400)
RBC: 2.53 MIL/uL — AB (ref 4.22–5.81)
RDW: 13.8 % (ref 11.5–15.5)
WBC: 8.6 10*3/uL (ref 4.0–10.5)

## 2017-04-27 LAB — GLUCOSE, CAPILLARY
GLUCOSE-CAPILLARY: 84 mg/dL (ref 65–99)
GLUCOSE-CAPILLARY: 80 mg/dL (ref 65–99)
GLUCOSE-CAPILLARY: 98 mg/dL (ref 65–99)
GLUCOSE-CAPILLARY: 104 mg/dL — AB (ref 65–99)
GLUCOSE-CAPILLARY: 116 mg/dL — AB (ref 65–99)
Glucose-Capillary: 93 mg/dL (ref 65–99)

## 2017-04-27 LAB — BASIC METABOLIC PANEL
Anion gap: 5 (ref 5–15)
Anion gap: 8 (ref 5–15)
BUN: 18 mg/dL (ref 6–20)
BUN: 18 mg/dL (ref 6–20)
CHLORIDE: 100 mmol/L — AB (ref 101–111)
CO2: 25 mmol/L (ref 22–32)
CO2: 27 mmol/L (ref 22–32)
CREATININE: 2.06 mg/dL — AB (ref 0.61–1.24)
Calcium: 7.9 mg/dL — ABNORMAL LOW (ref 8.9–10.3)
Calcium: 8 mg/dL — ABNORMAL LOW (ref 8.9–10.3)
Chloride: 101 mmol/L (ref 101–111)
Creatinine, Ser: 2.06 mg/dL — ABNORMAL HIGH (ref 0.61–1.24)
GFR calc Af Amer: 32 mL/min — ABNORMAL LOW (ref >60)
GFR calc non Af Amer: 28 mL/min — ABNORMAL LOW (ref >60)
GFR, EST AFRICAN AMERICAN: 32 mL/min — AB (ref >60)
GFR, EST NON AFRICAN AMERICAN: 28 mL/min — AB (ref >60)
Glucose, Bld: 89 mg/dL (ref 65–99)
Glucose, Bld: 90 mg/dL (ref 65–99)
Potassium: 3.7 mmol/L (ref 3.5–5.1)
Potassium: 3.7 mmol/L (ref 3.5–5.1)
SODIUM: 133 mmol/L — AB (ref 135–145)
SODIUM: 133 mmol/L — AB (ref 135–145)

## 2017-04-27 MED ORDER — FENTANYL CITRATE (PF) 100 MCG/2ML IJ SOLN
25.0000 ug | INTRAMUSCULAR | Status: DC | PRN
  Administered 2017-04-27 – 2017-04-28 (×4): 50 ug via INTRAVENOUS
  Filled 2017-04-27 (×5): qty 2

## 2017-04-27 MED ORDER — SCOPOLAMINE 1 MG/3DAYS TD PT72
1.0000 | MEDICATED_PATCH | TRANSDERMAL | Status: DC
  Administered 2017-04-27 – 2017-05-06 (×4): 1.5 mg via TRANSDERMAL
  Filled 2017-04-27 (×4): qty 1

## 2017-04-27 MED ORDER — HALOPERIDOL LACTATE 5 MG/ML IJ SOLN: 1.0000 mg | Freq: Four times a day (QID) | INTRAMUSCULAR | Status: DC | PRN

## 2017-04-27 MED ORDER — FENTANYL CITRATE (PF) 100 MCG/2ML IJ SOLN: 25.0000 ug | INTRAMUSCULAR | Status: DC | PRN

## 2017-04-27 NOTE — Progress Notes (Signed)
Nutrition Follow-up  DOCUMENTATION CODES:   Not applicable  INTERVENTION:  - Will continue to monitor POC/GOC.  NUTRITION DIAGNOSIS:   Inadequate oral intake related to inability to eat as evidenced by NPO status. -ongoing  GOAL:   Patient will meet greater than or equal to 90% of their needs -unable to meet  MONITOR:   Vent status, Weight trends, Labs, I & O's  ASSESSMENT:   83 y.o. male with history of dementia, hypothyroidism, seizure disorder who was recently admitted for abdominal pain and had CT scan showing left upper quadrant mass. As per the patient's daughter, patient was doing fine until 2 days ago when patient became increasingly lethargic weak and more bed bound. Patient was brought to the occupational getting hypotensive and lethargic and increasing hematuria. Pt intubated early AM 6/10 d/t respiratory failure following aspiration.  6/20 Pt remains intubated with OGT in place. TF has been off since 6/18 AM. TF order d/c'ed prior to this AM. Reviewed Palliative Care note from yesterday afternoon and will continue to monitor plan. Weight -2.8 kg from 6/18 of which is -2 kg from yesterday. Pt has not been on any sedation.   Patient is currently intubated on ventilator support MV: 10.1 L/min Temp (24hrs), Avg:98.6 F (37 C), Min:98.1 F (36.7 C), Max:98.9 F (37.2 C) BP: 129/31 and MAP: 62  Medications reviewed; 40 mg IV Lasix x1 dose yesterday, 400 mg Mag-ox per OGT/day, 15 mL liquid multivitamin per OGT/day.  Labs reviewed; CBG: 84 mg/dL, Na: 134 mmol/L, creatinine: 2.06 mg/dL, Ca: 7.9 mg/dL, GFR: 32 mL/min.   IVF: D10 @ 30 mL/hr (245 kcal from dextrose).   6/18 Pt remains intubated with OGT in place. TF currently turned off and RN reports she turned it off a short time ago and that Dr. Elsworth Soho is going to talk with daughter this AM about plan concerning extubation; Dr. Anastasia Pall note yesterday stated possible one-way extubation within the next 24-48 hours.  Estimated needs updated this AM and are now based on current weight as weight has been stable since 6/11; previously based on admission weight of 59.6 kg. RN reports that pt may need NGT placement following extubation dependent on course following extubation; will keep current TF regimen in place.   Patient is currently intubated on ventilator support MV: 11.5 L/min Temp (24hrs), Avg:99.6 F (37.6 C), Min:98 F (36.7 C), Max:100.8 F (38.2 C) BP: 123/49 and MAP: 70 IVF: D5-NS @ 50 mL/hr (204 kcal from dextrose)   6/15 - Consult received as family would like TF to be restarted today.  - Pt has OGT in place.  - Spoke with RN who confirms plan to restart TF today.  - Pt had a BM today.  - RD will continue to monitor POC/GOC. Pt is currently a partial code.   Patient is currently intubated on ventilator support MV: 8.7L/min Temp (24hrs), Avg:99.8 F (37.7 C), Min:98.9 F (37.2 C), Max:101.5 F (38.6 C) BP: 160/54 and MAP: 88 IVF:D5-NS @ 50 mL/hr (204 kcal from dextrose).    Diet Order:   NPO  Skin:  Reviewed, no issues  Last BM:  6/19  Height:   Ht Readings from Last 1 Encounters:  04/18/17 5\' 6"  (1.676 m)    Weight:   Wt Readings from Last 1 Encounters:  04/27/17 147 lb 14.9 oz (67.1 kg)    Ideal Body Weight:  53.64 kg  BMI:  Body mass index is 23.88 kg/m.  Estimated Nutritional Needs:   Kcal:  2330  Protein:  81-101 grams (1.2-1.5 grams/kg)  Fluid:  >/= 1.4 L/day  EDUCATION NEEDS:   No education needs identified at this time    Jarome Matin, MS, RD, LDN, CNSC Inpatient Clinical Dietitian Pager # [phone] After hours/weekend pager # [phone]

## 2017-04-27 NOTE — Progress Notes (Signed)
I touched base with patient's daughter ([name]) today.    She requested addition of methylated folate, methylated B12, and liposomal glutathione to his regimen.  I told her that I will pass this along to his primary team for consideration.    Will continue to follow daily.  NO CHARGE NOTE  Micheline Rough, MD [place] Team [phone]

## 2017-04-27 NOTE — Progress Notes (Signed)
[facility] Progress Note Patient Name: Erik Massey DOB: 05-24-1929 MRN: [id number]   Date of Service  04/27/2017  HPI/Events of Note  Breath stacking Cont vent alarms Sedation stopped due to effect on mental status  eICU Interventions  Fent 25-50 prn q 2 Haldol prn     Intervention Category Major Interventions: Delirium, psychosis, severe agitation - evaluation and management  ALVA,RAKESH V. 04/27/2017, 9:25 PM

## 2017-04-27 NOTE — Progress Notes (Signed)
Date:  April 27, 2017  Chart reviewed for concurrent status and case management needs.  Will continue to follow patient progress.  Famil;y meeting on [phone] vent will reassess daily for progress and possible one way extubation/ family feel he has had some improvement and want to continue care until he is at a medical max. Discharge Planning: following for needs  Expected discharge date: [id number]  Velva Harman, BSN, [place], [place]

## 2017-04-27 NOTE — Progress Notes (Signed)
PULMONARY / CRITICAL CARE MEDICINE   Name: Erik Massey MRN: [id number] DOB: 12/16/1928    ADMISSION DATE:  04/12/2017 CONSULTATION DATE:  6/6  REFERRING MD:  [name]   CHIEF COMPLAINT:  Shock  BRIEF SUMMARY:    83 year old man with dementia & recent admission 5/17-5/21 for urinary retention due to BPH and cystitis, discharged with indwelling Foley to SNF for rehabilitation. He was readmitted 6/5 with altered mental status, hypotension and lactate of 6 with WBC count of 19.6. He required fluids and Neo-Synephrine drip.  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).   Transferred back to ICU 6/9 for aspiration pneumonia, intubated for resp distress. Code status reversed by family.   STUDIES:  CXR 6/9 >> worsening bilateral opacities likely pleural effusion with pneumonia. 6/14 CxR>> worse airspace disease  CULTURES: BCx2 6/5 >> enterococcus faecalis (S-vanco), providencia stuartii (S-cefepime) C-Diff 6/5 >> antigen positive, toxin neg BCx2 6/7 >> neg UC 6/9 >> negative  resp 6/14 >> stenotrophomonas   ANTIBIOTICS: Cefepime  6/5 >> 6/7 Ceftx 6/7 >> 6/10 Levaquin 6/5 >> 6/5 Vancomycin 6/5 >>6/16 Cefepime 6/6 >> 6/18 Metronidazole 6/6 >> 6/11  Bactrim 6/16 >>  SIGNIFICANT EVENTS: 5/17 > 5/21 admit for urinary retention 6/05  Admit for sepsis 6/09  Rapid response. Intubated 6/10  Maintain on the vent overnight.  Soft BP but not on pressors 6/12  Weaning on PSV 5/5 6/13 palliative care consult 6/13 TF stopped per family request 6/19 palliative care meeting (daughter felt he was improving) wanted more time to maximize medical baseline   LINES/TUBES: ETT 6/9 >>  SUBJECTIVE:   Not responding.  No new changes  VITAL SIGNS: BP (!) 129/31   Pulse 89   Temp 98.1 F (36.7 C) (Axillary)   Resp 18   Ht 5\' 6"  (1.676 m)   Wt 147 lb 14.9 oz (67.1 kg)   SpO2 99%   BMI 23.88 kg/m   HEMODYNAMICS:    VENTILATOR SETTINGS: Vent Mode: PRVC FiO2 (%):  [30 %] 30 % Set Rate:  [16 bmp] 16 bmp Vt Set:  [510 mL] 510 mL PEEP:  [5 cmH20] 5 cmH20 Pressure Support:  [5 cmH20-8 cmH20] 8 cmH20 Plateau Pressure:  [7 cmH20-11 cmH20] 11 cmH20  INTAKE / OUTPUT: I/O last 3 completed shifts: In: 2805 [I.V.:1070; NG/GT:175; IV Piggyback:1560] Out: 5000 [Urine:4100; Emesis/NG output:900]  PHYSICAL EXAMINATION: General appearance:  83 Year old  Male cachectic, contracted and minimally responsive Eyes: anicteric sclerae, moist conjunctivae; PERRL, EOMI bilaterally. Mouth:  membranes and no mucosal ulcerations; normal hard and soft palate, orally intubated.  Neck: Trachea midline; neck supple, no JVD Lungs/chest: diffuse rhonchi  CV: RRR, no MRGs  Abdomen: Soft, non-tender; no masses or HSM Extremities: No peripheral edema or extremity lymphadenopathy Skin: Normal temperature, turgor and texture; no rash, ulcers or subcutaneous nodules Neuro/Psych: contracted and minimally responsive  LABS:  BMET  Recent Labs Lab 04/22/17 0309 04/25/17 0318 04/26/17 0527  NA 134* 135 134*  K 3.5 4.6 4.2  CL 107 106 103  CO2 22 25 25   BUN 19 18 19   CREATININE 1.13 1.88* 2.06*  GLUCOSE 102* 128* 92    Electrolytes  Recent Labs Lab 04/22/17 0309 04/22/17 1320 04/22/17 1710 04/25/17 0318 04/26/17 0527  CALCIUM 7.6*  --   --  7.7* 7.9*  MG  --  1.6* 1.8  --  2.0  PHOS  --  3.1 2.7  --  3.8  CBC  Recent Labs Lab 04/21/17 0312 04/22/17 0309 04/26/17 0527  WBC 12.6* 12.8* 8.8  HGB 8.4* 7.6* 7.6*  HCT 24.8* 22.0* 22.9*  PLT 130* 164 289    Coag's No results for input(s): APTT, INR in the last 168 hours.  Sepsis Markers No results for input(s): LATICACIDVEN, PROCALCITON, O2SATVEN in the last 168 hours.  ABG No results for input(s): PHART, PCO2ART, PO2ART in the last 168 hours.  Liver Enzymes No results for input(s): AST, ALT, ALKPHOS, BILITOT, ALBUMIN in the last 168  hours.  Cardiac Enzymes No results for input(s): TROPONINI, PROBNP in the last 168 hours.  Glucose  Recent Labs Lab 04/26/17 1122 04/26/17 1517 04/26/17 1920 04/26/17 2336 04/27/17 0316 04/27/17 0822  GLUCAP 106* 91 97 134* 84 80    Imaging Dg Abd 1 View  Result Date: 04/26/2017 CLINICAL DATA:  Check nasogastric catheter placement EXAM: ABDOMEN - 1 VIEW COMPARISON:  04/24/2017 FINDINGS: Scattered large and small bowel gas is noted. Nasogastric catheter is coiled within the stomach. No free air is seen. No bony abnormality is noted. IMPRESSION: Nasogastric catheter coiled within the stomach. These results were called by telephone at the time of interpretation on 04/26/2017 at 10:36 am to [facility], the patients nurse, who verbally acknowledged these results. Electronically Signed   By: [name] M.D.   On: 04/26/2017 10:36    ASSESSMENT / PLAN:  Acute respiratory failure with hypoxemia due to aspiration Likely acute pulmonary edema and transudative effusions HCAP -stenotrophomonas -his limited mental status and ability to protect airway/clear secretions are the barrier to extubation. He does wean well on PSV. Oral secretions have been a concern of his daughter.  -we are now day  Plan:   abx per ID. (possibly transition to tigecycline of cr still rising) PSV Dc sedating meds Scopolamine patch for oral secretions Will be one-way extubation when family all on board.  He is now day 11 orally intubated    Acute encephalopathy superimposed on baseline dementia -he is contracted and minimally responsive even to painful stim.  Plan   Dc sedating meds Low threshold to transition to morphine post-extubation for comfort.    Hypoglycemia Hypothyroidism  Plan:   Cont d10 and armour   Anemia Plan:  Monitor for bleeding  Protein calorie malnutrition Plan:   Cont pepcid   Holding tubefeeds in effort to extubate   AKI-->this is getting worse BPH Fluid and electrolyte  imbalance  Plan:   Keep foley Trend chem I&O  c-diff colonization  Plan Trend wbc curve   DISCUSSION:  83 year old male w/ advanced dementia. Admitted w/ urinary retention, septic shock, bacteremia and proctocolitis. Hospital course c/b aspiration event and Stenotrophomonas PNA. He has been on appropriate abx, weaning but his mental status and airway protection are his barriers to extubation. He is now day 11 on the vent. Palliative care is following. His daughter is aware of the poor prognosis but wishes to maximize him the most possible before we extubate him. I do think we should try to avoid keeping him orally intubated past the 2 week mark. Will cont to follow.   My cc time 3m  Erick Colace ACNP-BC [place] Pager # [phone] OR # [phone] if no answer    04/27/2017

## 2017-04-27 NOTE — Progress Notes (Addendum)
Patient ID: Erik Massey, male   DOB: Mar 15, 1929, 83 y.o.   MRN: [id number]          [facility] for Infectious Disease    Date of Admission:  04/12/2017    Total days of antibiotics 15        Day 4 trimethoprim sulfamethoxazole  Mr. Santana continues to do poorly on therapy for healthcare associated pneumonia. His repeat creatinine is pending. If it continues to rise I will stop trimethoprim sulfamethoxazole tomorrow and consider switching to tigecycline.         Michel Bickers, MD [place] [facility] [place] [facility] [phone] pager   [phone] cell 04/27/2017, 10:42 AM   Addendum:  BMET    Component Value Date/Time   NA 133 (L) 04/27/2017 0934   NA 133 (L) 04/27/2017 0934   NA 138 03/31/2017   K 3.7 04/27/2017 0934   K 3.7 04/27/2017 0934   CL 100 (L) 04/27/2017 0934   CL 101 04/27/2017 0934   CO2 25 04/27/2017 0934   CO2 27 04/27/2017 0934   GLUCOSE 90 04/27/2017 0934   GLUCOSE 89 04/27/2017 0934   BUN 18 04/27/2017 0934   BUN 18 04/27/2017 0934   BUN 20 03/31/2017   CREATININE 2.06 (H) 04/27/2017 0934   CREATININE 2.06 (H) 04/27/2017 0934   CALCIUM 7.9 (L) 04/27/2017 0934   CALCIUM 8.0 (L) 04/27/2017 0934   GFRNONAA 28 (L) 04/27/2017 0934   GFRNONAA 28 (L) 04/27/2017 0934   GFRAA 32 (L) 04/27/2017 0934   GFRAA 32 (L) 04/27/2017 0934   His creatinine is unchanged. I will continue trimethoprim sulfamethoxazole for now.  Michel Bickers, MD [facility] for Infectious [place] [facility] [phone] pager   [phone] cell 04/27/2017, 11:09 AM

## 2017-04-28 ENCOUNTER — Inpatient Hospital Stay (HOSPITAL_COMMUNITY): Payer: Medicare Other

## 2017-04-28 LAB — GLUCOSE, CAPILLARY
Glucose-Capillary: 76 mg/dL (ref 65–99)
Glucose-Capillary: 89 mg/dL (ref 65–99)
Glucose-Capillary: 140 mg/dL — ABNORMAL HIGH (ref 65–99)
Glucose-Capillary: 87 mg/dL (ref 65–99)
Glucose-Capillary: 89 mg/dL (ref 65–99)
Glucose-Capillary: 102 mg/dL — ABNORMAL HIGH (ref 65–99)

## 2017-04-28 LAB — PHOSPHORUS: Phosphorus: 3.8 mg/dL (ref 2.5–4.6)

## 2017-04-28 LAB — CREATININE, SERUM
CREATININE: 1.94 mg/dL — AB (ref 0.61–1.24)
GFR calc Af Amer: 35 mL/min — ABNORMAL LOW (ref >60)
GFR calc non Af Amer: 30 mL/min — ABNORMAL LOW (ref >60)

## 2017-04-28 LAB — MAGNESIUM: MAGNESIUM: 2.1 mg/dL (ref 1.7–2.4)

## 2017-04-28 MED ORDER — FERROUS SULFATE 300 (60 FE) MG/5ML PO SYRP
300.0000 mg | ORAL_SOLUTION | Freq: Two times a day (BID) | ORAL | Status: DC
  Administered 2017-04-28 – 2017-04-30 (×6): 300 mg
  Filled 2017-04-28 (×8): qty 5

## 2017-04-28 MED ORDER — FERROUS SULFATE 220 (44 FE) MG/5ML PO ELIX
220.0000 mg | ORAL_SOLUTION | Freq: Two times a day (BID) | ORAL | Status: DC
  Filled 2017-04-28 (×2): qty 5

## 2017-04-28 MED ORDER — PRO-STAT SUGAR FREE PO LIQD
30.0000 mL | Freq: Two times a day (BID) | ORAL | Status: DC
  Administered 2017-04-28 – 2017-04-29 (×3): 30 mL
  Filled 2017-04-28 (×3): qty 30

## 2017-04-28 MED ORDER — VITAL HIGH PROTEIN PO LIQD
1000.0000 mL | ORAL | Status: DC
  Administered 2017-04-28 – 2017-04-29 (×2): 1000 mL
  Filled 2017-04-28 (×2): qty 1000

## 2017-04-28 MED ORDER — VITAMIN C 500 MG PO TABS
250.0000 mg | ORAL_TABLET | Freq: Two times a day (BID) | ORAL | Status: DC
  Administered 2017-04-28 – 2017-04-30 (×6): 250 mg
  Filled 2017-04-28 (×6): qty 1

## 2017-04-28 MED ORDER — VITAMIN C 500 MG/5ML PO SYRP
250.0000 mg | ORAL_SOLUTION | Freq: Two times a day (BID) | ORAL | Status: DC
  Filled 2017-04-28: qty 2.5

## 2017-04-28 MED ORDER — L-METHYLFOLATE-B6-B12 3-35-2 MG PO TABS
1.0000 | ORAL_TABLET | Freq: Every day | ORAL | Status: DC
  Administered 2017-04-28 – 2017-05-06 (×9): 1
  Filled 2017-04-28 (×9): qty 1

## 2017-04-28 NOTE — Progress Notes (Signed)
NUTRITION NOTE  Full assessment/follow-up note by this RD yesterday with note at 7:50 AM. Pt remains on the vent with OGT in place. Reviewed Dr. Juanetta Gosling note from this AM and will continue to monitor POC/GOC. TF remains off at this time.    Jarome Matin, MS, RD, LDN, [facility] Inpatient Clinical Dietitian Pager # [phone] After hours/weekend pager # [phone]

## 2017-04-28 NOTE — Progress Notes (Signed)
Pharmacy Antibiotic Note  Erik Massey is a 83 y.o. male admitted on 04/12/2017 from nursing home with sepsis.  Patient went into respiratory distress (6/10) and is now re-intubated. Pharmacy is currently consulted to dose bactrim for HCAP.  Today, 04/28/2017: abx day #17, septra day #6. Creat 1.94, stable. AF,    Plan:  continue Bactrim to 320 mg IV q12h for renal function (~5 mg/kg/dose --dosing based on trimethoprim component)   Follow up renal fxn, culture results, and clinical course.  _____________________________________  Height: 5\' 6"  (167.6 cm) Weight: 144 lb 10 oz (65.6 kg) IBW/kg (Calculated) : 63.8  Temp (24hrs), Avg:98.3 F (36.8 C), Min:97.7 F (36.5 C), Max:98.8 F (37.1 C)   Recent Labs Lab 04/22/17 0309 04/25/17 0318 04/26/17 0527 04/27/17 0934 04/28/17 0536  WBC 12.8*  --  8.8 8.6  --   CREATININE 1.13 1.88* 2.06* 2.06*  2.06* 1.94*    Estimated Creatinine Clearance: 25.1 mL/min (A) (by C-G formula based on SCr of 1.94 mg/dL (H)).    Allergies  Allergen Reactions  . Penicillins Swelling    Has patient had a PCN reaction causing immediate rash, facial/tongue/throat swelling, SOB or lightheadedness with hypotension: yes Has patient had a PCN reaction causing severe rash involving mucus membranes or skin necrosis: no Has patient had a PCN reaction that required hospitalization- yes already in the hospital Has patient had a PCN reaction occurring within the last 10 years: No If all of the above answers are "NO", then may proceed with Cephalosporin use.    Antimicrobials this admission:  6/5 cefepime >> 6/7>> resume 6/10>> 6/5 vancomycin >> 6/16  6/7 CTX>>6/10 6/5 levofloxacin >> 6/6 6/6 PO vanc >>6/7 6/6 IV flagyl >>6/7>> resume 6/10>> 6/11 6/16 bactrim (stenotrop PNA)>>    Dose adjustments this admission:  6/6 increase vancomycin from 500mg  IV q24h to 750mg  q24h for improved SCr 6/7 increase vancomycin 750 mg q12h for improved SCr 6/8 VT  at 1149 (per RN) = 19  (on 750 mg q12h) 6/9 VT= 23 (on 750mg  IV q12h) -- reduced to 500 mg q12h 6/11 VT = (canceled, unable to obtain blood for labs) 6/12 1130 VT = 22.  Drawn ~4 hours early (last dose at 0330 with next dose due at 1600) d/t inability to obtain blood by lab and only PCCM able to get labs.  Extrapolated trough level ~ 18.5  Microbiology results:  6/5 BCx at 1850: 1/2 Providencia stuartii (S= cefe, ceftaz, CTX, imi, zosyn) (nothing on BCID) 6/5 BCx at 2220: 1/2 Enterococcus faecalis (S= amp, vanc) (BCID=enterococcus no resistance) FINAL 6/5 C.Diff Ag/PCR: POSITIVE, Toxin: NEGATIVE (no stools per RN but tx since WBC elevated per MD, = Colonization)  6/6 UCx: multiple species, need recollection FINAL 6/6 MRSA PCR: neg 6/7 bcx x2: neg FINAL 6/9 ucx: NGF 6/14 urine: NGF 6/14: Trach aspirate: Stenotrophomonas Maltophilia ( I to levoflox, S to Septra)  6/14: BCx x2: NGTD  Thank you for allowing pharmacy to be a part of this patient's care.  Eudelia Bunch, Pharm.D. [phone] 04/28/2017 1:06 PM

## 2017-04-28 NOTE — Progress Notes (Signed)
PULMONARY / CRITICAL CARE MEDICINE   Name: Erik Massey MRN: [id number] DOB: November 12, 1928    ADMISSION DATE:  04/12/2017 CONSULTATION DATE:  04/13/2017  REFERRING MD:  [name]   CHIEF COMPLAINT:  Shock  BRIEF SUMMARY:    83 yo male from SNF with altered mental status, lactic acidosis from septic shock and then developed HCAP with VDRF.  Had admission from 5/17 to 5/21 for urinary retention due to BPH and cystitis, and left with indwelling foley.  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  SUBJECTIVE:   Breath stacking over night on vent.  Given few doses of fentanyl.  VITAL SIGNS: BP (!) 175/57   Pulse 74   Temp 98.3 F (36.8 C) (Axillary)   Resp 17   Ht 5\' 6"  (1.676 m)   Wt 144 lb 10 oz (65.6 kg)   SpO2 100%   BMI 23.34 kg/m   VENTILATOR SETTINGS: Vent Mode: PRVC FiO2 (%):  [30 %] 30 % Set Rate:  [16 bmp] 16 bmp Vt Set:  [510 mL] 510 mL PEEP:  [5 cmH20] 5 cmH20 Pressure Support:  [5 cmH20] 5 cmH20 Plateau Pressure:  [9 cmH20-23 cmH20] 23 cmH20  INTAKE / OUTPUT: I/O last 3 completed shifts: In: 2740 [I.V.:1080; NG/GT:100; IV Piggyback:1560] Out: 3570 [Urine:3425; Emesis/NG output:625]  PHYSICAL EXAMINATION:  General - unresponsive Eyes - pupils pinpoint ENT - ETT in place Cardiac - regular, no murmur Chest - faint b/l crackles Abd - soft, non tender Ext - 1+ edema, contracted Skin - no rashes Neuro - does not follow commands  LABS:  BMET  Recent Labs Lab 04/25/17 0318 04/26/17 0527 04/27/17 0934 04/28/17 0536  NA 135 134* 133*  133*  --   K 4.6 4.2 3.7  3.7  --   CL 106 103 100*  101  --   CO2 25 25 25  27   --   BUN 18 19 18  18   --   CREATININE 1.88* 2.06* 2.06*  2.06* 1.94*  GLUCOSE 128* 92 90  89  --     Electrolytes  Recent Labs Lab 04/22/17 1320 04/22/17 1710 04/25/17 0318 04/26/17 0527 04/27/17 0934  CALCIUM  --   --  7.7* 7.9* 7.9*  8.0*  MG 1.6* 1.8  --  2.0  --   PHOS 3.1 2.7  --  3.8  --      CBC  Recent Labs Lab 04/22/17 0309 04/26/17 0527 04/27/17 0934  WBC 12.8* 8.8 8.6  HGB 7.6* 7.6* 7.5*  HCT 22.0* 22.9* 21.8*  PLT 164 289 316    Coag's No results for input(s): APTT, INR in the last 168 hours.  Sepsis Markers No results for input(s): LATICACIDVEN, PROCALCITON, O2SATVEN in the last 168 hours.  ABG No results for input(s): PHART, PCO2ART, PO2ART in the last 168 hours.  Liver Enzymes No results for input(s): AST, ALT, ALKPHOS, BILITOT, ALBUMIN in the last 168 hours.  Cardiac Enzymes No results for input(s): TROPONINI, PROBNP in the last 168 hours.  Glucose  Recent Labs Lab 04/27/17 1233 04/27/17 1719 04/27/17 1942 04/27/17 2327 04/28/17 0350 04/28/17 0752  GLUCAP 98 93 104* 116* 76 89    Imaging No results found.  STUDIES:   CULTURES: BCx2 6/5 >> enterococcus faecalis (S-vanco), providencia stuartii (S-cefepime) C-Diff 6/5 >> antigen positive, toxin neg BCx2 6/7 >> negative UC 6/9 >> negative  resp 6/14 >> stenotrophomonas   ANTIBIOTICS: Cefepime  6/5 >> 6/7 Ceftx 6/7 >> 6/10 Levaquin 6/5 >>  6/5 Vancomycin 6/5 >>6/16 Cefepime 6/6 >> 6/18 Metronidazole 6/6 >> 6/11  Bactrim 6/16 >>  SIGNIFICANT EVENTS: 6/05 Admit for sepsis 6/09 Rapid response >> Intubated 6/12 Weaning on PSV 5/5 6/13 palliative care consult 6/13 TF stopped per family request 6/19 palliative care meeting (daughter felt he was improving) wanted more time to maximize medical baseline  6/21 changed to pressure control  LINES/TUBES: ETT 6/9 >>  ASSESSMENT / PLAN:  Acute respiratory failure with hypoxia from HCAP with Stenotrophomonas. - mental status, and respiratory secretions are barrier to extubation >> I don't think these will improve anytime soon - changed to pressure control 6/21 - f/u CXR intermittently - will need to have d/w family about whether he is candidate for tracheostomy >> I would recommend against this - continue bactrim per ID -  scopolamine patch  Acute metabolic encephalopathy. Hx of dementia, seizures. - limit sedation/analgesia as able - monitor mental status - continue tegretol  Hypoglycemia. - continue D10 IV fluids  Hx of hypothyroidism. - continue armour thyroid  Anemia of critical illness, iron deficiency and chronic disease. - f/u CBC intermittently - feosol  Severe protein calorie malnutrition. - will need to d/w family if they would consider restarting tube feeds again  Acute renal failure, undetermined >> baseline creatinine 0.9 from 03/31/17. BPH with urine retention. - f/u BMET - continue foley - continue proscar, flomax  Enterococcal, Providencia bacteremia. - completed Abx  C diff colonization. - contact isolation  DVT prophylaxis - SCDs SUP - Zantac Nutrition - NPO Goals of care - No CPR, no defibrillation.  Palliative care consulted.  CC time 33 minutes  Chesley Mires, MD [facility] Pulmonary/Critical Care 04/28/2017, 8:52 AM Pager:  [phone] After 3pm call: [phone]

## 2017-04-28 NOTE — Progress Notes (Signed)
Patient ID: Erik Massey, male   DOB: Jul 09, 1929, 83 y.o.   MRN: [id number]          [facility] for Infectious Disease    Date of Admission:  04/12/2017    Total days of antibiotics 16        Day 5 trimethoprim sulfamethoxazole  Erik Massey continues to do poorly. However, his creatinine is down slightly today to 1.94. I recommend continuing trimethoprim sulfamethoxazole for 2 more days to complete 7 days of therapy for Stenotrophomonas HCAP. I have placed a stop order. I will sign off now.         Michel Bickers, MD [facility] for [place] [facility] [phone] pager   [phone] cell 04/28/2017, 4:56 PM

## 2017-04-28 NOTE — Progress Notes (Signed)
Palliative Medicine Progress Note  Reason for visit: goals of care in light of acute respiratory failure with hypoxia from HCAP and Stenotrophomonas  I saw and examined Mr. Causey this AM.  Still on vent with no meaningful interactions.  Reviewed case with Dr. Halford Chessman this AM.  I was later able to speak with his daughter, [name].  We discussed his clinical course as well as pathways forward.  We also reviewed specifics of care plan as below:  1. Started the nutritional supplements she requested that are on formulary.  We discussed that liposomal glutathione is not available from formulary. 2. Discussed tube feeds, secretions, and aspiration.  He is currently off of tube feeds.  We discussed restarting these if the goal is not going to be one way extubation.  She would like to discuss his condition today with bedside RN before making decision about tube feeds.  She will let RN know if she agrees to restarting tube feeds. 3. Discussed that secretions are going to continue to be an issue regardless of interventions and he is not likely to ever regain ability to protect airway.  Relayed my discussion with Dr. Halford Chessman that we are really at a point of extubation without plan for reintubation vs tracheostomy.  She does not think that her father would want a trach, and I recommended against this and let her know that note from PCCM also noted recommendation against trach.  She states that she will need to discuss with her mother, but she does not think that family would ever want to pursue trach placement.  Will follow-up again tomorrow.  Total time 30 minutes Greater than 50%  of this time was spent counseling and coordinating care related to the above assessment and plan.  Micheline Rough, MD [place] Team [phone]

## 2017-04-29 ENCOUNTER — Inpatient Hospital Stay (HOSPITAL_COMMUNITY): Payer: Medicare Other

## 2017-04-29 LAB — GLUCOSE, CAPILLARY
GLUCOSE-CAPILLARY: 97 mg/dL (ref 65–99)
GLUCOSE-CAPILLARY: 113 mg/dL — AB (ref 65–99)
GLUCOSE-CAPILLARY: 110 mg/dL — AB (ref 65–99)
GLUCOSE-CAPILLARY: 93 mg/dL (ref 65–99)
Glucose-Capillary: 96 mg/dL (ref 65–99)

## 2017-04-29 LAB — BASIC METABOLIC PANEL
ANION GAP: 6 (ref 5–15)
BUN: 22 mg/dL — ABNORMAL HIGH (ref 6–20)
CHLORIDE: 100 mmol/L — AB (ref 101–111)
CO2: 26 mmol/L (ref 22–32)
Calcium: 7.9 mg/dL — ABNORMAL LOW (ref 8.9–10.3)
Creatinine, Ser: 1.81 mg/dL — ABNORMAL HIGH (ref 0.61–1.24)
GFR calc Af Amer: 38 mL/min — ABNORMAL LOW (ref >60)
GFR, EST NON AFRICAN AMERICAN: 32 mL/min — AB (ref >60)
GLUCOSE: 105 mg/dL — AB (ref 65–99)
POTASSIUM: 3.7 mmol/L (ref 3.5–5.1)
Sodium: 132 mmol/L — ABNORMAL LOW (ref 135–145)

## 2017-04-29 LAB — CBC
HCT: 21.9 % — ABNORMAL LOW (ref 39.0–52.0)
Hemoglobin: 7.5 g/dL — ABNORMAL LOW (ref 13.0–17.0)
MCH: 29.6 pg (ref 26.0–34.0)
MCHC: 34.2 g/dL (ref 30.0–36.0)
MCV: 86.6 fL (ref 78.0–100.0)
PLATELETS: 327 10*3/uL (ref 150–400)
RBC: 2.53 MIL/uL — AB (ref 4.22–5.81)
RDW: 13.6 % (ref 11.5–15.5)
WBC: 7.2 10*3/uL (ref 4.0–10.5)

## 2017-04-29 LAB — MAGNESIUM: Magnesium: 2.1 mg/dL (ref 1.7–2.4)

## 2017-04-29 LAB — PHOSPHORUS: Phosphorus: 3.5 mg/dL (ref 2.5–4.6)

## 2017-04-29 MED ORDER — SULFAMETHOXAZOLE-TRIMETHOPRIM 200-40 MG/5ML PO SUSP
20.0000 mL | Freq: Two times a day (BID) | ORAL | Status: AC
  Administered 2017-04-29 – 2017-04-30 (×3): 20 mL
  Filled 2017-04-29 (×3): qty 20

## 2017-04-29 MED ORDER — VITAL AF 1.2 CAL PO LIQD
1000.0000 mL | ORAL | Status: DC
  Administered 2017-04-29 – 2017-05-06 (×9): 1000 mL
  Filled 2017-04-29 (×12): qty 1000

## 2017-04-29 NOTE — Progress Notes (Signed)
Nutrition Follow-up  DOCUMENTATION CODES:   Not applicable  INTERVENTION:  - Will change TF order: Vital AF 1.2 @ 50 mL/hr which provides 1440 kcal, 90 grams of protein, and 973 mL free water. - Will continue to monitor POC/GOC.  NUTRITION DIAGNOSIS:   Inadequate oral intake related to inability to eat as evidenced by NPO status. -ongoing  GOAL:   Patient will meet greater than or equal to 90% of their needs -met for protein, not for kcal.   MONITOR:   Vent status, TF tolerance, Weight trends, Labs, I & O's  REASON FOR ASSESSMENT:   Ventilator, Consult Enteral/tube feeding initiation and management  ASSESSMENT:   83 y.o. male with history of dementia, hypothyroidism, seizure disorder who was recently admitted for abdominal pain and had CT scan showing left upper quadrant mass. As per the patient's daughter, patient was doing fine until 2 days ago when patient became increasingly lethargic weak and more bed bound. Patient was brought to the occupational getting hypotensive and lethargic and increasing hematuria. Pt intubated early AM 6/10 d/t respiratory failure following aspiration.  6/22 Consult for re-start of TF. Spoke with RN who reports TF was running when she started her shift at 7 AM today and that pt has been tolerating it well so far. OGT remains in place. Weight +5.9 kg from admission; will use admission weight (59.6 kg) to estimate needs as pt with mild to severe edema throughout per RN assessment. Pt is currently order Vital High Protein @ 40 mL/hr with 30 mL Prostat BID per protocol; this is providing 1160 kcal, 114 grams of protein, and 803 mL free water.   Patient is currently intubated on ventilator support MV: 8.7 L/min Temp (24hrs), Avg:98.5 F (36.9 C), Min:98 F (36.7 C), Max:98.7 F (37.1 C) Propofol: none BP: 130/40 and MAP: 69  Medications reviewed; 300 mg ferrous sulfate per OGT BID, 400 mg Mag-ox per OGT/day, 15 mL liquid multivitamin per OGT/day,  250 mg ascorbic acid per OGT BID.  Labs reviewed; CBGs: 97 and 113 mg/dL today, Mg and Phos WDL yesterday.    6/21 - Pt remains on the vent with OGT in place.  - Reviewed Dr. Juanetta Gosling note from this AM and will continue to monitor POC/GOC.  - TF remains off at this time.  6/20 - Pt remains intubated with OGT in place.  - TF has been off since 6/18 AM.  - TF order d/c'ed prior to this AM.  - Reviewed Palliative Care note from yesterday afternoon and will continue to monitor plan. - Weight -2.8 kg from 6/18 of which is -2 kg from yesterday.  - Pt has not been on any sedation.   Patient is currently intubated on ventilator support MV: 10.1 L/min Temp (24hrs), Avg:98.6 F (37 C), Min:98.1 F (36.7 C), Max:98.9 F (37.2 C) BP: 129/31 and MAP: 62 IVF: D10 @ 30 mL/hr (245 kcal from dextrose).    Diet Order:   NPO  Skin:  Reviewed, no issues  Last BM:  6/22  Height:   Ht Readings from Last 1 Encounters:  04/18/17 _0  (1.676 m)    Weight:   Wt Readings from Last 1 Encounters:  04/29/17 144 lb 6.4 oz (65.5 kg)    Ideal Body Weight:  53.64 kg  BMI:  Body mass index is 23.31 kg/m.  Estimated Nutritional Needs:   Kcal:  1435  Protein:  81-101 grams (1.2-1.5 grams/kg)  Fluid:  >/= 1.4 L/day  EDUCATION NEEDS:   No  education needs identified at this time    Jarome Matin, MS, RD, LDN, [facility] Inpatient Clinical Dietitian Pager # [phone] After hours/weekend pager # [phone]

## 2017-04-29 NOTE — Progress Notes (Signed)
PULMONARY / CRITICAL CARE MEDICINE   Name: Erik Massey MRN: [id number] DOB: 01-Feb-1929    ADMISSION DATE:  04/12/2017 CONSULTATION DATE:  04/13/2017  REFERRING MD:  [name]   CHIEF COMPLAINT:  Shock  BRIEF SUMMARY:    83 yo male from SNF with altered mental status, lactic acidosis from septic shock and then developed HCAP with VDRF.  Had admission from 5/17 to 5/21 for urinary retention due to BPH and cystitis, and left with indwelling foley.  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  SUBJECTIVE:   No fentanyl for past 24 hours.  Tolerating pressure support.  VITAL SIGNS: BP (!) 136/51   Pulse 70   Temp 98.7 F (37.1 C) (Oral)   Resp 19   Ht 5\' 6"  (1.676 m)   Wt 144 lb 6.4 oz (65.5 kg)   SpO2 100%   BMI 23.31 kg/m   VENTILATOR SETTINGS: Vent Mode: PCV FiO2 (%):  [30 %] 30 % Set Rate:  [12 bmp] 12 bmp PEEP:  [5 cmH20] 5 cmH20 Pressure Support:  [5 cmH20] 5 cmH20 Plateau Pressure:  [20 cmH20-27 cmH20] 20 cmH20  INTAKE / OUTPUT: I/O last 3 completed shifts: In: 2779 [I.V.:637; NG/GT:582; IV Piggyback:1560] Out: 2980 [Urine:2980]  PHYSICAL EXAMINATION:  General - unresponsive Eyes - pupils reactive ENT - ETT in place Cardiac - regular, no murmur Chest - faint crackles b/l Abd - soft, non tender Ext - 1+ edema, contracted Skin - no rashes Neuro - doesn't follow commands  LABS:  BMET  Recent Labs Lab 04/25/17 0318 04/26/17 0527 04/27/17 0934 04/28/17 0536  NA 135 134* 133*  133*  --   K 4.6 4.2 3.7  3.7  --   CL 106 103 100*  101  --   CO2 25 25 25  27   --   BUN 18 19 18  18   --   CREATININE 1.88* 2.06* 2.06*  2.06* 1.94*  GLUCOSE 128* 92 90  89  --     Electrolytes  Recent Labs Lab 04/22/17 1710 04/25/17 0318 04/26/17 0527 04/27/17 0934 04/28/17 1406  CALCIUM  --  7.7* 7.9* 7.9*  8.0*  --   MG 1.8  --  2.0  --  2.1  PHOS 2.7  --  3.8  --  3.8    CBC  Recent Labs Lab 04/26/17 0527 04/27/17 0934   WBC 8.8 8.6  HGB 7.6* 7.5*  HCT 22.9* 21.8*  PLT 289 316    Glucose  Recent Labs Lab 04/28/17 1248 04/28/17 1614 04/28/17 1947 04/28/17 2316 04/29/17 0333 04/29/17 0849  GLUCAP 140* 87 89 102* 97 113*    Imaging Dg Abd 1 View  Result Date: 04/28/2017 CLINICAL DATA:  Orogastric tube placement EXAM: ABDOMEN - 1 VIEW COMPARISON:  April 26, 2017 FINDINGS: Orogastric tube tip and side port are in the stomach. Bowel gas pattern unremarkable. Visualized lung bases clear. No free air. IMPRESSION: Orogastric tube tip and side port in stomach. Visualized bowel gas pattern unremarkable. Electronically Signed   By: Lowella Grip III M.D.   On: 04/28/2017 12:53   Dg Chest Port 1 View  Result Date: 04/29/2017 CLINICAL DATA:  Respiratory failure. EXAM: PORTABLE CHEST 1 VIEW COMPARISON:  04/26/2017 . FINDINGS: Interim advancement of the NG tube, its tip is below left hemidiaphragm. Endotracheal tube in stable position. Heart size stable. Interim partial clearing of bilateral pulmonary infiltrates/edema. Interim improvement of bibasilar atelectasis. No pleural effusion or pneumothorax . IMPRESSION: 1. Interim  advancement of NG tube, its tip is below left hemidiaphragm. Endotracheal tube in stable position. 2. Interim partial clearing of bilateral pulmonary infiltrates/ edema. Interim improvement of bibasilar atelectasis. Electronically Signed   By: Marcello Moores  Register   On: 04/29/2017 06:35    STUDIES:   CULTURES: BCx2 6/5 >> enterococcus faecalis (S-vanco), providencia stuartii (S-cefepime) C-Diff 6/5 >> antigen positive, toxin neg BCx2 6/7 >> negative UC 6/9 >> negative  resp 6/14 >> stenotrophomonas   ANTIBIOTICS: Cefepime  6/5 >> 6/7 Ceftx 6/7 >> 6/10 Levaquin 6/5 >> 6/5 Vancomycin 6/5 >>6/16 Cefepime 6/6 >> 6/18 Metronidazole 6/6 >> 6/11  Bactrim 6/16 >>  SIGNIFICANT EVENTS: 6/05 Admit for sepsis 6/09 Rapid response >> Intubated 6/12 Weaning on PSV 5/5 6/13 palliative care  consult 6/13 TF stopped per family request 6/19 palliative care meeting (daughter felt he was improving) wanted more time to maximize medical baseline  6/21 changed to pressure control, restart tube feeds  LINES/TUBES: ETT 6/9 >>  DISCUSSION: 83 yo male with HCAP from Stenotrophomonas, bacteremia, VDRF.  Hx of dementia.  Mental status and respiratory secretions barrier to extubation.  Family considering goals of care, but likely would not want tracheostomy.  ASSESSMENT / PLAN:  Acute respiratory failure with hypoxia from HCAP with Stenotrophomonas. - pressure control as rest mode - pressure support wean as able - day 6/7 of bactrim - continue scopolamine patch - f/u CXR intermittently  Acute metabolic encephalopathy. Hx of dementia, seizures. - continue tegretol - monitor mental status - limit sedation/analgesia as able  Hypoglycemia. - improved after resuming tube feeds  Hx of hypothyroidism. - continue armour thyroid  Anemia of critical illness, iron deficiency and chronic disease. - feosol  Severe protein calorie malnutrition. - tube feeds resumed  Acute renal failure, undetermined >> baseline creatinine 0.9 from 03/31/17. BPH with urine retention. - f/u BMET - continue proscar, flomax - continue foley  Enterococcal, Providencia bacteremia. - completed Abx  C diff colonization. - contact isolation  DVT prophylaxis - SCDs SUP - Zantac Nutrition - tube feeds Goals of care - No CPR, no defibrillation.  Palliative care consulted.  Time spent 36 minutes  Chesley Mires, MD [place] 04/29/2017, 9:52 AM Pager:  [phone] After 3pm call: [phone]

## 2017-04-29 NOTE — Progress Notes (Signed)
Unable to touch base with daughter, [name], today.  Will attempt to call again tomorrow.  Micheline Rough, MD [place] Team [phone]

## 2017-04-30 ENCOUNTER — Inpatient Hospital Stay (HOSPITAL_COMMUNITY): Payer: Medicare Other

## 2017-04-30 LAB — GLUCOSE, CAPILLARY
GLUCOSE-CAPILLARY: 94 mg/dL (ref 65–99)
GLUCOSE-CAPILLARY: 95 mg/dL (ref 65–99)
Glucose-Capillary: 109 mg/dL — ABNORMAL HIGH (ref 65–99)
Glucose-Capillary: 109 mg/dL — ABNORMAL HIGH (ref 65–99)
Glucose-Capillary: 84 mg/dL (ref 65–99)
Glucose-Capillary: 96 mg/dL (ref 65–99)
Glucose-Capillary: 97 mg/dL (ref 65–99)

## 2017-04-30 LAB — CBC
HCT: 20.9 % — ABNORMAL LOW (ref 39.0–52.0)
Hemoglobin: 7.3 g/dL — ABNORMAL LOW (ref 13.0–17.0)
MCH: 30.3 pg (ref 26.0–34.0)
MCHC: 34.9 g/dL (ref 30.0–36.0)
MCV: 86.7 fL (ref 78.0–100.0)
PLATELETS: 366 10*3/uL (ref 150–400)
RBC: 2.41 MIL/uL — ABNORMAL LOW (ref 4.22–5.81)
RDW: 13.8 % (ref 11.5–15.5)
WBC: 7.4 10*3/uL (ref 4.0–10.5)

## 2017-04-30 LAB — BASIC METABOLIC PANEL
ANION GAP: 8 (ref 5–15)
BUN: 23 mg/dL — ABNORMAL HIGH (ref 6–20)
CALCIUM: 7.9 mg/dL — AB (ref 8.9–10.3)
CO2: 24 mmol/L (ref 22–32)
CREATININE: 1.81 mg/dL — AB (ref 0.61–1.24)
Chloride: 101 mmol/L (ref 101–111)
GFR, EST AFRICAN AMERICAN: 38 mL/min — AB (ref >60)
GFR, EST NON AFRICAN AMERICAN: 32 mL/min — AB (ref >60)
Glucose, Bld: 100 mg/dL — ABNORMAL HIGH (ref 65–99)
Potassium: 4.5 mmol/L (ref 3.5–5.1)
SODIUM: 133 mmol/L — AB (ref 135–145)

## 2017-04-30 LAB — MAGNESIUM: MAGNESIUM: 2.1 mg/dL (ref 1.7–2.4)

## 2017-04-30 LAB — PHOSPHORUS: PHOSPHORUS: 3.4 mg/dL (ref 2.5–4.6)

## 2017-04-30 MED ORDER — FENTANYL CITRATE (PF) 100 MCG/2ML IJ SOLN
25.0000 ug | INTRAMUSCULAR | Status: DC | PRN
Start: 2017-04-30 — End: 2017-05-06
  Administered 2017-05-02: 50 ug via INTRAVENOUS
  Filled 2017-04-30: qty 2

## 2017-04-30 MED ORDER — LORAZEPAM 2 MG/ML IJ SOLN: 0.5000 mg | INTRAMUSCULAR | Status: DC | PRN

## 2017-04-30 MED ORDER — ORAL CARE MOUTH RINSE
15.0000 mL | Freq: Two times a day (BID) | OROMUCOSAL | Status: DC
  Administered 2017-04-30 – 2017-05-05 (×9): 15 mL via OROMUCOSAL

## 2017-04-30 NOTE — Progress Notes (Signed)
PULMONARY / CRITICAL CARE MEDICINE   Name: Erik Massey MRN: [id number] DOB: July 12, 1929    ADMISSION DATE:  04/12/2017 CONSULTATION DATE:  04/13/2017  REFERRING MD:  [name]   CHIEF COMPLAINT:  Shock  BRIEF SUMMARY:    83 yo male from SNF with altered mental status, lactic acidosis from septic shock and then developed HCAP with VDRF.  Had admission from 5/17 to 5/21 for urinary retention due to BPH and cystitis, and left with indwelling foley.  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  SUBJECTIVE:   No sedatives/analgesics in last 48 hrs.  Tolerating pressure support.  VITAL SIGNS: BP (!) 142/57   Pulse 98   Temp 99.1 F (37.3 C) (Axillary)   Resp 17   Ht 5\' 6"  (1.676 m)   Wt 146 lb 13.2 oz (66.6 kg)   SpO2 98%   BMI 23.70 kg/m   VENTILATOR SETTINGS: Vent Mode: PSV;CPAP FiO2 (%):  [30 %] 30 % Set Rate:  [12 bmp] 12 bmp PEEP:  [5 cmH20] 5 cmH20 Pressure Support:  [5 cmH20] 5 cmH20 Plateau Pressure:  [19 cmH20-21 cmH20] 21 cmH20  INTAKE / OUTPUT: I/O last 3 completed shifts: In: 3097.5 [I.V.:100; NG/GT:1957.5; IV Piggyback:1040] Out: 2970 [Urine:2970]  PHYSICAL EXAMINATION:  General - opens eyes spontaneously Eyes - pupils reactive ENT - ETT in place, temporal wasting Cardiac - regular, no murmur Chest - b/l inspiratory/expiratory squeaks Abd - soft, non tender Ext - 1+ edema, contracted Skin - no rashes Neuro - does not follow commands  LABS:  BMET  Recent Labs Lab 04/27/17 0934 04/28/17 0536 04/29/17 0907 04/30/17 0344  NA 133*  133*  --  132* 133*  K 3.7  3.7  --  3.7 4.5  CL 100*  101  --  100* 101  CO2 25  27  --  26 24  BUN 18  18  --  22* 23*  CREATININE 2.06*  2.06* 1.94* 1.81* 1.81*  GLUCOSE 90  89  --  105* 100*    Electrolytes  Recent Labs Lab 04/27/17 0934 04/28/17 1406 04/29/17 0907 04/30/17 0344  CALCIUM 7.9*  8.0*  --  7.9* 7.9*  MG  --  2.1 2.1 2.1  PHOS  --  3.8 3.5 3.4     CBC  Recent Labs Lab 04/27/17 0934 04/29/17 1141 04/30/17 0344  WBC 8.6 7.2 7.4  HGB 7.5* 7.5* 7.3*  HCT 21.8* 21.9* 20.9*  PLT 316 327 366    Glucose  Recent Labs Lab 04/29/17 1246 04/29/17 1621 04/29/17 1943 04/30/17 0107 04/30/17 0443 04/30/17 0757  GLUCAP 110* 93 96 84 94 95    Imaging No results found.  STUDIES:   CULTURES: BCx2 6/5 >> enterococcus faecalis (S-vanco), providencia stuartii (S-cefepime) C-Diff 6/5 >> antigen positive, toxin neg BCx2 6/7 >> negative UC 6/9 >> negative  resp 6/14 >> stenotrophomonas   ANTIBIOTICS: Cefepime  6/5 >> 6/7 Ceftx 6/7 >> 6/10 Levaquin 6/5 >> 6/5 Vancomycin 6/5 >>6/16 Cefepime 6/6 >> 6/18 Metronidazole 6/6 >> 6/11  Bactrim 6/16 >>  SIGNIFICANT EVENTS: 6/05 Admit for sepsis 6/09 Rapid response >> Intubated 6/12 Weaning on PSV 5/5 6/13 palliative care consult 6/13 TF stopped per family request 6/19 palliative care meeting (daughter felt he was improving) wanted more time to maximize medical baseline  6/21 changed to pressure control, restart tube feeds  LINES/TUBES: ETT 6/9 >>  DISCUSSION: 83 yo male with HCAP from Stenotrophomonas, bacteremia, VDRF.  Hx of dementia.  Mental  status and respiratory secretions barrier to extubation.  Family considering goals of care, but likely would not want tracheostomy.  ASSESSMENT / PLAN:  Acute respiratory failure with hypoxia from HCAP with Stenotrophomonas. - pressure control as rest mode - pressure support wean as able - family deciding about long term plans for airway - day 7/7 bactrim - continue scopolamine patch for respiratory secretions  Acute metabolic encephalopathy. Hx of dementia, seizures. - continue tegretol - monitor mental status  Hypoglycemia. - improved after resuming tube feeds  Hx of hypothyroidism. - continue armour thyroid  Anemia of critical illness, iron deficiency and chronic disease. - feosol  Severe protein calorie  malnutrition. - continue tube feeds  Acute renal failure, undetermined >> baseline creatinine 0.9 from 03/31/17. BPH with urine retention. - f/u BMET - continue proscar, flomax - continue foley  Enterococcal, Providencia bacteremia. - completed Abx  C diff colonization. - contact isolation  DVT prophylaxis - SCDs SUP - Zantac Nutrition - tube feeds Goals of care - No CPR, no defibrillation.  Palliative care consulted.   Chesley Mires, MD [facility] Pulmonary/Critical Care 04/30/2017, 8:17 AM Pager:  [phone] After 3pm call: [phone]

## 2017-04-30 NOTE — Progress Notes (Signed)
Palliative Medicine Progress Note  Reason for visit: goals of care in light of acute respiratory failure with hypoxia from HCAP and Stenotrophomonas  I saw and examined Erik Massey this AM.  Still on vent with no meaningful interactions.  Discussed case with Dr. Halford Chessman this AM.    I spoke with his daughter, Erik Massey.  We discussed his clinical course as well as pathways forward.    1. Plan for one-way extubation with continuation of medical care but Full DNR/DNI in the event of decompensation.  Family is agreeable to proceeding with one way extubation today.  Family is coming to visit later, but no need to wait for family to proceed with extubation per his daughter. 2. Discussed tube feeds, secretions, and aspiration.  Discussed that secretions are going to continue to be an issue regardless of interventions and he is not likely to ever regain ability to protect airway.  We discussed that is in not a matter of if, but rather a matter of when, he aspirates.   3. He is on tube feeds.  His daughter would like to continue with tube feeds following extubation.  We discussed need to place NG tube which she is OK with.  PCCM updated.  Will continue to follow.  Total time 30 minutes Greater than 50%  of this time was spent counseling and coordinating care related to the above assessment and plan.  Micheline Rough, MD [place] Team [phone]

## 2017-04-30 NOTE — Progress Notes (Signed)
Paged by RN due to Increased RR >35 with elevated HR in the 120's on PSV 5/5. Patient placed back on full support of 20/5 rate of 12 and 30% PCV at this time.

## 2017-04-30 NOTE — Procedures (Signed)
Extubation Procedure Note  Patient Details:   Name: JOANTHONY HAMZA DOB: July 09, 1929 MRN: [id number]   Airway Documentation:     Evaluation  O2 sats: stable throughout Complications: No apparent complications Patient did tolerate procedure well. Bilateral Breath Sounds: Clear, Diminished    Extubation executed per MD order. Patient placed on 2L [place]. Cuff leak noted prior to extubation. No distress noted.   Mali M Donell Tomkins 04/30/2017, 12:28 PM

## 2017-05-01 LAB — GLUCOSE, CAPILLARY
GLUCOSE-CAPILLARY: 91 mg/dL (ref 65–99)
GLUCOSE-CAPILLARY: 94 mg/dL (ref 65–99)
GLUCOSE-CAPILLARY: 116 mg/dL — AB (ref 65–99)

## 2017-05-01 MED ORDER — ATROPINE SULFATE 1 % OP SOLN
4.0000 [drp] | OPHTHALMIC | Status: DC | PRN
  Filled 2017-05-01: qty 2

## 2017-05-01 NOTE — Progress Notes (Signed)
Palliative care  Saw Mr. Mallari briefly today to check for symptom management.  No signs of distress on exam.  No fentanyl or ativan usage overnight.  Nursing reports no concerns about discomfort at this time.  Plan to continue medical therapy and see how he progresses.  Full DNR in event of decompensation.  Will plan to f/u again tomorrow.  NO CHARGE NOTE  Micheline Rough, MD [place] Team [phone]

## 2017-05-01 NOTE — Progress Notes (Signed)
PULMONARY / CRITICAL CARE MEDICINE   Name: Erik Massey MRN: [id number] DOB: 06-09-1929    ADMISSION DATE:  04/12/2017 CONSULTATION DATE:  04/13/2017  REFERRING MD:  [name]   CHIEF COMPLAINT:  Shock  BRIEF SUMMARY:    83 yo male from SNF with altered mental status, lactic acidosis from septic shock and then developed HCAP with VDRF.  Had admission from 5/17 to 5/21 for urinary retention due to BPH and cystitis, and left with indwelling foley.  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  SUBJECTIVE:   Extubated yesterday.  VITAL SIGNS: BP (!) 147/46   Pulse 98   Temp 100 F (37.8 C) (Axillary)   Resp (!) 21   Ht 5\' 6"  (1.676 m)   Wt 145 lb 1 oz (65.8 kg)   SpO2 97%   BMI 23.41 kg/m   INTAKE / OUTPUT: I/O last 3 completed shifts: In: 1810 [NG/GT:1810] Out: 2975 [Urine:2975]  PHYSICAL EXAMINATION:  General - unresponsive Eyes - pupils pinpoint ENT - gurgling, NG tube in Cardiac - regular, no murmur Chest - b/l crackles Abd - soft, non tender Ext - 1+ edema, contracted Skin - no rashes Neuro - doesn't follow commands, non verbal  CULTURES: BCx2 6/5 >> enterococcus faecalis (S-vanco), providencia stuartii (S-cefepime) C-Diff 6/5 >> antigen positive, toxin neg BCx2 6/7 >> negative UC 6/9 >> negative  resp 6/14 >> stenotrophomonas   ANTIBIOTICS: Cefepime  6/5 >> 6/7 Ceftx 6/7 >> 6/10 Levaquin 6/5 >> 6/5 Vancomycin 6/5 >>6/16 Cefepime 6/6 >> 6/18 Metronidazole 6/6 >> 6/11  Bactrim 6/16 >> 6/23  SIGNIFICANT EVENTS: 6/05 Admit for sepsis 6/09 Rapid response >> Intubated 6/12 Weaning on PSV 5/5 6/13 palliative care consult 6/13 TF stopped per family request 6/19 palliative care meeting (daughter felt he was improving) wanted more time to maximize medical baseline  6/21 changed to pressure control, restart tube feeds 6/23 DNR/DNI, extubate with no reintubation  LINES/TUBES: ETT 6/9 >> 6/23  DISCUSSION: 83 yo male with HCAP  from Stenotrophomonas, bacteremia, VDRF.  Hx of dementia.  Extubated 6/23, and now DNR/DNI.  Respiratory status worse, and not able to control respiratory secretions after extubation.  ASSESSMENT: Acute respiratory failure with hypoxia from HCAP with Stenotrophomonas. Acute metabolic encephalopathy. Hx of dementia, seizures. Hypoglycemia. Hx of hypothyroidism. Anemia of critical illness, iron deficiency and chronic disease. Severe protein calorie malnutrition. Acute renal failure, undetermined >> baseline creatinine 0.9 from 03/31/17. BPH with urine retention. Enterococcal, Providencia bacteremia. C diff colonization.  PLAN: DNR/DNI Scopolamine patch, atropine gtt sublingual to control respiratory secretions Prn tylenol for fever Prn ativan, fentanyl Will need to d/w family about plans for tube feeds  Transfer to non tele floor bed.  Chesley Mires, MD [facility] Pulmonary/Critical Care 05/01/2017, 7:33 AM Pager:  [phone] After 3pm call: [phone]

## 2017-05-02 LAB — GLUCOSE, CAPILLARY: Glucose-Capillary: 76 mg/dL (ref 65–99)

## 2017-05-02 NOTE — Progress Notes (Signed)
[facility] PCCM  S:  Extubated, moved out of ICU over weekend; no distress this morning  O:  Vitals:   05/01/17 1321 05/01/17 1513 05/01/17 2139 05/02/17 0553  BP: (!) 104/58 (!) 98/50 113/79 (!) 119/58  Pulse: 81 76 87 89  Resp: 20 18 18 18   Temp: 99.1 F (37.3 C) 99.5 F (37.5 C) 98.8 F (37.1 C) 98.8 F (37.1 C)  TempSrc: Oral Oral Oral Oral  SpO2: 94% 100% 96% 97%  Weight:      Height:       2L [place]  Gen: chronically ill appearing HENT: NCAT, OP clear PULM: Clear to auscultation for me, slight increase work of breathing CV: RRR, no mgr, massive arm and leg edema GI: BS+, soft, nontender Derm: no cyanosis or rash Neuro: minimal eye opening to voice, doesn't follow commands   Assessment: HCAP Stenotrophomonas Dementia Deconditioning Protein calorie malnutritsion  Discussion: Family decided on extubation over weekend.  Palliative has been heavily involved, greatly appreciate their help.  He seems peaceful this morning.  Plan: Continue tube feeding Continue prn fentanyl/ativan dyspnea/agitation Continue scopolamine patch Continue addressing disposition with famiy  Will ask TRH service to assume care  Roselie Awkward, MD [place] PCCM Pager: [phone] Cell: [phone] After 3pm or if no response, call [phone]

## 2017-05-02 NOTE — Progress Notes (Signed)
Nutrition Follow-up  INTERVENTION:   - Continue Vital AF 1.2 @ 50 mL/hr which provides 1440 kcal, 90 grams of protein, and 973 mL free water as GOC allow. - Will continue to monitor POC/GOC.  NUTRITION DIAGNOSIS:   Inadequate oral intake related to inability to eat as evidenced by NPO status.  Ongoing.  GOAL:   Patient will meet greater than or equal to 90% of their needs  Meeting with TF.  MONITOR:   Labs, Weight trends, TF tolerance, I & O's (GOC)  ASSESSMENT:   83 y.o. male with history of dementia, hypothyroidism, seizure disorder who was recently admitted for abdominal pain and had CT scan showing left upper quadrant mass. As per the patient's daughter, patient was doing fine until 2 days ago when patient became increasingly lethargic weak and more bed bound. Patient was brought to the occupational getting hypotensive and lethargic and increasing hematuria. Pt intubated early AM 6/10 d/t respiratory failure following aspiration. 6/23: extubated  6/24: Patient continues to have infusion of Vital AF 1.2 @ 50 ml/hr via panda. Palliative care following patient for [place]. Pt now DNR and case management mentioning residential hospice in their most recent note. Will continue to follow for plan.  Medications: Metanx tablet daily  Labs reviewed: Low Na  6/22: -Consult for re-start of TF. Spoke with RN who reports TF was running when she started her shift at 7 AM today and that pt has been tolerating it well so far. OGT remains in place. Weight +5.9 kg from admission; will use admission weight (59.6 kg) to estimate needs as pt with mild to severe edema throughout per RN assessment. Pt is currently order Vital High Protein @ 40 mL/hr with 30 mL Prostat BID per protocol; this is providing 1160 kcal, 114 grams of protein, and 803 mL free water.  -Patient is currently intubated on ventilator support  Diet Order:     Skin:  Reviewed, no issues  Last BM:  6/25  Height:   Ht Readings  from Last 1 Encounters:  04/18/17 5\' 6"  (1.676 m)    Weight:   Wt Readings from Last 1 Encounters:  05/01/17 145 lb 1 oz (65.8 kg)    Ideal Body Weight:  53.64 kg  BMI:  Body mass index is 23.41 kg/m.  Estimated Nutritional Needs:   Kcal:  1500-1700  Protein:  70-80g  Fluid:  1.5L/day  EDUCATION NEEDS:   No education needs identified at this time  Clayton Bibles, MS, RD, LDN Pager: [phone] After Hours Pager: [phone]

## 2017-05-02 NOTE — Care Management Important Message (Signed)
Important Message  Patient Details IM Letter given to Suzanne/ Case Manager to present to [place] Message  Patient Details  Name: Erik Massey MRN: [id number] Date of Birth: 1929/05/18   Medicare Important Message Given:  Yes    Kerin Salen 05/02/2017, 11:38 AM  Name: Erik Massey MRN: [id number] Date of Birth: 1928-12-25   Medicare Important Message Given:  Yes    Kerin Salen 05/02/2017, 11:36 AMImportant Message  Patient Details  Name: Erik Massey MRN: [id number] Date of Birth: 04-18-1929   Medicare Important Message Given:  Yes    Kerin Salen 05/02/2017, 11:36 AM

## 2017-05-02 NOTE — Care Management Note (Signed)
Case Management Note  Patient Details  Name: FACUNDO ALLEMAND MRN: [id number] Date of Birth: [birth year]  Subjective/Objective:Extubated,DNR,unresponsive,Panda-continuous TF-Palliative following.Awaiting assistance w/disposition.From-[place]. Unable to return SNF w/panda. Residential hospice would be a reccomendation if agreed.                     Action/Plan:Recc Residential Hospice.   Expected Discharge Date:   (unknown)               Expected Discharge Plan:  Skilled Nursing Facility  In-House Referral:  Clinical Social Work  Discharge planning Services  CM Consult  Post Acute Care Choice:    Choice offered to:     DME Arranged:    DME Agency:     HH Arranged:    [place] Agency:     Status of Service:  In process, will continue to follow  If discussed at Long Length of Stay Meetings, dates discussed:    Additional Comments:  Dessa Phi, RN 05/02/2017, 10:27 AM

## 2017-05-02 NOTE — Progress Notes (Signed)
Palliative Medicine Progress Note  Reason for visit: goals of care in light of acute respiratory failure with hypoxia from HCAP and Stenotrophomonas  I saw and examined Erik Massey this afternoon.  Liberated from vent on Saturday, but still with no meaningful interactions.   I spoke with his daughter, Erik Massey, via phone.  We discussed his clinical course, including the fact that he is now off of ventilator, and the concern about his continued inability to take in adequate nutrition and hydration orally due to mental status.    We discussed that he has PANDA, which is temporary means of feeding, but not something that can be maintained long-term outside of the hospital.  We discussed option of pulling PANDA, letting him eat for comfort, and accepting that he will not be able to maintain his nutrition and/or will potentially aspirate.  If this were plan, I recommended that he would likely be well served by residential hospice.    We also discussed possibility of pursuing evaluation for PEG tube if goal is continuing feeds at long-term care facility.  We discussed that while it may add time to his life, I do not think this will not add quality to his life or result in him regaining prior functional or cognitive status.  She reports that she does not think her mother will accept not pursuing PEG if it is an option.  She will discuss this with her.  Areas of clarification for primary team/plan moving forward: 1. Hgb has been 7.3-7.5 over last several days.  While this is stable, she would like him to get "a unit or two" to see if it perks him up. 2. Discussed again about tube feeds, secretions, and aspiration.  Discussed that secretions are going to continue to be an issue regardless of interventions and tube feeding will not prevent aspiration.  We discussed that is in not a matter of if, but rather a matter of when, he aspirates.   3. He is on tube feeds.  His daughter wonders if we have  "maximized" his nutrition regimen.  I let her know that nutrition has been following and making recommendations as appropriate.  After discussion this evening, I think she is likely going to want to pursue a PEG tube (if he is a candidate) and placement in long-term care.  Will ask Erik Massey from PMT to follow-up tomorrow.  Total time 30 minutes Greater than 50%  of this time was spent counseling and coordinating care related to the above assessment and plan.  Erik Rough, MD [place] Team [phone]

## 2017-05-03 NOTE — Progress Notes (Signed)
Daily Progress Note   Patient Name: Erik Massey       Date: 05/03/2017 DOB: October 17, 1929  Age: 83 y.o. MRN#: [id number] Attending Physician: Aline August, MD Primary Care Physician: Glendale Chard, MD Admit Date: 04/12/2017  Reason for Consultation/Follow-up: Establishing goals of care  Subjective:  resting in bed with eyes open Does not track me in the room Does not respond to my voice  Call placed and discussed with daughter Dr Karlton Lemon, see below:  Length of Stay: 21  Current Medications: Scheduled Meds:  . betaxolol  1 drop Right Eye Daily  . carBAMazepine  200 mg Per Tube QID  . dorzolamide  1 drop Right Eye Q12H  . finasteride  5 mg Oral Daily  . l-methylfolate-B6-B12  1 tablet Per Tube Daily  . latanoprost  1 drop Right Eye QHS  . mouth rinse  15 mL Mouth Rinse BID  . ranitidine  150 mg Per Tube QHS  . scopolamine  1 patch Transdermal Q72H  . tamsulosin  0.4 mg Oral Daily  . thyroid  90 mg Per Tube Daily    Continuous Infusions: . feeding supplement (VITAL AF 1.2 CAL) 1,000 mL (05/03/17 1512)    PRN Meds: atropine, fentaNYL (SUBLIMAZE) injection, LORazepam, [DISCONTINUED] ondansetron **OR** ondansetron (ZOFRAN) IV  Physical Exam         Elderly gentleman Appears chronically ill Diminished breath sounds S1 S2 Abdomen soft Extremities with contractures, has edema  Vital Signs: BP 115/60 (BP Location: Right Arm)   Pulse 76   Temp 98 F (36.7 C) (Axillary)   Resp 14   Ht 5\' 6"  (1.676 m)   Wt 65.8 kg (145 lb 1 oz)   SpO2 100%   BMI 23.41 kg/m  SpO2: SpO2: 100 % O2 Device: O2 Device: Nasal Cannula O2 Flow Rate: O2 Flow Rate (L/min): 2 L/min  Intake/output summary:  Intake/Output Summary (Last 24 hours) at 05/03/17 1620 Last data filed at 05/03/17  1431  Gross per 24 hour  Intake              800 ml  Output             1900 ml  Net            -1100 ml   LBM: Last BM Date: 05/02/17 Baseline Weight:  Weight: 59.6 kg (131 lb 6.3 oz) Most recent weight: Weight: 65.8 kg (145 lb 1 oz)       Palliative Assessment/Data:    Flowsheet Rows     Most Recent Value  Intake Tab  Referral Department  Critical care  Unit at Time of Referral  ICU  Palliative Care Primary Diagnosis  Other (Comment) [dementia, recurrent infections, VDRF ]  Date Notified  04/20/17  Palliative Care Type  Return patient Palliative Care  Reason for referral  Clarify Goals of Care  Date of Admission  04/12/17  Date first seen by Palliative Care  04/20/17  # of days IP prior to Palliative referral  8  Clinical Assessment  Palliative Performance Scale Score  10%  Pain Max last 24 hours  6  Pain Min Last 24 hours  4  Dyspnea Max Last 24 Hours  4  Dyspnea Min Last 24 hours  3  Anxiety Max Last 24 Hours  6  Anxiety Min Last 24 Hours  4  Psychosocial & Spiritual Assessment  Palliative Care Outcomes  Patient/Family meeting held?  Yes  Who was at the meeting?  discussed with patient's daughter over the phone.   Palliative Care Outcomes  Clarified goals of care      Patient Active Problem List   Diagnosis Date Noted  . HCAP (healthcare-associated pneumonia) 04/18/2017  . Respiratory failure ([place])   . Sebaceous cyst   . Dementia 04/14/2017  . Thrombocytopenia ([place]) 04/14/2017  . Enterococcal bacteremia 04/14/2017  . Gram-negative bacteremia 04/14/2017  . Moderate protein-calorie malnutrition ([place]) 04/14/2017  . Sepsis ([place]) 04/12/2017  . Normochromic normocytic anemia 04/12/2017  . Diarrhea 04/12/2017  . ARF (acute renal failure) ([place]) 03/24/2017  . Seizure ([place]) 10/20/2015  . UTI (urinary tract infection) 10/20/2015  . Physical deconditioning   . BPH (benign prostatic hyperplasia)     Palliative Care Assessment & Plan   Patient Profile:     Assessment: 83 year male with history of dementia, hypothyroidism, seizure disorder, recent diagnosis of GIST being managed conservatively was admitted on 04/12/2017 for septic shock and HCAP. He has had complicated and prolonged hospitalization with VDRF, enterococcal and Providencia bacteremia, C. difficile colonization and tracheal culture growing stenotrophomonas treated with various broad-spectrum antibiotics. He was extubated and palliative care team has been following the patient. He was transferred out of ICU care on 05/02/2017. Hospitalist service is resuming care on 05/03/2017. He remains unresponsive with NG tube feeding. Palliative medicine team has been following.    Recommendations/Plan:   Call placed and discussed with daughter Dr Heath Gold:  Daughter is asking about repeating CBC, checking to see if the patient needs a blood transfusion  Daughter is asking about MRI brain to see if the patient has had a stroke.   We did discuss about the patient's several long term conditions that place him at high risk for acute decline, ongoing discussions with daughter about goals of care, goals are not comfort only at this point, daughter has discussed with case management regarding SNF placement.    Code Status:    Code Status Orders        Start     Ordered   04/30/17 1217  Do not attempt resuscitation (DNR)  Continuous    Question Answer Comment  In the event of cardiac or respiratory ARREST Do not call a "code blue"   In the event of cardiac or respiratory ARREST Do not perform Intubation, CPR, defibrillation or ACLS   In the  event of cardiac or respiratory ARREST Use medication by any route, position, wound care, and other measures to relive pain and suffering. May use oxygen, suction and manual treatment of airway obstruction as needed for comfort.      04/30/17 1217    Code Status History    Date Active Date Inactive Code Status Order ID Comments User Context   04/17/2017   1:49 PM 04/30/2017 12:17 PM Partial Code [id number]  [name], MD Inpatient   04/17/2017 12:17 AM 04/17/2017  1:49 PM Partial Code [id number]  Luz Brazen, MD Inpatient   04/16/2017 11:37 PM 04/17/2017 12:17 AM Full Code [id number]  Vianne Bulls, MD Inpatient   04/13/2017  1:22 AM 04/16/2017 11:37 PM DNR [id number]  Rise Patience, MD Inpatient   04/12/2017 11:12 PM 04/13/2017  1:19 AM DNR [id number]  Rise Patience, MD ED   04/12/2017  5:23 PM 04/12/2017 11:12 PM DNR [id number]  Fredia Sorrow, MD ED   03/24/2017 11:01 PM 03/28/2017  9:06 PM DNR [id number]  Rise Patience, MD ED   10/21/2015  6:42 PM 10/24/2015  5:41 PM DNR [id number]  Micheline Rough, MD Inpatient   10/20/2015  9:00 AM 10/21/2015  6:42 PM Full Code [id number]  Willia Craze, NP ED   10/20/2015  8:27 AM 10/20/2015  8:27 AM Full Code [id number]  Willia Craze, NP ED    Advance Directive Documentation     Most Recent Value  Type of Advance Directive  Out of facility DNR (pink MOST or yellow form)  Pre-existing out of facility DNR order (yellow form or pink MOST form)  Yellow form placed in chart (order not valid for inpatient use)  "MOST" Form in Place?  -       Prognosis:   guarded  Discharge Planning:  To Be Determined  Care plan was discussed with  daughter  Thank you for allowing the Palliative Medicine Team to assist in the care of this patient.   Time In: 1500 Time Out: 1525 Total Time 25 Prolonged Time Billed  no       Greater than 50%  of this time was spent counseling and coordinating care related to the above assessment and plan.  Loistine Chance, MD [phone]  Please contact Palliative Medicine Team phone at [phone] for questions and concerns.

## 2017-05-03 NOTE — Clinical Social Work Note (Signed)
Clinical Social Work Assessment  Patient Details  Name: Erik Massey MRN: [id number] Date of Birth: [birth year]  Date of referral:  05/03/17               Reason for consult:  Discharge Planning, Facility Placement                Permission sought to share information with:    Permission granted to share information::     Name::        Agency::     Relationship::     Contact Information:     Housing/Transportation Living arrangements for the past 2 months:  [place] of Information:  Adult Children Patient Interpreter Needed:  None Criminal Activity/Legal Involvement Pertinent to Current Situation/Hospitalization:  No - Comment as needed Significant Relationships:  Adult Children, Spouse Lives with:  Facility Resident Do you feel safe going back to the place where you live?  No (Family isn't pleased with care recieved at [facility] Need for family participation in patient care:  Yes (Comment)  Care giving concerns:  Family does not want pt to return to [facility]. Family is not pleased  with care pt received at [facility].    Social Worker assessment / plan:  Pt hospitalized from [facility] on 04/12/17 with Sepsis. Pt required intubation but is now extubated with DNR in place. Palliative Care Team has been following to assist with [name]. CSW spoke with pt's daughter, Dr. Karlton Massey , this afternoon to assist with d/c planning. Family reports that feeding tube placement is pending. LTC will be needed. Insurance coverage for placement reviewed with daughter.  Medicaid vs pvt pay for LTC also  reviewed with daughter. SNF search will be initiated once feeding tube is placed. CSW will continue to follow to assist with d/c planning needs.  Employment status:  Retired Forensic scientist:  Medicare PT Recommendations:  Not assessed at this time Information / Referral to community resources:     Patient/Family's Response to care:  SNF ( LTC )  Patient/Family's  Understanding of and Emotional Response to Diagnosis, Current Treatment, and Prognosis:  Spouse / daughter are aware of pt's medical status. Family is hopeful that pt's condition will improve.  Emotional Assessment Appearance:  Appears stated age Attitude/Demeanor/Rapport:  Unable to Assess Affect (typically observed):  Unable to Assess Orientation:   (Responds to pain.) Alcohol / Substance use:  Not Applicable Psych involvement (Current and /or in the community):  No (Comment)  Discharge Needs  Concerns to be addressed:  Discharge Planning Concerns Readmission within the last 30 days:  Yes Current discharge risk:  Terminally ill Barriers to Discharge:  Other (Feeding tube placement.)   Erik Massey  [phone] 05/03/2017, 3:35 PM

## 2017-05-03 NOTE — Progress Notes (Signed)
Patient ID: Erik Massey, male   DOB: 1928-11-24, 83 y.o.   MRN: [id number]  PROGRESS NOTE    Erik Massey  [name] DOB: Jun 23, 1929 DOA: 04/12/2017 PCP: Glendale Chard, MD   Brief Narrative:  83 year male with history of dementia, hypothyroidism, seizure disorder, recent diagnosis of GIST being managed conservatively was admitted on 04/12/2017 for septic shock and HCAP. He has had complicated and prolonged hospitalization with VDRF, enterococcal and Providencia bacteremia, C. difficile colonization and tracheal culture growing stenotrophomonas treated with various broad-spectrum antibiotics. He was extubated and palliative care team has been following the patient. He was transferred out of ICU care on 05/02/2017. Hospitalist service is resuming care on 05/03/2017. He remains unresponsive with NG tube feeding  Assessment & Plan:   Principal Problem:   HCAP (healthcare-associated pneumonia) Active Problems:   Seizure ([place])   UTI (urinary tract infection)   ARF (acute renal failure) ([facility])   Sepsis ([place])   Normochromic normocytic anemia   Diarrhea   Dementia   Thrombocytopenia ([facility])   Enterococcal bacteremia   Gram-negative bacteremia   Moderate protein-calorie malnutrition ([facility])   Sebaceous cyst   Respiratory failure ([place])  VDRF - Status post extubation. Patient is now DNR/DNI and being followed by palliative care. Overall prognosis is very poor  HCAP - Status post treatment with various antibiotics. Sputum culture had grown stenotrophomonas. Currently off antibiotics  Acute metabolic encephalopathy - Unclear source. Patient still almost unresponsive. - He will benefit from comfort measures if family agrees. We will follow further recommendations from palliative care - Will not order any labs  Severe generalized deconditioning and protein energy malnutrition - Currently getting NG tube feeding. Doubt that that PEG tube placement and feeding will improve his  quality of life   Other various comorbidities: Acute renal failure, Enterococcal and Providencia bacteremia,  dementia, seizures, C. difficile colonization  - Off antibiotics. Supportive care.  DVT prophylaxis: SCDs Code Status:  DO NOT RESUSCITATE Family Communication: None at bedside Disposition Plan: Unknown at this time  Consultants: PCCM, palliative care, ID   Procedures: Intubation and extubation   Antimicrobials: Cefepime  6/5 >> 6/7 Ceftx 6/7 >> 6/10 Levaquin 6/5 >> 6/5 Vancomycin 6/5 >>6/16 Cefepime 6/6 >> 6/18 Metronidazole 6/6 >> 6/11  Bactrim 6/16 >> 6/23   Subjective: Patient seen and examined at bedside. He is unresponsive, does not wake up on calling his name  Objective: Vitals:   05/02/17 2044 05/03/17 0336 05/03/17 0432 05/03/17 0602  BP: 109/69 108/67 109/64   Pulse: 78 74 72 91  Resp: 16 12 14    Temp: 98.3 F (36.8 C)  98.1 F (36.7 C)   TempSrc: Axillary  Axillary   SpO2: 100%   100%  Weight:      Height:        Intake/Output Summary (Last 24 hours) at 05/03/17 1258 Last data filed at 05/03/17 1028  Gross per 24 hour  Intake              800 ml  Output             1700 ml  Net             -900 ml   Filed Weights   04/29/17 0415 04/30/17 0400 05/01/17 0501  Weight: 65.5 kg (144 lb 6.4 oz) 66.6 kg (146 lb 13.2 oz) 65.8 kg (145 lb 1 oz)    Examination:  General exam: Appears Chronically ill; unresponsive Respiratory system: Bilateral decreased breath sound at  bases with scattered crackles Cardiovascular system: S1 & S2 heard, rate controlled.  Gastrointestinal system: Abdomen is nondistended, soft and nontender.  Extremities: Contracted, 1+ edema   Data Reviewed: I have personally reviewed following labs and imaging studies  CBC:  Recent Labs Lab 04/27/17 0934 04/29/17 1141 04/30/17 0344  WBC 8.6 7.2 7.4  HGB 7.5* 7.5* 7.3*  HCT 21.8* 21.9* 20.9*  MCV 86.2 86.6 86.7  PLT 316 327 102   Basic Metabolic Panel:  Recent  Labs Lab 04/27/17 0934 04/28/17 0536 04/28/17 1406 04/29/17 0907 04/30/17 0344  NA 133*  133*  --   --  132* 133*  K 3.7  3.7  --   --  3.7 4.5  CL 100*  101  --   --  100* 101  CO2 25  27  --   --  26 24  GLUCOSE 90  89  --   --  105* 100*  BUN 18  18  --   --  22* 23*  CREATININE 2.06*  2.06* 1.94*  --  1.81* 1.81*  CALCIUM 7.9*  8.0*  --   --  7.9* 7.9*  MG  --   --  2.1 2.1 2.1  PHOS  --   --  3.8 3.5 3.4   GFR: Estimated Creatinine Clearance: 26.9 mL/min (A) (by C-G formula based on SCr of 1.81 mg/dL (H)). Liver Function Tests: No results for input(s): AST, ALT, ALKPHOS, BILITOT, PROT, ALBUMIN in the last 168 hours. No results for input(s): LIPASE, AMYLASE in the last 168 hours. No results for input(s): AMMONIA in the last 168 hours. Coagulation Profile: No results for input(s): INR, PROTIME in the last 168 hours. Cardiac Enzymes: No results for input(s): CKTOTAL, CKMB, CKMBINDEX, TROPONINI in the last 168 hours. BNP (last 3 results) No results for input(s): PROBNP in the last 8760 hours. HbA1C: No results for input(s): HGBA1C in the last 72 hours. CBG:  Recent Labs Lab 04/30/17 2336 05/01/17 0401 05/01/17 0753 05/01/17 1207 05/01/17 1841  GLUCAP 109* 91 76 94 116*   Lipid Profile: No results for input(s): CHOL, HDL, LDLCALC, TRIG, CHOLHDL, LDLDIRECT in the last 72 hours. Thyroid Function Tests: No results for input(s): TSH, T4TOTAL, FREET4, T3FREE, THYROIDAB in the last 72 hours. Anemia Panel: No results for input(s): VITAMINB12, FOLATE, FERRITIN, TIBC, IRON, RETICCTPCT in the last 72 hours. Sepsis Labs: No results for input(s): PROCALCITON, LATICACIDVEN in the last 168 hours.  No results found for this or any previous visit (from the past 240 hour(s)).       Radiology Studies: No results found.      Scheduled Meds: . betaxolol  1 drop Right Eye Daily  . carBAMazepine  200 mg Per Tube QID  . dorzolamide  1 drop Right Eye Q12H  .  finasteride  5 mg Oral Daily  . l-methylfolate-B6-B12  1 tablet Per Tube Daily  . latanoprost  1 drop Right Eye QHS  . mouth rinse  15 mL Mouth Rinse BID  . ranitidine  150 mg Per Tube QHS  . scopolamine  1 patch Transdermal Q72H  . tamsulosin  0.4 mg Oral Daily  . thyroid  90 mg Per Tube Daily   Continuous Infusions: . feeding supplement (VITAL AF 1.2 CAL) 1,000 mL (05/02/17 2313)     LOS: 21 days        Aline August, MD [facility] Pager [phone]  If 7PM-7AM, please contact night-coverage [email or web address] Password [facility] 05/03/2017, 12:58 PM

## 2017-05-04 ENCOUNTER — Inpatient Hospital Stay (HOSPITAL_COMMUNITY): Payer: Medicare Other

## 2017-05-04 LAB — CBC
HCT: 20.8 % — ABNORMAL LOW (ref 39.0–52.0)
HEMOGLOBIN: 7 g/dL — AB (ref 13.0–17.0)
MCH: 29.3 pg (ref 26.0–34.0)
MCHC: 33.7 g/dL (ref 30.0–36.0)
MCV: 87 fL (ref 78.0–100.0)
Platelets: 230 10*3/uL (ref 150–400)
RBC: 2.39 MIL/uL — ABNORMAL LOW (ref 4.22–5.81)
RDW: 14.1 % (ref 11.5–15.5)
WBC: 6.2 10*3/uL (ref 4.0–10.5)

## 2017-05-04 LAB — BASIC METABOLIC PANEL
Anion gap: 8 (ref 5–15)
BUN: 27 mg/dL — AB (ref 6–20)
CALCIUM: 8.5 mg/dL — AB (ref 8.9–10.3)
CO2: 27 mmol/L (ref 22–32)
Chloride: 104 mmol/L (ref 101–111)
Creatinine, Ser: 1.03 mg/dL (ref 0.61–1.24)
GFR calc Af Amer: >60 mL/min (ref >60)
GLUCOSE: 101 mg/dL — AB (ref 65–99)
Potassium: 4.1 mmol/L (ref 3.5–5.1)
Sodium: 139 mmol/L (ref 135–145)

## 2017-05-04 LAB — CARBAMAZEPINE LEVEL, TOTAL: Carbamazepine Lvl: 5.7 ug/mL (ref 4.0–12.0)

## 2017-05-04 LAB — T4, FREE: Free T4: 0.5 ng/dL — ABNORMAL LOW (ref 0.61–1.12)

## 2017-05-04 LAB — TSH: TSH: 16.79 u[IU]/mL — ABNORMAL HIGH (ref 0.350–4.500)

## 2017-05-04 LAB — PREPARE RBC (CROSSMATCH)

## 2017-05-04 LAB — VITAMIN B12: Vitamin B-12: 2363 pg/mL — ABNORMAL HIGH (ref 180–914)

## 2017-05-04 MED ORDER — SODIUM CHLORIDE 0.9 % IV SOLN: Freq: Once | INTRAVENOUS | Status: DC

## 2017-05-04 NOTE — Progress Notes (Addendum)
Patient ID: Erik Massey, male   DOB: 03-Jan-1929, 83 y.o.   MRN: [id number]  PROGRESS NOTE    Erik Massey  GMW:[id number] DOB: September 26, 1929 DOA: 04/12/2017 PCP: Glendale Chard, MD   Brief Narrative:  83 year male with history of dementia, hypothyroidism, seizure disorder, recent diagnosis of GIST being managed conservatively was admitted on 04/12/2017 for septic shock and HCAP. He has had complicated and prolonged hospitalization with VDRF, enterococcal and Providencia bacteremia, C. difficile colonization and tracheal culture growing stenotrophomonas treated with various broad-spectrum antibiotics. He was extubated and palliative care team has been following the patient. He was transferred out of ICU care on 05/02/2017. Hospitalist service is resuming care on 05/03/2017. He remains unresponsive with NG tube feeding  Assessment & Plan:   Principal Problem:   HCAP (healthcare-associated pneumonia) Active Problems:   Seizure ([place])   UTI (urinary tract infection)   ARF (acute renal failure) (HCC)   Sepsis ([place])   Normochromic normocytic anemia   Diarrhea   Dementia   Thrombocytopenia (HCC)   Enterococcal bacteremia   Gram-negative bacteremia   Moderate protein-calorie malnutrition (HCC)   Sebaceous cyst   Respiratory failure ([place])  VDRF - Status post extubation. Patient is now DNR/DNI and being followed by palliative care. Overall prognosis is very poor  HCAP vs aspiration pneumonia - Status post treatment with various antibiotics. Sputum culture had grown stenotrophomonas. Currently off antibiotics  Acute metabolic encephalopathy with underline progressive dementia -  Patient still almost unresponsive. Per daughter this is not patient's baseline -MRI brain pending,  -continue goals of care, palliative care input appreciated  6pm addendum:  MRI showed "Large, acute right MCA territory infarct without associated mass effect or intraparenchymal hematoma. Severe atrophy and  multifocal encephalomalacia" I have talked to neurology Dr Cristobal Goldmann over the phone who agree with palliative care I have talked to patient's daughter over the phone and updated her about the MRI findings. Palliative care Dr Geraldo Pitter also make aware the result.   Severe generalized deconditioning and protein energy malnutrition - Currently getting NG tube feeding. Doubt that that PEG tube placement and feeding will improve his quality of life with h/o dementia and GIST tumor   Acute renal failure: Cr improving  Other various comorbidities: , Enterococcal and Providencia bacteremia,  dementia, seizures, C. difficile colonization  - Off antibiotics. Supportive care.  Acute on chronic anemia:  no acute blood loss hgb 7.3, baseline around 10-11 in 2016, hgb13 on 5/17 could be from hemoconcentration prbc transfusion x1units   Urinary retention: foley in place  Case discussed with palliative care Dr Rowe Pavy and RN at bedside and daughter over the phone   DVT prophylaxis: SCDs Code Status:  DO NOT RESUSCITATE Family Communication: daughter over the phone Disposition Plan: Unknown at this time  Consultants: PCCM, palliative care, ID   Procedures: Intubation and extubation   Antimicrobials: Cefepime  6/5 >> 6/7 Ceftx 6/7 >> 6/10 Levaquin 6/5 >> 6/5 Vancomycin 6/5 >>6/16 Cefepime 6/6 >> 6/18 Metronidazole 6/6 >> 6/11  Bactrim 6/16 >> 6/23   Subjective: He is unresponsive, not moving extremities, not follow commands  on 2liter oxygen, his breathing is heavy,  left eye open (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)   Objective: Vitals:   05/03/17 0602 05/03/17 1430 05/03/17 2305 05/04/17 0531  BP:  115/60 135/75 (!) 158/97  Pulse: 91 76 74 98  Resp:  14 14 16   Temp:  98 F (36.7 C) 97.6 F (36.4 C) 98 F (36.7  C)  TempSrc:  Axillary Axillary Oral  SpO2: 100% 100% 100% 100%  Weight:      Height:        Intake/Output Summary (Last 24 hours) at  05/04/17 1119 Last data filed at 05/04/17 0700  Gross per 24 hour  Intake             1050 ml  Output             1350 ml  Net             -300 ml   Filed Weights   04/29/17 0415 04/30/17 0400 05/01/17 0501  Weight: 65.5 kg (144 lb 6.4 oz) 66.6 kg (146 lb 13.2 oz) 65.8 kg (145 lb 1 oz)    Examination:  General exam: Appears Chronically ill; unresponsive Respiratory system: Bilateral decreased breath sound at bases with scattered crackles Cardiovascular system: S1 & S2 heard, rate controlled.  Gastrointestinal system: Abdomen is nondistended, soft and nontender.  Extremities: Contracted, 1+ edema   Data Reviewed: I have personally reviewed following labs and imaging studies  CBC:  Recent Labs Lab 04/29/17 1141 04/30/17 0344  WBC 7.2 7.4  HGB 7.5* 7.3*  HCT 21.9* 20.9*  MCV 86.6 86.7  PLT 327 657   Basic Metabolic Panel:  Recent Labs Lab 04/28/17 0536 04/28/17 1406 04/29/17 0907 04/30/17 0344  NA  --   --  132* 133*  K  --   --  3.7 4.5  CL  --   --  100* 101  CO2  --   --  26 24  GLUCOSE  --   --  105* 100*  BUN  --   --  22* 23*  CREATININE 1.94*  --  1.81* 1.81*  CALCIUM  --   --  7.9* 7.9*  MG  --  2.1 2.1 2.1  PHOS  --  3.8 3.5 3.4   GFR: Estimated Creatinine Clearance: 26.9 mL/min (A) (by C-G formula based on SCr of 1.81 mg/dL (H)). Liver Function Tests: No results for input(s): AST, ALT, ALKPHOS, BILITOT, PROT, ALBUMIN in the last 168 hours. No results for input(s): LIPASE, AMYLASE in the last 168 hours. No results for input(s): AMMONIA in the last 168 hours. Coagulation Profile: No results for input(s): INR, PROTIME in the last 168 hours. Cardiac Enzymes: No results for input(s): CKTOTAL, CKMB, CKMBINDEX, TROPONINI in the last 168 hours. BNP (last 3 results) No results for input(s): PROBNP in the last 8760 hours. HbA1C: No results for input(s): HGBA1C in the last 72 hours. CBG:  Recent Labs Lab 04/30/17 2336 05/01/17 0401 05/01/17 0753  05/01/17 1207 05/01/17 1841  GLUCAP 109* 91 76 94 116*   Lipid Profile: No results for input(s): CHOL, HDL, LDLCALC, TRIG, CHOLHDL, LDLDIRECT in the last 72 hours. Thyroid Function Tests: No results for input(s): TSH, T4TOTAL, FREET4, T3FREE, THYROIDAB in the last 72 hours. Anemia Panel: No results for input(s): VITAMINB12, FOLATE, FERRITIN, TIBC, IRON, RETICCTPCT in the last 72 hours. Sepsis Labs: No results for input(s): PROCALCITON, LATICACIDVEN in the last 168 hours.  No results found for this or any previous visit (from the past 240 hour(s)).       Radiology Studies: No results found.      Scheduled Meds: . betaxolol  1 drop Right Eye Daily  . carBAMazepine  200 mg Per Tube QID  . dorzolamide  1 drop Right Eye Q12H  . finasteride  5 mg Oral Daily  . l-methylfolate-B6-B12  1 tablet Per  Tube Daily  . latanoprost  1 drop Right Eye QHS  . mouth rinse  15 mL Mouth Rinse BID  . ranitidine  150 mg Per Tube QHS  . scopolamine  1 patch Transdermal Q72H  . tamsulosin  0.4 mg Oral Daily  . thyroid  90 mg Per Tube Daily   Continuous Infusions: . feeding supplement (VITAL AF 1.2 CAL) 1,000 mL (05/03/17 2300)     LOS: 22 days     Time spent>57mins   Laquinn Shippy, MD PhD [facility] Pager [phone]  If 7PM-7AM, please contact night-coverage [email or web address] Password [email or web address] 05/04/2017, 11:19 AM

## 2017-05-04 NOTE — Progress Notes (Signed)
Daily Progress Note   Patient Name: Erik Massey       Date: 05/04/2017 DOB: 25-Oct-1929  Age: 83 y.o. MRN#: [id number] Attending Physician: Florencia Reasons, MD Primary Care Physician: Glendale Chard, MD Admit Date: 04/12/2017  Reason for Consultation/Follow-up: Establishing goals of care  Subjective:  unchanged since my last visit on 05-03-17  see below:  Length of Stay: 22  Current Medications: Scheduled Meds:  . betaxolol  1 drop Right Eye Daily  . carBAMazepine  200 mg Per Tube QID  . dorzolamide  1 drop Right Eye Q12H  . finasteride  5 mg Oral Daily  . l-methylfolate-B6-B12  1 tablet Per Tube Daily  . latanoprost  1 drop Right Eye QHS  . mouth rinse  15 mL Mouth Rinse BID  . ranitidine  150 mg Per Tube QHS  . scopolamine  1 patch Transdermal Q72H  . tamsulosin  0.4 mg Oral Daily  . thyroid  90 mg Per Tube Daily    Continuous Infusions: . feeding supplement (VITAL AF 1.2 CAL) 1,000 mL (05/03/17 2300)    PRN Meds: atropine, fentaNYL (SUBLIMAZE) injection, LORazepam, [DISCONTINUED] ondansetron **OR** ondansetron (ZOFRAN) IV  Physical Exam         Elderly gentleman Appears chronically ill Diminished breath sounds S1 S2 Abdomen soft Extremities with contractures, has edema  Vital Signs: BP (!) 158/97 (BP Location: Left Arm)   Pulse 98   Temp 98 F (36.7 C) (Oral)   Resp 16   Ht 5\' 6"  (1.676 m)   Wt 65.8 kg (145 lb 1 oz)   SpO2 100%   BMI 23.41 kg/m  SpO2: SpO2: 100 % O2 Device: O2 Device: Nasal Cannula O2 Flow Rate: O2 Flow Rate (L/min): 2 L/min  Intake/output summary:   Intake/Output Summary (Last 24 hours) at 05/04/17 1119 Last data filed at 05/04/17 0700  Gross per 24 hour  Intake             1050 ml  Output             1350 ml  Net             -300 ml     LBM: Last BM Date: 05/02/17 Baseline Weight: Weight: 59.6 kg (131 lb 6.3 oz) Most recent weight: Weight: 65.8 kg (145 lb 1 oz)  Palliative Assessment/Data:    Flowsheet Rows     Most Recent Value  Intake Tab  Referral Department  Critical care  Unit at Time of Referral  ICU  Palliative Care Primary Diagnosis  Other (Comment) [dementia, recurrent infections, VDRF ]  Date Notified  04/20/17  Palliative Care Type  Return patient Palliative Care  Reason for referral  Clarify Goals of Care  Date of Admission  04/12/17  Date first seen by Palliative Care  04/20/17  # of days IP prior to Palliative referral  8  Clinical Assessment  Palliative Performance Scale Score  10%  Pain Max last 24 hours  6  Pain Min Last 24 hours  4  Dyspnea Max Last 24 Hours  4  Dyspnea Min Last 24 hours  3  Anxiety Max Last 24 Hours  6  Anxiety Min Last 24 Hours  4  Psychosocial & Spiritual Assessment  Palliative Care Outcomes  Patient/Family meeting held?  Yes  Who was at the meeting?  discussed with patient's daughter over the phone.   Palliative Care Outcomes  Clarified goals of care      Patient Active Problem List   Diagnosis Date Noted  . HCAP (healthcare-associated pneumonia) 04/18/2017  . Respiratory failure ([place])   . Sebaceous cyst   . Dementia 04/14/2017  . Thrombocytopenia ([place]) 04/14/2017  . Enterococcal bacteremia 04/14/2017  . Gram-negative bacteremia 04/14/2017  . Moderate protein-calorie malnutrition ([place]) 04/14/2017  . Sepsis ([place]) 04/12/2017  . Normochromic normocytic anemia 04/12/2017  . Diarrhea 04/12/2017  . ARF (acute renal failure) ([place]) 03/24/2017  . Seizure ([place]) 10/20/2015  . UTI (urinary tract infection) 10/20/2015  . Physical deconditioning   . BPH (benign prostatic hyperplasia)     Palliative Care Assessment & Plan   Patient Profile:    Assessment: 83 year male with history of dementia, hypothyroidism, seizure disorder, recent diagnosis of GIST  being managed conservatively was admitted on 04/12/2017 for septic shock and HCAP. He has had complicated and prolonged hospitalization with VDRF, enterococcal and Providencia bacteremia, C. difficile colonization and tracheal culture growing stenotrophomonas treated with various broad-spectrum antibiotics. He was extubated and palliative care team has been following the patient. He was transferred out of ICU care on 05/02/2017. Hospitalist service is resuming care on 05/03/2017. He remains unresponsive with NG tube feeding. Palliative medicine team has been following.    Recommendations/Plan:   Ongoing conversations with daughter Dr Heath Gold regarding the patient's overall condition and her goals and wishes for him. Discussed with Dr Erlinda Hong about daughter's requests for additional medical workup prior to her decision for PEG. Discussed with Dr Erlinda Hong about whether or not a PEG might even be appropriate to be placed, given history of GIST.   We are continuing gentle conversations with daughter, to help guide decision making and facilitating disposition arrangements.      Code Status:    Code Status Orders        Start     Ordered   04/30/17 1217  Do not attempt resuscitation (DNR)  Continuous    Question Answer Comment  In the event of cardiac or respiratory ARREST Do not call a "code blue"   In the event of cardiac or respiratory ARREST Do not perform Intubation, CPR, defibrillation or ACLS   In the event of cardiac or respiratory ARREST Use medication by any route, position, wound care, and other measures to relive pain and suffering. May use oxygen, suction and manual treatment of airway obstruction as needed  for comfort.      04/30/17 1217    Code Status History    Date Active Date Inactive Code Status Order ID Comments User Context   04/17/2017  1:49 PM 04/30/2017 12:17 PM Partial Code [id number]  [name], MD Inpatient   04/17/2017 12:17 AM 04/17/2017  1:49 PM Partial Code [id number]   Luz Brazen, MD Inpatient   04/16/2017 11:37 PM 04/17/2017 12:17 AM Full Code [id number]  Vianne Bulls, MD Inpatient   04/13/2017  1:22 AM 04/16/2017 11:37 PM DNR [id number]  Rise Patience, MD Inpatient   04/12/2017 11:12 PM 04/13/2017  1:19 AM DNR [id number]  Rise Patience, MD ED   04/12/2017  5:23 PM 04/12/2017 11:12 PM DNR [id number]  Fredia Sorrow, MD ED   03/24/2017 11:01 PM 03/28/2017  9:06 PM DNR [id number]  Rise Patience, MD ED   10/21/2015  6:42 PM 10/24/2015  5:41 PM DNR [id number]  Micheline Rough, MD Inpatient   10/20/2015  9:00 AM 10/21/2015  6:42 PM Full Code [id number]  Willia Craze, NP ED   10/20/2015  8:27 AM 10/20/2015  8:27 AM Full Code [id number]  Willia Craze, NP ED    Advance Directive Documentation     Most Recent Value  Type of Advance Directive  Out of facility DNR (pink MOST or yellow form)  Pre-existing out of facility DNR order (yellow form or pink MOST form)  Yellow form placed in chart (order not valid for inpatient use)  "MOST" Form in Place?  -       Prognosis:   guarded  Discharge Planning:  To Be Determined  Care plan was discussed with  Daughter, Dr Erlinda Hong  Thank you for allowing the Palliative Medicine Team to assist in the care of this patient.   Time In: 9 Time Out: 9.15 Total Time 15 Prolonged Time Billed  no       Greater than 50%  of this time was spent counseling and coordinating care related to the above assessment and plan.  Loistine Chance, MD [phone]  Please contact Palliative Medicine Team phone at [phone] for questions and concerns.

## 2017-05-05 LAB — CARBAMAZEPINE, FREE AND TOTAL
Carbamazepine, Free: 1.5 ug/mL (ref 0.6–4.2)
Carbamazepine, Total: 4.3 ug/mL (ref 4.0–12.0)

## 2017-05-05 LAB — TYPE AND SCREEN
ABO/RH(D): B POS
Antibody Screen: NEGATIVE
Unit division: 0

## 2017-05-05 LAB — BPAM RBC
BLOOD PRODUCT EXPIRATION DATE: 201807162359
ISSUE DATE / TIME: 201806271746
Unit Type and Rh: 7300

## 2017-05-05 LAB — T3, FREE: T3, Free: 1.5 pg/mL — ABNORMAL LOW (ref 2.0–4.4)

## 2017-05-05 LAB — VITAMIN D 25 HYDROXY (VIT D DEFICIENCY, FRACTURES): VIT D 25 HYDROXY: 47.2 ng/mL (ref 30.0–100.0)

## 2017-05-05 NOTE — Progress Notes (Signed)
CSW spoke with [name] and residential hospice home choice was provided. [name] has chosen [facility]. CSW has contacted hospice home and referral provided. CSW will update family / nsg / Attending once a placement decision has been made.  Werner Lean LCSW [phone]

## 2017-05-05 NOTE — Progress Notes (Addendum)
CSW assisting with d/c planning. CSW received consult for residential hospice home placement. VM left for [name] to contact CSW. Hospice Home choice will be provided once [name] returns CSW call. CSW has contacted [facility] to check availability. Awaiting return call.  Werner Lean LCSW [phone]  11:42 [facility] has no openings today. CSW will provide additional residential hospice home options to [name] when she returnd CSW call.  Werner Lean LCSW [phone]

## 2017-05-05 NOTE — Progress Notes (Signed)
Patient ID: Erik Massey, male   DOB: [birth year], 83 y.o.   MRN: [id number]  PROGRESS NOTE    GARNETT NUNZIATA  WHQ:[id number] DOB: Feb 19, 1929 DOA: 04/12/2017 PCP: Glendale Chard, MD   Brief Narrative:  83 year male with history of dementia, hypothyroidism, seizure disorder, recent diagnosis of GIST being managed conservatively was admitted on 04/12/2017 for septic shock and HCAP. He has had complicated and prolonged hospitalization with VDRF, enterococcal and Providencia bacteremia, C. difficile colonization and tracheal culture growing stenotrophomonas treated with various broad-spectrum antibiotics. He was extubated and palliative care team has been following the patient. He was transferred out of ICU care on 05/02/2017. Hospitalist service is resuming care on 05/03/2017. He remains unresponsive with NG tube feeding  Assessment & Plan:   Principal Problem:   HCAP (healthcare-associated pneumonia) Active Problems:   Seizure ([place])   UTI (urinary tract infection)   ARF (acute renal failure) (HCC)   Sepsis ([place])   Normochromic normocytic anemia   Diarrhea   Dementia   Thrombocytopenia (HCC)   Enterococcal bacteremia   Gram-negative bacteremia   Moderate protein-calorie malnutrition (HCC)   Sebaceous cyst   Respiratory failure ([place])  VDRF - Status post extubation. Patient is now DNR/DNI and being followed by palliative care. Overall prognosis is very poor  HCAP vs aspiration pneumonia - Status post treatment with various antibiotics. Sputum culture had grown stenotrophomonas. Currently off antibiotics  Acute metabolic encephalopathy with underline progressive dementia -  Patient still almost unresponsive. Per daughter this is not patient's baseline -MRI showed "Large, acute right MCA territory infarct without associated mass effect or intraparenchymal hematoma. Severe atrophy and multifocal encephalomalacia" I have talked to neurology Dr Cristobal Goldmann over the phone who agree with  palliative care -palliative care input appreciated, patient to be discharged to residential hospice once bed available  Severe generalized deconditioning and protein energy malnutrition - Currently getting NG tube feeding. Doubt that that PEG tube placement and feeding will improve his quality of life with h/o dementia and GIST tumor   Acute renal failure: Cr improving  Other various comorbidities: , Enterococcal and Providencia bacteremia,  dementia, seizures, C. difficile colonization  - Off antibiotics. Supportive care.  Acute on chronic anemia:  no acute blood loss hgb 7.3, baseline around 10-11 in 2016, hgb13 on 5/17 could be from hemoconcentration prbc transfusion x1units   Urinary retention: foley in place  Case discussed with palliative care Dr Rowe Pavy and RN at bedside and daughter over the phone   DVT prophylaxis: SCDs Code Status:  DO NOT RESUSCITATE Family Communication: daughter over the phone Disposition Plan: Unknown at this time  Consultants: PCCM, palliative care, ID   Procedures: Intubation and extubation   Antimicrobials: Cefepime  6/5 >> 6/7 Ceftx 6/7 >> 6/10 Levaquin 6/5 >> 6/5 Vancomycin 6/5 >>6/16 Cefepime 6/6 >> 6/18 Metronidazole 6/6 >> 6/11  Bactrim 6/16 >> 6/23   Subjective: He is unresponsive, not moving extremities, not follow commands  on 2liter oxygen, his breathing is heavy,  left eye open (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)   Objective: Vitals:   05/05/17 0133 05/05/17 0200 05/05/17 0312 05/05/17 0505  BP:    134/72  Pulse:    83  Resp:    20  Temp:    98.9 F (37.2 C)  TempSrc:    Oral  SpO2: 95% 96% 95% 96%  Weight:      Height:        Intake/Output Summary (Last 24 hours) at 05/05/17  [place] filed at 05/05/17 4128  Gross per 24 hour  Intake          1718.33 ml  Output             1470 ml  Net           248.33 ml   Filed Weights   04/29/17 0415 04/30/17 0400 05/01/17 0501  Weight:  65.5 kg (144 lb 6.4 oz) 66.6 kg (146 lb 13.2 oz) 65.8 kg (145 lb 1 oz)    Examination:  General exam: Appears Chronically ill; unresponsive Respiratory system: Bilateral decreased breath sound at bases with scattered crackles Cardiovascular system: S1 & S2 heard, rate controlled.  Gastrointestinal system: Abdomen is nondistended, soft and nontender.  Extremities: Contracted, 1+ edema   Data Reviewed: I have personally reviewed following labs and imaging studies  CBC:  Recent Labs Lab 04/29/17 1141 04/30/17 0344 05/04/17 1400  WBC 7.2 7.4 6.2  HGB 7.5* 7.3* 7.0*  HCT 21.9* 20.9* 20.8*  MCV 86.6 86.7 87.0  PLT 327 366 786   Basic Metabolic Panel:  Recent Labs Lab 04/28/17 1406 04/29/17 0907 04/30/17 0344 05/04/17 1204  NA  --  132* 133* 139  K  --  3.7 4.5 4.1  CL  --  100* 101 104  CO2  --  26 24 27   GLUCOSE  --  105* 100* 101*  BUN  --  22* 23* 27*  CREATININE  --  1.81* 1.81* 1.03  CALCIUM  --  7.9* 7.9* 8.5*  MG 2.1 2.1 2.1  --   PHOS 3.8 3.5 3.4  --    GFR: Estimated Creatinine Clearance: 47.3 mL/min (by C-G formula based on SCr of 1.03 mg/dL). Liver Function Tests: No results for input(s): AST, ALT, ALKPHOS, BILITOT, PROT, ALBUMIN in the last 168 hours. No results for input(s): LIPASE, AMYLASE in the last 168 hours. No results for input(s): AMMONIA in the last 168 hours. Coagulation Profile: No results for input(s): INR, PROTIME in the last 168 hours. Cardiac Enzymes: No results for input(s): CKTOTAL, CKMB, CKMBINDEX, TROPONINI in the last 168 hours. BNP (last 3 results) No results for input(s): PROBNP in the last 8760 hours. HbA1C: No results for input(s): HGBA1C in the last 72 hours. CBG:  Recent Labs Lab 04/30/17 2336 05/01/17 0401 05/01/17 0753 05/01/17 1207 05/01/17 1841  GLUCAP 109* 91 76 94 116*   Lipid Profile: No results for input(s): CHOL, HDL, LDLCALC, TRIG, CHOLHDL, LDLDIRECT in the last 72 hours. Thyroid Function  Tests:  Recent Labs  05/04/17 1204  TSH 16.790*  FREET4 0.50*  T3FREE 1.5*   Anemia Panel:  Recent Labs  05/04/17 1204  VITAMINB12 2,363*   Sepsis Labs: No results for input(s): PROCALCITON, LATICACIDVEN in the last 168 hours.  No results found for this or any previous visit (from the past 240 hour(s)).       Radiology Studies: Mr Brain Wo Contrast  Result Date: 05/04/2017 CLINICAL DATA:  Dementia and encephalopathy. EXAM: MRI HEAD WITHOUT CONTRAST TECHNIQUE: Multiplanar, multiecho pulse sequences of the brain and surrounding structures were obtained without intravenous contrast. COMPARISON:  Head CT 09/26/2015 FINDINGS: Brain: There is a large amount of diffusion restriction throughout the right hemisphere, within the MCA territory. There is hyperintense T2 weighted signal throughout the right MCA distribution and much of the left hemispheric white matter. There is a large area of encephalomalacia within the left parietal lobe. There is diffuse volume loss and ex vacuo dilatation of ventricles. Bifrontal  encephalomalacia is also present. No intraparenchymal hematoma. No mass effect or midline shift. The dura is normal and there is no extra-axial collection. Vascular: Diminished appearance of the right MCA flow void. Skull and upper cervical spine: Remote bifrontal craniotomy. Sinuses/Orbits: No fluid levels or advanced mucosal thickening. No mastoid effusion. Left-sided enucleation. IMPRESSION: 1. Large, acute right MCA territory infarct without associated mass effect or intraparenchymal hematoma. 2. Severe atrophy and multifocal encephalomalacia. These results will be called to the ordering clinician or representative by the Radiologist Assistant, and communication documented in the PACS or zVision Dashboard. Electronically Signed   By: Ulyses Jarred M.D.   On: 05/04/2017 17:34        Scheduled Meds: . betaxolol  1 drop Right Eye Daily  . carBAMazepine  200 mg Per Tube QID  .  dorzolamide  1 drop Right Eye Q12H  . finasteride  5 mg Oral Daily  . l-methylfolate-B6-B12  1 tablet Per Tube Daily  . latanoprost  1 drop Right Eye QHS  . mouth rinse  15 mL Mouth Rinse BID  . ranitidine  150 mg Per Tube QHS  . scopolamine  1 patch Transdermal Q72H  . tamsulosin  0.4 mg Oral Daily  . thyroid  90 mg Per Tube Daily   Continuous Infusions: . sodium chloride    . feeding supplement (VITAL AF 1.2 CAL) 1,000 mL (05/04/17 1800)     LOS: 23 days     Time spent> 64mins   Ixel Boehning, MD PhD [facility] Pager [phone]  If 7PM-7AM, please contact night-coverage [email or web address] Password [email or web address] 05/05/2017, 11:59 AM

## 2017-05-05 NOTE — Consult Note (Signed)
[facility]: Spoke to pt's daughter [name]. She is familiar with hospice type of care. Her father has been living with her since moving here from [place]. She was explained the hospice service plan and agrees to focus on comfort care for the pt. She verifies that he is a DNR and that he would have the feeding tube removed. She understands we will not do IVF or IV antibiotics just focus on his symptoms. She is appreciative of the call but unfortunately she can not meet today to sign consents for pt to transfer. Offered her a bed and she stated she can meet tomorrow at 200pm. Webb Silversmith

## 2017-05-05 NOTE — Progress Notes (Signed)
Late Entry: An order was placed for one unit of PRBC's to be given to patient. The pre-blood vital signs were obtained at 1749. Blood was issued at 1746 from lab.  Blood was verified by two RN's. Blood was hung and official start time was 1805 at a rate of 120 mls per hour.  After 15 minutes of infusing, vitals were retaken at 1820. At this time, while completing my charting in the computer, I was prompted to scan the bag which I did and had the other RN sign off. No reaction was noted from the patient and the blood finished on time at 1005.

## 2017-05-05 NOTE — Consult Note (Signed)
   [facility] Inpatient Consult   05/05/2017  [name] 1929/09/24 [id number]    Patient screened for potential [facility] Care Management services. Chart reviewed. Noted current discharge plan is for residential hospice.  There are no identifiable [facility] Care Management needs at this time.    Marthenia Rolling, MSN-Ed, RN,BSN [facility] Liaison [phone]

## 2017-05-05 NOTE — Progress Notes (Signed)
Received request from [place], [place], for family interest in [facility].  Chart reviewed.  I have not talked with family to acknowledge.  Unfortunately, [facility] is not able to offer a room today.  Roselyn Reef, CSW, is aware.  Roselyn Reef, CSW, will follow up with [facility] tomorrow, if they would like to make this a formal referral.  At this time, they will consider other options.   Thank you for considering [facility],  Edyth Gunnels, RN, [facility] Liaison [phone]  All hospital liaisons are now on [place].

## 2017-05-05 NOTE — Care Management Note (Signed)
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 CSW following.   Expected Discharge Date:  05/05/2016               Expected Discharge Plan:  Skilled Nursing Facility  In-House Referral:  Clinical Social Work  Discharge planning Services  CM Consult  Post Acute Care Choice:    Choice offered to:     DME Arranged:    DME Agency:     HH Arranged:    [place] Agency:     Status of Service:  In process, will continue to follow  If discussed at Long Length of Stay Meetings, dates discussed:    Additional CommentsPurcell Mouton, RN 05/05/2017, 2:18 PM

## 2017-05-05 NOTE — Progress Notes (Addendum)
Daily Progress Note   Patient Name: Erik Massey       Date: 05/05/2017 DOB: 05-18-1929  Age: 83 y.o. MRN#: [id number] Attending Physician: Florencia Reasons, MD Primary Care Physician: Glendale Chard, MD Admit Date: 04/12/2017  Reason for Consultation/Follow-up: Establishing goals of care  Subjective:  unchanged    see below:  Length of Stay: 23  Current Medications: Scheduled Meds:  . betaxolol  1 drop Right Eye Daily  . carBAMazepine  200 mg Per Tube QID  . dorzolamide  1 drop Right Eye Q12H  . finasteride  5 mg Oral Daily  . l-methylfolate-B6-B12  1 tablet Per Tube Daily  . latanoprost  1 drop Right Eye QHS  . mouth rinse  15 mL Mouth Rinse BID  . ranitidine  150 mg Per Tube QHS  . scopolamine  1 patch Transdermal Q72H  . tamsulosin  0.4 mg Oral Daily  . thyroid  90 mg Per Tube Daily    Continuous Infusions: . sodium chloride    . feeding supplement (VITAL AF 1.2 CAL) 1,000 mL (05/04/17 1800)    PRN Meds: atropine, fentaNYL (SUBLIMAZE) injection, LORazepam, [DISCONTINUED] ondansetron **OR** ondansetron (ZOFRAN) IV  Physical Exam         Elderly gentleman Appears chronically ill Diminished breath sounds S1 S2 Abdomen soft Extremities with contractures, has edema  Vital Signs: BP 134/72 (BP Location: Left Arm)   Pulse 83   Temp 98.9 F (37.2 C) (Oral)   Resp 20   Ht 5\' 6"  (1.676 m)   Wt 65.8 kg (145 lb 1 oz)   SpO2 96%   BMI 23.41 kg/m  SpO2: SpO2: 96 % O2 Device: O2 Device: Not Delivered O2 Flow Rate: O2 Flow Rate (L/min): 2 L/min  Intake/output summary:   Intake/Output Summary (Last 24 hours) at 05/05/17 1020 Last data filed at 05/05/17 0622  Gross per 24 hour  Intake          1718.33 ml  Output             1970 ml  Net          -251.67 ml   LBM:  Last BM Date: 05/03/17 Baseline Weight: Weight: 59.6 kg (131 lb 6.3 oz) Most recent weight: Weight: 65.8 kg (145 lb 1 oz)  Palliative Assessment/Data:    Flowsheet Rows     Most Recent Value  Intake Tab  Referral Department  Critical care  Unit at Time of Referral  ICU  Palliative Care Primary Diagnosis  Other (Comment) [dementia, recurrent infections, VDRF ]  Date Notified  04/20/17  Palliative Care Type  Return patient Palliative Care  Reason for referral  Clarify Goals of Care  Date of Admission  04/12/17  Date first seen by Palliative Care  04/20/17  # of days IP prior to Palliative referral  8  Clinical Assessment  Palliative Performance Scale Score  10%  Pain Max last 24 hours  6  Pain Min Last 24 hours  4  Dyspnea Max Last 24 Hours  4  Dyspnea Min Last 24 hours  3  Anxiety Max Last 24 Hours  6  Anxiety Min Last 24 Hours  4  Psychosocial & Spiritual Assessment  Palliative Care Outcomes  Patient/Family meeting held?  Yes  Who was at the meeting?  discussed with patient's daughter over the phone.   Palliative Care Outcomes  Clarified goals of care      Patient Active Problem List   Diagnosis Date Noted  . HCAP (healthcare-associated pneumonia) 04/18/2017  . Respiratory failure ([place])   . Sebaceous cyst   . Dementia 04/14/2017  . Thrombocytopenia ([place]) 04/14/2017  . Enterococcal bacteremia 04/14/2017  . Gram-negative bacteremia 04/14/2017  . Moderate protein-calorie malnutrition ([place]) 04/14/2017  . Sepsis ([place]) 04/12/2017  . Normochromic normocytic anemia 04/12/2017  . Diarrhea 04/12/2017  . ARF (acute renal failure) ([place]) 03/24/2017  . Seizure ([place]) 10/20/2015  . UTI (urinary tract infection) 10/20/2015  . Physical deconditioning   . BPH (benign prostatic hyperplasia)     Palliative Care Assessment & Plan   Patient Profile:    Assessment: 84 year male with history of dementia, hypothyroidism, seizure disorder, recent diagnosis of GIST being  managed conservatively was admitted on 04/12/2017 for septic shock and HCAP. He has had complicated and prolonged hospitalization with VDRF, enterococcal and [place]id[place] bacteremia, C. difficile colonization and tracheal culture growing stenotrophomonas treated with various broad-spectrum antibiotics. He was extubated and palliative care team has been following the patient. He was transferred out of ICU care on 05/02/2017. Hospitalist service is resuming care on 05/03/2017. He remains unresponsive with NG tube feeding. Palliative medicine team has been following.    Recommendations/Plan:   Discussed with daughter Dr Heath Gold:  Daughter states that she has discussed with her mom, family is now in agreement for no PEG, residential hospice Continue current mode of care until there is a hospice bed available, daughter does not want tube feeds d/c until the patient goes to hospice, she is aware that tube feeds will not be continued at hospice Discussed in detail with Maudie Mercury about hospice level of care, all questions answered to the best of my ability   Code Status:    Code Status Orders        Start     Ordered   04/30/17 1217  Do not attempt resuscitation (DNR)  Continuous    Question Answer Comment  In the event of cardiac or respiratory ARREST Do not call a "code blue"   In the event of cardiac or respiratory ARREST Do not perform Intubation, CPR, defibrillation or ACLS   In the event of cardiac or respiratory ARREST Use medication by any route, position, wound care, and other measures to relive pain and suffering. May use oxygen, suction and manual treatment of  airway obstruction as needed for comfort.      04/30/17 1217    Code Status History    Date Active Date Inactive Code Status Order ID Comments User Context   04/17/2017  1:49 PM 04/30/2017 12:17 PM Partial Code [id number]  [name], MD Inpatient   04/17/2017 12:17 AM 04/17/2017  1:49 PM Partial Code [id number]  Luz Brazen, MD  Inpatient   04/16/2017 11:37 PM 04/17/2017 12:17 AM Full Code [id number]  Vianne Bulls, MD Inpatient   04/13/2017  1:22 AM 04/16/2017 11:37 PM DNR [id number]  Rise Patience, MD Inpatient   04/12/2017 11:12 PM 04/13/2017  1:19 AM DNR [id number]  Rise Patience, MD ED   04/12/2017  5:23 PM 04/12/2017 11:12 PM DNR [id number]  Fredia Sorrow, MD ED   03/24/2017 11:01 PM 03/28/2017  9:06 PM DNR [id number]  Rise Patience, MD ED   10/21/2015  6:42 PM 10/24/2015  5:41 PM DNR [id number]  Micheline Rough, MD Inpatient   10/20/2015  9:00 AM 10/21/2015  6:42 PM Full Code [id number]  Willia Craze, NP ED   10/20/2015  8:27 AM 10/20/2015  8:27 AM Full Code [id number]  Willia Craze, NP ED    Advance Directive Documentation     Most Recent Value  Type of Advance Directive  Out of facility DNR (pink MOST or yellow form)  Pre-existing out of facility DNR order (yellow form or pink MOST form)  Yellow form placed in chart (order not valid for inpatient use)  "MOST" Form in Place?  -       Prognosis:   less than 2 weeks.   Discharge Planning:  Daughter is in agreement for residential hospice now.   Care plan was discussed with  Daughter, Dr Erlinda Hong and York Cerise from social work.   Thank you for allowing the Palliative Medicine Team to assist in the care of this patient.   Time In: 9.30 Time Out: 10.05 Total Time 35 Prolonged Time Billed  no       Greater than 50%  of this time was spent counseling and coordinating care related to the above assessment and plan.  Loistine Chance, MD [phone]  Please contact Palliative Medicine Team phone at [phone] for questions and concerns.

## 2017-05-06 MED ORDER — CARBAMAZEPINE 100 MG/5ML PO SUSP: 200.0000 mg | Freq: Four times a day (QID) | ORAL | 12 refills | Status: AC

## 2017-05-06 MED ORDER — ATROPINE SULFATE 1 % OP SOLN: 4.0000 [drp] | OPHTHALMIC | 12 refills | Status: AC | PRN

## 2017-05-06 NOTE — Progress Notes (Signed)
Nutrition Brief Note  Chart reviewed. Pt now transitioning to comfort care and scheduled to discharge to residential hospice today.   No further nutrition interventions warranted at this time.  Please re-consult as needed.   Koleen Distance MS, RD, LDN Pager #- [phone] After Hours Pager: [phone]

## 2017-05-06 NOTE — Discharge Summary (Signed)
Discharge Summary  [name] XLK:[id number] DOB: 10/14/1929  PCP: Glendale Chard, MD  Admit date: 04/12/2017 Discharge date: 05/06/2017  Time spent: >10mins, more than 50% time spent on coordination of care.   Discharge Diagnoses:  Active Hospital Problems   Diagnosis Date Noted  . HCAP (healthcare-associated pneumonia) 04/18/2017  . Respiratory failure ([place])   . Sebaceous cyst   . Dementia 04/14/2017  . Thrombocytopenia ([place]) 04/14/2017  . Enterococcal bacteremia 04/14/2017  . Gram-negative bacteremia 04/14/2017  . Moderate protein-calorie malnutrition ([place]) 04/14/2017  . Sepsis ([place]) 04/12/2017  . Normochromic normocytic anemia 04/12/2017  . Diarrhea 04/12/2017  . ARF (acute renal failure) ([place]) 03/24/2017  . Seizure ([place]) 10/20/2015  . UTI (urinary tract infection) 10/20/2015    Resolved Hospital Problems   Diagnosis Date Noted Date Resolved  No resolved problems to display.    Discharge Condition: stable    Filed Weights   04/29/17 0415 04/30/17 0400 05/01/17 0501  Weight: 65.5 kg (144 lb 6.4 oz) 66.6 kg (146 lb 13.2 oz) 65.8 kg (145 lb 1 oz)    History of present illness:  PCP: Glendale Chard, MD  Patient coming from: [facility].  Chief Complaint: Increasing confusion, hypotension and hematuria.  HPI: Erik Massey is a 83 y.o. 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 As per the patient's daughter who provided the history patient was doing fine until 2 days ago when patient became increasingly lethargic weak and more bed bound. Patient was brought to the occupational  getting hypotensive and lethargic and increasing hematuria.   ED Course: In the ER patient was found to be hypotensive with Foley catheter draining hematuria. Patient also had a large loose bowel movement in the ER. Chest x-ray shows possible pneumonic process. Blood cultures urine cultures were obtained and patient was started empirically antibiotics for sepsis and fluid started.  Hospital Course:  Principal Problem:   HCAP (healthcare-associated pneumonia) Active Problems:   Seizure ([place])   UTI (urinary tract infection)   ARF (acute renal failure) ([facility])   Sepsis ([facility])   Normochromic normocytic anemia   Diarrhea   Dementia   Thrombocytopenia ([facility])   Enterococcal bacteremia   Gram-negative bacteremia   Moderate protein-calorie malnutrition ([facility])   Sebaceous cyst   Respiratory failure ([facility])   VDRF - Status post extubation. Patient is now DNR/DNI and being followed by palliative care. Overall prognosis is very poor  HCAP vs aspiration pneumonia - Status post treatment with various antibiotics. Sputum culture had grown stenotrophomonas. Currently off antibiotics  Acute metabolic encephalopathy with underline progressive dementia -  Patient still almost unresponsive. Per daughter this is not patient's baseline -MRI showed "Large, acute right MCA territory infarct without associated mass effect or intraparenchymal hematoma. Severe atrophy and multifocal encephalomalacia" I have talked to neurology Dr Cristobal Goldmann over the phone who agree with palliative care -palliative care input appreciated, patient to be discharged to residential hospice once bed available  Severe generalized deconditioning and protein energy malnutrition - Currently getting NG tube feeding. Doubt that that PEG tube placement and feeding will improve his quality of life with h/o dementia and GIST tumor  -residential hospice placement  Acute renal  failure: Cr improving  Other various comorbidities: ,  Enterococcal and [place] bacteremia,  dementia, seizures, C. difficile colonization  - Off antibiotics. Supportive care.  Acute on chronic anemia:  no acute blood loss hgb 7.3, baseline around 10-11 in 2016, hgb13 on 5/17 could be from hemoconcentration prbc transfusion x1units   Urinary retention: foley in place      Code Status:  DO NOT RESUSCITATE Family Communication: daughter over the phone Disposition Plan: residential hospice  Consultants: PCCM, palliative care, ID    Discharge Exam: BP (!) 155/78 (BP Location: Left Arm)   Pulse 87   Temp 99.1 F (37.3 C) (Axillary)   Resp 18   Ht 5\' 6"  (1.676 m)   Wt 65.8 kg (145 lb 1 oz)   SpO2 97%   BMI 23.41 kg/m   General: Chronically ill; unresponsive Cardiovascular: rrr Respiratory: diminished    Allergies as of 05/06/2017      Reactions   Penicillins Swelling   Has patient had a PCN reaction causing immediate rash, facial/tongue/throat swelling, SOB or lightheadedness with hypotension: yes Has patient had a PCN reaction causing severe rash involving mucus membranes or skin necrosis: no Has patient had a PCN reaction that required hospitalization- yes already in the hospital Has patient had a PCN reaction occurring within the last 10 years: No If all of the above answers are "NO", then may proceed with Cephalosporin use.      Medication List    STOP taking these medications   ARMOUR THYROID 90 MG tablet Generic drug:  thyroid   carbamazepine 400 MG 12 hr tablet Commonly known as:  TEGRETOL XR Replaced by:  carBAMazepine 100 MG/5ML suspension   ciprofloxacin 500 MG tablet Commonly known as:  CIPRO   finasteride 5 MG tablet Commonly known as:  PROSCAR   magnesium oxide 400 (241.3 Mg) MG tablet Commonly known as:  MAG-OX   metroNIDAZOLE 500 MG tablet Commonly known as:  FLAGYL   polyethylene glycol packet Commonly known as:  MIRALAX / GLYCOLAX   senna-docusate 8.6-50 MG tablet Commonly  known as:  Senokot-S   tamsulosin 0.4 MG Caps capsule Commonly known as:  FLOMAX     TAKE these medications   atropine 1 % ophthalmic solution Place 4 drops under the tongue as needed (Respiratory secretions).   BETOPTIC-S 0.25 % ophthalmic suspension Generic drug:  betaxolol Place 1 drop into the right eye daily.   carBAMazepine 100 MG/5ML suspension Commonly known as:  TEGRETOL Place 10 mLs (200 mg total) into feeding tube 4 (four) times daily. Replaces:  carbamazepine 400 MG 12 hr tablet   dorzolamide 2 % ophthalmic solution Commonly known as:  TRUSOPT Place 1 drop into the right eye every 12 (twelve) hours.   latanoprost 0.005 % ophthalmic solution Commonly known as:  XALATAN Place 1 drop into the right eye at bedtime.      Allergies  Allergen Reactions  . Penicillins Swelling    Has patient had a PCN reaction causing immediate rash, facial/tongue/throat swelling, SOB or lightheadedness with hypotension: yes Has patient had a PCN reaction causing severe rash involving mucus membranes or skin necrosis: no Has patient had a PCN reaction that required hospitalization- yes already in the hospital Has patient had a PCN reaction occurring within the last 10 years: No If all of the above answers are "NO", then may proceed with Cephalosporin use.       The results of significant diagnostics from this hospitalization (including imaging, microbiology, ancillary and laboratory)  are listed below for reference.    Significant Diagnostic Studies: Dg Abd 1 View  Result Date: 04/28/2017 CLINICAL DATA:  Orogastric tube placement EXAM: ABDOMEN - 1 VIEW COMPARISON:  April 26, 2017 FINDINGS: Orogastric tube tip and side port are in the stomach. Bowel gas pattern unremarkable. Visualized lung bases clear. No free air. IMPRESSION: Orogastric tube tip and side port in stomach. Visualized bowel gas pattern unremarkable. Electronically Signed   By: Lowella Grip III M.D.   On: 04/28/2017  12:53   Dg Abd 1 View  Result Date: 04/26/2017 CLINICAL DATA:  Check nasogastric catheter placement EXAM: ABDOMEN - 1 VIEW COMPARISON:  04/24/2017 FINDINGS: Scattered large and small bowel gas is noted. Nasogastric catheter is coiled within the stomach. No free air is seen. No bony abnormality is noted. IMPRESSION: Nasogastric catheter coiled within the stomach. These results were called by telephone at the time of interpretation on 04/26/2017 at 10:36 am to [facility], the patients nurse, who verbally acknowledged these results. Electronically Signed   By: [name] M.D.   On: 04/26/2017 10:36   Dg Abd 1 View  Result Date: 04/24/2017 CLINICAL DATA:  NG tube placement.  Acute respiratory failure. EXAM: ABDOMEN - 1 VIEW COMPARISON:  Abdominal radiograph 04/20/2017 FINDINGS: Tip and side-port below the diaphragm in the stomach. Nonobstructive bowel gas pattern in the upper abdomen. Endotracheal tube better assessed on concurrent chest radiograph. IMPRESSION: Tip and side port of the enteric tube below the diaphragm in the stomach. Electronically Signed   By: Jeb Levering M.D.   On: 04/24/2017 22:38   Dg Abd 1 View  Result Date: 04/20/2017 CLINICAL DATA:  OG tube placement.  Endotracheal tube placement. EXAM: ABDOMEN - 1 VIEW COMPARISON:  04/17/2017 FINDINGS: Portable supine view of the lower chest and upper abdomen obtained at 0805 hours shows endotracheal tube tip approximately 3.3 cm above the base of the carina. Right base collapse/ consolidation again noted with associated cardiomegaly. Probable small bilateral pleural effusions. Upper abdomen reveals OG tube looped in the stomach with the tip in the pyloric region. Bowel gas in the upper abdomen has a nonspecific pattern. IMPRESSION: 1. Endotracheal tube tip approximately 3.3 cm above the base of the carina. 2. OG tube is looped in the stomach with the tip in the pyloric region. Electronically Signed   By: Misty Stanley M.D.   On: 04/20/2017 08:39    Dg Abd 1 View  Result Date: 04/17/2017 CLINICAL DATA:  NG tube placement EXAM: ABDOMEN - 1 VIEW COMPARISON:  Chest radiograph 04/16/2017 FINDINGS: The tip and side port of the nasogastric tube overlies the gastric body. Left lung base aeration is improved. There are persistent opacities in the right lung base. IMPRESSION: NG tube tip and side port overlying the gastric body. Electronically Signed   By: Ulyses Jarred M.D.   On: 04/17/2017 16:47   Mr Brain Wo Contrast  Result Date: 05/04/2017 CLINICAL DATA:  Dementia and encephalopathy. EXAM: MRI HEAD WITHOUT CONTRAST TECHNIQUE: Multiplanar, multiecho pulse sequences of the brain and surrounding structures were obtained without intravenous contrast. COMPARISON:  Head CT 09/26/2015 FINDINGS: Brain: There is a large amount of diffusion restriction throughout the right hemisphere, within the MCA territory. There is hyperintense T2 weighted signal throughout the right MCA distribution and much of the left hemispheric white matter. There is a large area of encephalomalacia within the left parietal lobe. There is diffuse volume loss and ex vacuo dilatation of ventricles. Bifrontal encephalomalacia is also present. No intraparenchymal hematoma.  No mass effect or midline shift. The dura is normal and there is no extra-axial collection. Vascular: Diminished appearance of the right MCA flow void. Skull and upper cervical spine: Remote bifrontal craniotomy. Sinuses/Orbits: No fluid levels or advanced mucosal thickening. No mastoid effusion. Left-sided enucleation. IMPRESSION: 1. Large, acute right MCA territory infarct without associated mass effect or intraparenchymal hematoma. 2. Severe atrophy and multifocal encephalomalacia. These results will be called to the ordering clinician or representative by the Radiologist Assistant, and communication documented in the PACS or zVision Dashboard. Electronically Signed   By: Ulyses Jarred M.D.   On: 05/04/2017 17:34   Dg  Chest Port 1 View  Result Date: 04/29/2017 CLINICAL DATA:  Respiratory failure. EXAM: PORTABLE CHEST 1 VIEW COMPARISON:  04/26/2017 . FINDINGS: Interim advancement of the NG tube, its tip is below left hemidiaphragm. Endotracheal tube in stable position. Heart size stable. Interim partial clearing of bilateral pulmonary infiltrates/edema. Interim improvement of bibasilar atelectasis. No pleural effusion or pneumothorax . IMPRESSION: 1. Interim advancement of NG tube, its tip is below left hemidiaphragm. Endotracheal tube in stable position. 2. Interim partial clearing of bilateral pulmonary infiltrates/ edema. Interim improvement of bibasilar atelectasis. Electronically Signed   By: Marcello Moores  Register   On: 04/29/2017 06:35   Dg Chest Port 1 View  Result Date: 04/26/2017 CLINICAL DATA:  Respiratory failure. EXAM: PORTABLE CHEST 1 VIEW COMPARISON:  04/24/2017. FINDINGS: Endotracheal tube 2.3 cm above the carina. Endotracheal tube is again directed toward the right mainstem bronchus but again appears to be above the carina. NG tube noted with tip below left hemidiaphragm. Side hole is at gastroesophageal junction. Advancement of approximately 8 cm should be considered. Stable cardiomegaly. Diffuse right lung and left mid and lower lung infiltrates/edema are again noted. Low lung volumes with basilar atelectasis . Small bilateral pleural effusions. No pneumothorax. Prominent skin fold on right. Stable deformity of the proximal right humerus. IMPRESSION: 1. Endotracheal tube is again noted to be directed towards right mainstem bronchus but is 2.3 cm above the carina. 2. NG tube noted with tip below left hemidiaphragm. Side-port is at the gastroesophageal junction. Advancement of approximately 8 cm should be considered. 3. Diffuse right lung and left mid and lower lung infiltrates again noted. Low lung volumes with basilar atelectasis. Small bilateral pleural effusions. Electronically Signed   By: Marcello Moores  Register    On: 04/26/2017 06:39   Dg Chest Port 1 View  Result Date: 04/24/2017 CLINICAL DATA:  Acute respiratory failure. Evaluate reposition of endotracheal tube. EXAM: PORTABLE CHEST 1 VIEW COMPARISON:  Chest radiograph yesterday at 10 a.m. FINDINGS: Endotracheal tube is approximately 2.3 cm from the carina. Endotracheal tube again appears directed towards the right mainstem bronchus, similar to prior exam however is more than 2 cm above the carina. Enteric tube in place, tip and side-port below the diaphragm. Unchanged left lung base opacity likely combination of pleural fluid and airspace disease. Unchanged patchy opacities in the right lower lung zone, with possible progressive airspace disease in the right mid lung. Unchanged heart size and mediastinal contours. No pneumothorax. Chronic change at the right shoulder. IMPRESSION: 1. Endotracheal tube remains directed towards the right mainstem bronchus, however is at least 2 cm above the carina, similar in appearance to prior exam. 2. Patchy opacities in the right mid lung zone with possible progression. Right basilar airspace disease is grossly stable. 3. Unchanged left lung base opacity likely combination of pleural fluid and atelectasis/ pneumonia. Electronically Signed   By: Fonnie Birkenhead.D.  On: 04/24/2017 22:41   Dg Chest Port 1 View  Result Date: 04/23/2017 CLINICAL DATA:  Pleural effusion. EXAM: PORTABLE CHEST 1 VIEW COMPARISON:  04/22/2017 FINDINGS: Endotracheal tube is still in place. The carina is poorly visualized on this examination and tube is pointing towards the right side of the chest. However, the tube may actually be above the carina. Again noted are bibasilar chest densities compatible with airspace disease and consolidation. Concern for increased consolidation at the left lung base. Cannot exclude left pleural fluid. Negative for pneumothorax. Nasogastric tube extends into the stomach and the tip is beyond the image. Heart size is  stable. IMPRESSION: Persistent bibasilar chest densities suggestive for airspace and consolidation. Cannot exclude pleural fluid, particularly on the left side. Concern for increased disease in left lung base. Difficult to evaluate the location of the endotracheal tube tip. The tip is pointing towards the right mainstem bronchus but it appears to be above the carina. These findings were discussed with the patient's nurse at 11:24 a.m. on 04/24/2015. Electronically Signed   By: Markus Daft M.D.   On: 04/23/2017 11:25   Dg Chest Port 1 View  Result Date: 04/22/2017 CLINICAL DATA:  Respiratory failure EXAM: PORTABLE CHEST 1 VIEW COMPARISON:  04/21/2017 FINDINGS: Cardiac shadow is stable. Endotracheal tube and nasogastric catheter are again seen and stable. Persistent bilateral infiltrates are seen and stable from the prior study. No new focal abnormality is seen. No bony abnormality is noted. IMPRESSION: Stable bilateral infiltrates. Electronically Signed   By: [name] M.D.   On: 04/22/2017 07:16   Dg Chest Port 1 View  Result Date: 04/21/2017 CLINICAL DATA:  Respiratory failure. EXAM: PORTABLE CHEST 1 VIEW COMPARISON:  04/20/2017 FINDINGS: The patient is rotated to the right. The endotracheal tube is approximately 2 cm above the carina. Enteric tube courses into the left upper abdomen with tip not imaged. The cardiomediastinal silhouette is unchanged. Right greater than left perihilar and bibasilar airspace opacities have mildly increased. There may be small bilateral pleural effusions. No definite pneumothorax is identified, although the patient's chin obscures the right lung apex. IMPRESSION: Mild worsening of bilateral airspace disease. Electronically Signed   By: Logan Bores M.D.   On: 04/21/2017 07:22   Dg Chest Port 1 View  Result Date: 04/20/2017 CLINICAL DATA:  Endotracheal tube evaluation. EXAM: PORTABLE CHEST 1 VIEW COMPARISON:  04/20/2017 and prior radiographs FINDINGS: The  cardiomediastinal silhouette is unchanged. An endotracheal tube with tip 2.5 cm above the carina. An NG tube is identified entering the stomach with tip off the field of view. Bilateral lower lung airspace disease/consolidation again noted. There is no evidence of pneumothorax. IMPRESSION: Unchanged appearance of the chest. Electronically Signed   By: Margarette Canada M.D.   On: 04/20/2017 18:06   Dg Chest Port 1 View  Result Date: 04/20/2017 CLINICAL DATA:  resp failure, ETT positin EXAM: PORTABLE CHEST 1 VIEW COMPARISON:  Chest x-rays dated 04/19/2017 and 04/18/2017. FINDINGS: Endotracheal tube remains well positioned with tip approximately 2 cm above the carina. Enteric tube passes below the diaphragm. Heart size and mediastinal contours appear stable. Perihilar and bibasilar opacities appear stable. No pneumothorax seen. Probable small bilateral pleural effusions. IMPRESSION: 1. Endotracheal tube well positioned with tip approximately 2 cm above the carina. 2. Enteric tube in the stomach, however, the tube has retracted slightly in the interval and proximal side holes are now at or near the gastroesophageal junction. Recommend advancement of at least 5 cm for optimal radiographic positioning. 3.  Stable perihilar and bibasilar opacities, most likely pulmonary edema, pneumonia not excluded if febrile. These results will be called to the ordering clinician or representative by the Radiologist Assistant, and communication documented in the PACS or zVision Dashboard. Electronically Signed   By: Franki Cabot M.D.   On: 04/20/2017 06:53   Dg Chest Port 1 View  Result Date: 04/19/2017 CLINICAL DATA:  83 year old male respiratory failure. Subsequent encounter. EXAM: PORTABLE CHEST 1 VIEW COMPARISON:  04/18/2017. FINDINGS: Endotracheal tube tip 2.4 cm above the carina. Nasogastric tube tip left upper quadrant of the abdomen at the expected level of the gastric fundus. Patchy consolidation perihilar/ basilar region  bilaterally greater on the right. This may represent infectious process versus asymmetric pulmonary edema. Heart size top-normal.  Mildly tortuous aorta. Remote right humeral head fracture. IMPRESSION: Minimal change in appearance of patchy consolidation most notable perihilar and basilar region greater on the right. This may represent infectious process in the proper clinical setting with asymmetric pulmonary edema a secondary consideration. Electronically Signed   By: Genia Del M.D.   On: 04/19/2017 07:02   Dg Chest Port 1 View  Result Date: 04/18/2017 CLINICAL DATA:  Initial evaluation for acute respiratory failure. EXAM: PORTABLE CHEST 1 VIEW COMPARISON:  Prior radiograph from 04/16/2017. FINDINGS: Patient is intubated with the tip of an endotracheal tube positioned 2.5 cm above the carina. Enteric tube overlies the stomach. Stable cardiomegaly. Mediastinal silhouette normal. Lungs hypoinflated. Persistent layering bilateral pleural effusions with associated bibasilar opacities, atelectasis and/ or infiltrates. Mild perihilar congestion without overt pulmonary edema. No pneumothorax. No acute osseus abnormality. IMPRESSION: 1. Endotracheal tube in place with tip positioned approximately 2.5 cm above the carina. 2. Persistent layering bilateral pleural effusions with bibasilar opacities, either atelectasis or infiltrates. 3. Perihilar vascular congestion without overt pulmonary edema. Electronically Signed   By: Jeannine Boga M.D.   On: 04/18/2017 05:47   Dg Chest Port 1 View  Result Date: 04/16/2017 CLINICAL DATA:  Respiratory failure EXAM: PORTABLE CHEST 1 VIEW COMPARISON:  04/12/2017 FINDINGS: Patchy bilateral lower lobe opacities, atelectasis versus pneumonia. Superimposed layering bilateral pleural effusions. Mild frank interstitial edema is suspected. The heart is normal in size. IMPRESSION: Mild interstitial edema with layering bilateral pleural effusion. Underlying patchy bilateral lower  lobe opacities, atelectasis versus pneumonia. Electronically Signed   By: Julian Hy M.D.   On: 04/16/2017 23:57   Dg Chest Port 1 View  Result Date: 04/12/2017 CLINICAL DATA:  Hypotension.  Dementia. EXAM: PORTABLE CHEST 1 VIEW COMPARISON:  03/24/2017 and 02/08/2008 FINDINGS: Patient is slightly rotated to the left. Lungs are adequately inflated and demonstrate mild opacification over the left base without significant change which may be due to atelectasis or infection. No evidence of effusion. Cardiomediastinal silhouette and remainder of the exam is unchanged. IMPRESSION: Stable mild opacification over the left base which may be due to atelectasis or infection. Electronically Signed   By: Marin Olp M.D.   On: 04/12/2017 18:00   Dg Abd Portable 1v  Result Date: 04/30/2017 CLINICAL DATA:  NG tube EXAM: PORTABLE ABDOMEN - 1 VIEW COMPARISON:  04/28/2017 FINDINGS: NG tube tip is in distal stomach. Scattered air throughout the bowel without obstruction. Lung bases remain clear. Normal heart size. Retracted arms overlie the abdomen. Degenerative changes noted of the spine with an associated scoliosis. Bones are osteopenic. IMPRESSION: NG tube within the distal stomach. Nonobstructive bowel gas pattern. Limited exam Electronically Signed   By: Jerilynn Mages.  Shick M.D.   On: 04/30/2017 13:07  Microbiology: No results found for this or any previous visit (from the past 240 hour(s)).   Labs: Basic Metabolic Panel:  Recent Labs Lab 04/29/17 0907 04/30/17 0344 05/04/17 1204  NA 132* 133* 139  K 3.7 4.5 4.1  CL 100* 101 104  CO2 26 24 27   GLUCOSE 105* 100* 101*  BUN 22* 23* 27*  CREATININE 1.81* 1.81* 1.03  CALCIUM 7.9* 7.9* 8.5*  MG 2.1 2.1  --   PHOS 3.5 3.4  --    Liver Function Tests: No results for input(s): AST, ALT, ALKPHOS, BILITOT, PROT, ALBUMIN in the last 168 hours. No results for input(s): LIPASE, AMYLASE in the last 168 hours. No results for input(s): AMMONIA in the last 168  hours. CBC:  Recent Labs Lab 04/29/17 1141 04/30/17 0344 05/04/17 1400  WBC 7.2 7.4 6.2  HGB 7.5* 7.3* 7.0*  HCT 21.9* 20.9* 20.8*  MCV 86.6 86.7 87.0  PLT 327 366 230   Cardiac Enzymes: No results for input(s): CKTOTAL, CKMB, CKMBINDEX, TROPONINI in the last 168 hours. BNP: BNP (last 3 results) No results for input(s): BNP in the last 8760 hours.  ProBNP (last 3 results) No results for input(s): PROBNP in the last 8760 hours.  CBG:  Recent Labs Lab 04/30/17 2336 05/01/17 0401 05/01/17 0753 05/01/17 1207 05/01/17 1841  GLUCAP 109* 91 76 94 116*       Signed:  Clem Wisenbaker MD, PhD  [facility] 05/06/2017, 8:20 AM

## 2017-05-06 NOTE — Consult Note (Signed)
[place]: Pt is in room responds very little. Does open his eyes some with conversation. He does not follow any commands. Dt. Willey Blade arrived. Hospice consents reviewed and she is in agreement with pt's going to hospice home in [place]. Ambulance calle dby Jamie in [place] for pt to be picked up. Webb Silversmith RN

## 2017-05-06 NOTE — Care Management Note (Signed)
Case Management Note  Patient Details  Name: GEORDIE NOONEY MRN: [id number] Date of Birth: 07-Jan-1929  Subjective/Objective:   VDRF, HCAP, Acute Metabolic Encephalopathy                 Action/Plan: Discharge Planning: Chart reviewed. [facility]. Scheduled dc today.    Expected Discharge Date:  05/06/17               Expected Discharge Plan:  [place]  In-House Referral:  Clinical Social Work  Discharge planning Services  CM Consult  Post Acute Care Choice:  NA Choice offered to:  NA  DME Arranged:  N/A DME Agency:  NA  HH Arranged:  NA HH Agency:  NA  Status of Service:  Completed, signed off  If discussed at [employer] of Stay Meetings, dates discussed:    Additional Comments:  Erenest Rasher, RN 05/06/2017, 3:40 PM

## 2017-05-06 NOTE — Progress Notes (Signed)
Pt ready to d/c to [facility]ospice [facility] at [facility]. Pt's daughter is in agreement with d/c plan. D/C Summary sent to facility for review. [facility] transport required. Medical necessity form completed. # for report provided to nsg.  Werner Lean LCSW [phone]

## 2017-06-08 DEATH — deceased

## 2018-03-15 IMAGING — DX DG ABDOMEN 1V
1 series · 1 of 1 positions shown · non-contrast
Comparison: April 26, 2017

CLINICAL DATA: Orogastric tube placement

EXAM:
ABDOMEN - 1 VIEW

[abdomen kub]
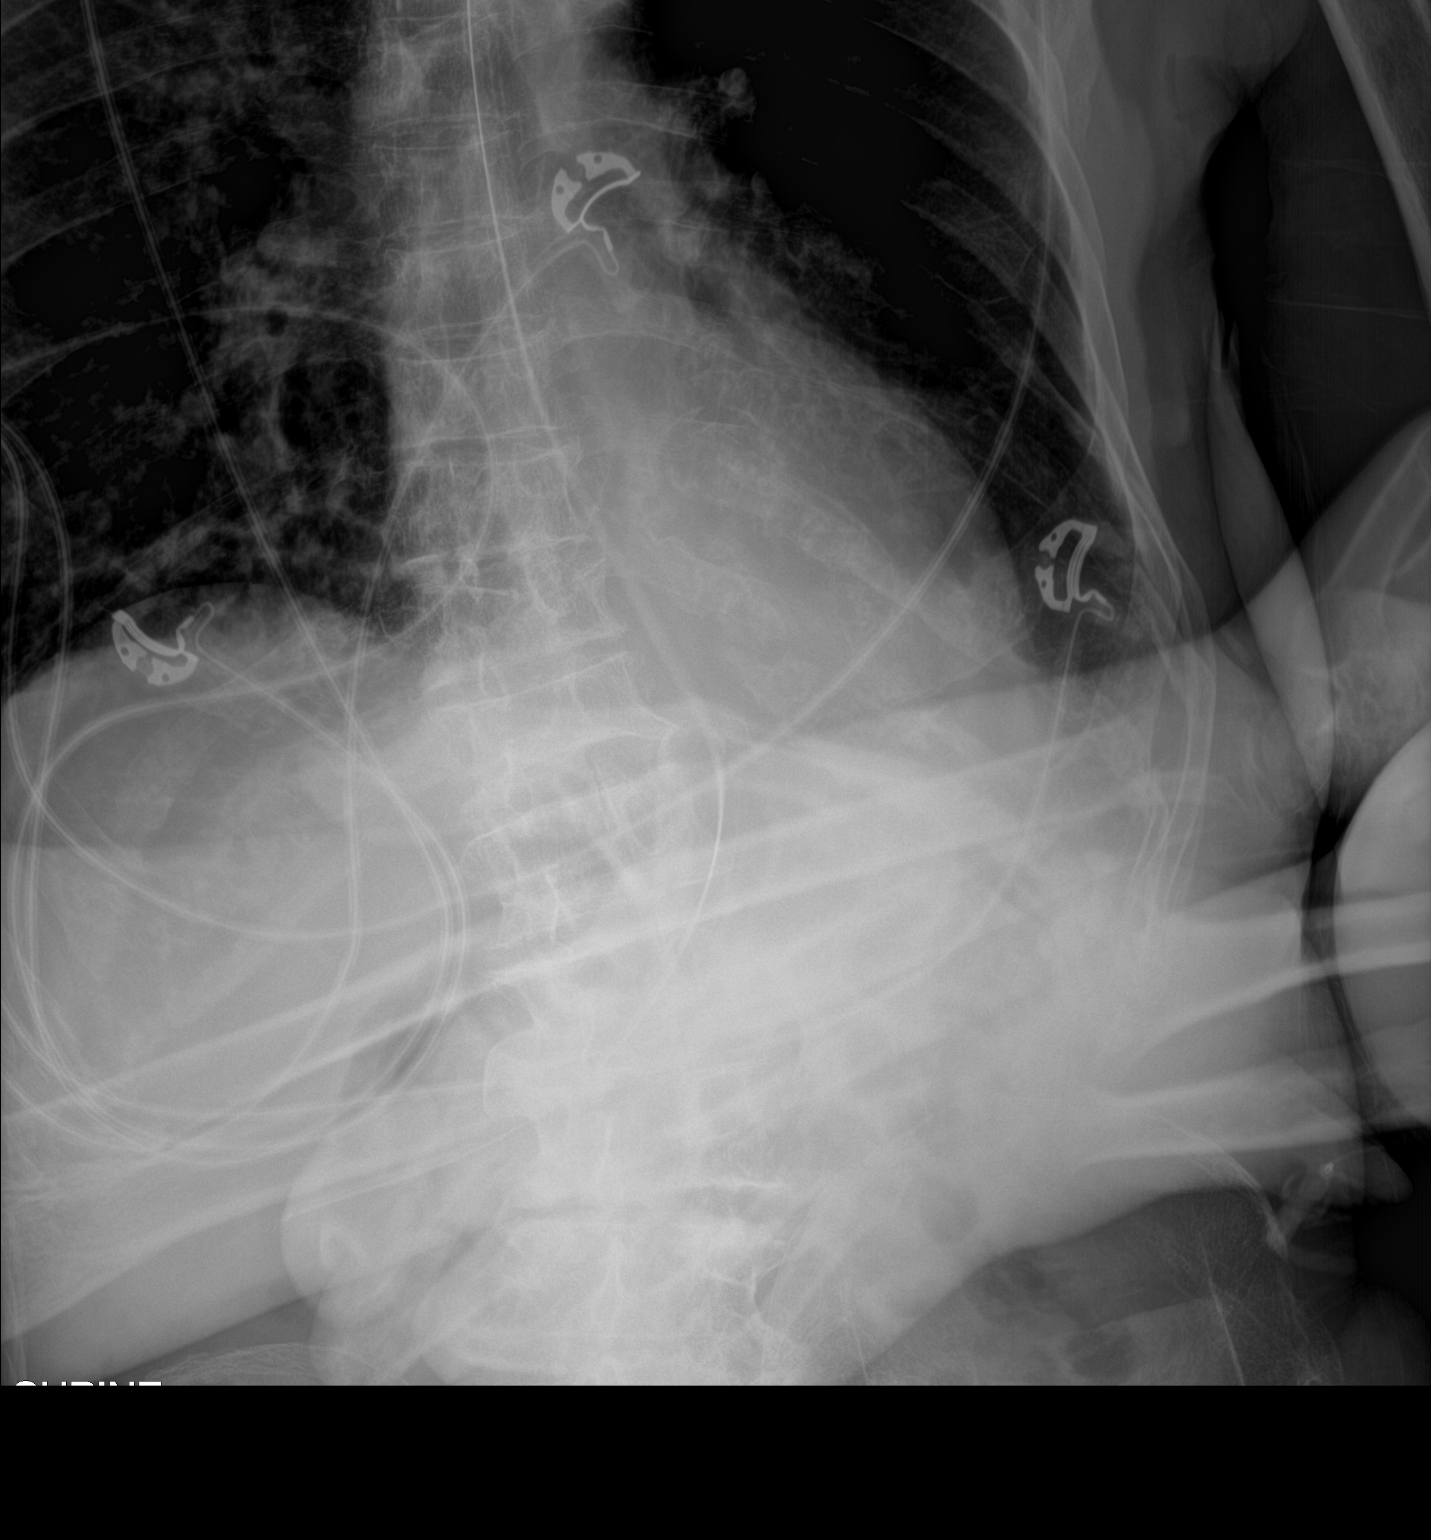

[1 of 1 positions shown; findings below may reference images not displayed]

FINDINGS: Orogastric tube tip and side port are in the stomach. Bowel gas
pattern unremarkable. Visualized lung bases clear. No free air.
IMPRESSION: Orogastric tube tip and side port in stomach. Visualized bowel gas
pattern unremarkable.

## 2018-03-16 IMAGING — DX DG CHEST 1V PORT
1 series · 1 of 1 positions shown · non-contrast
Comparison: 04/26/2017 .

CLINICAL DATA: Respiratory failure.

EXAM:
PORTABLE CHEST 1 VIEW

[chest ap]
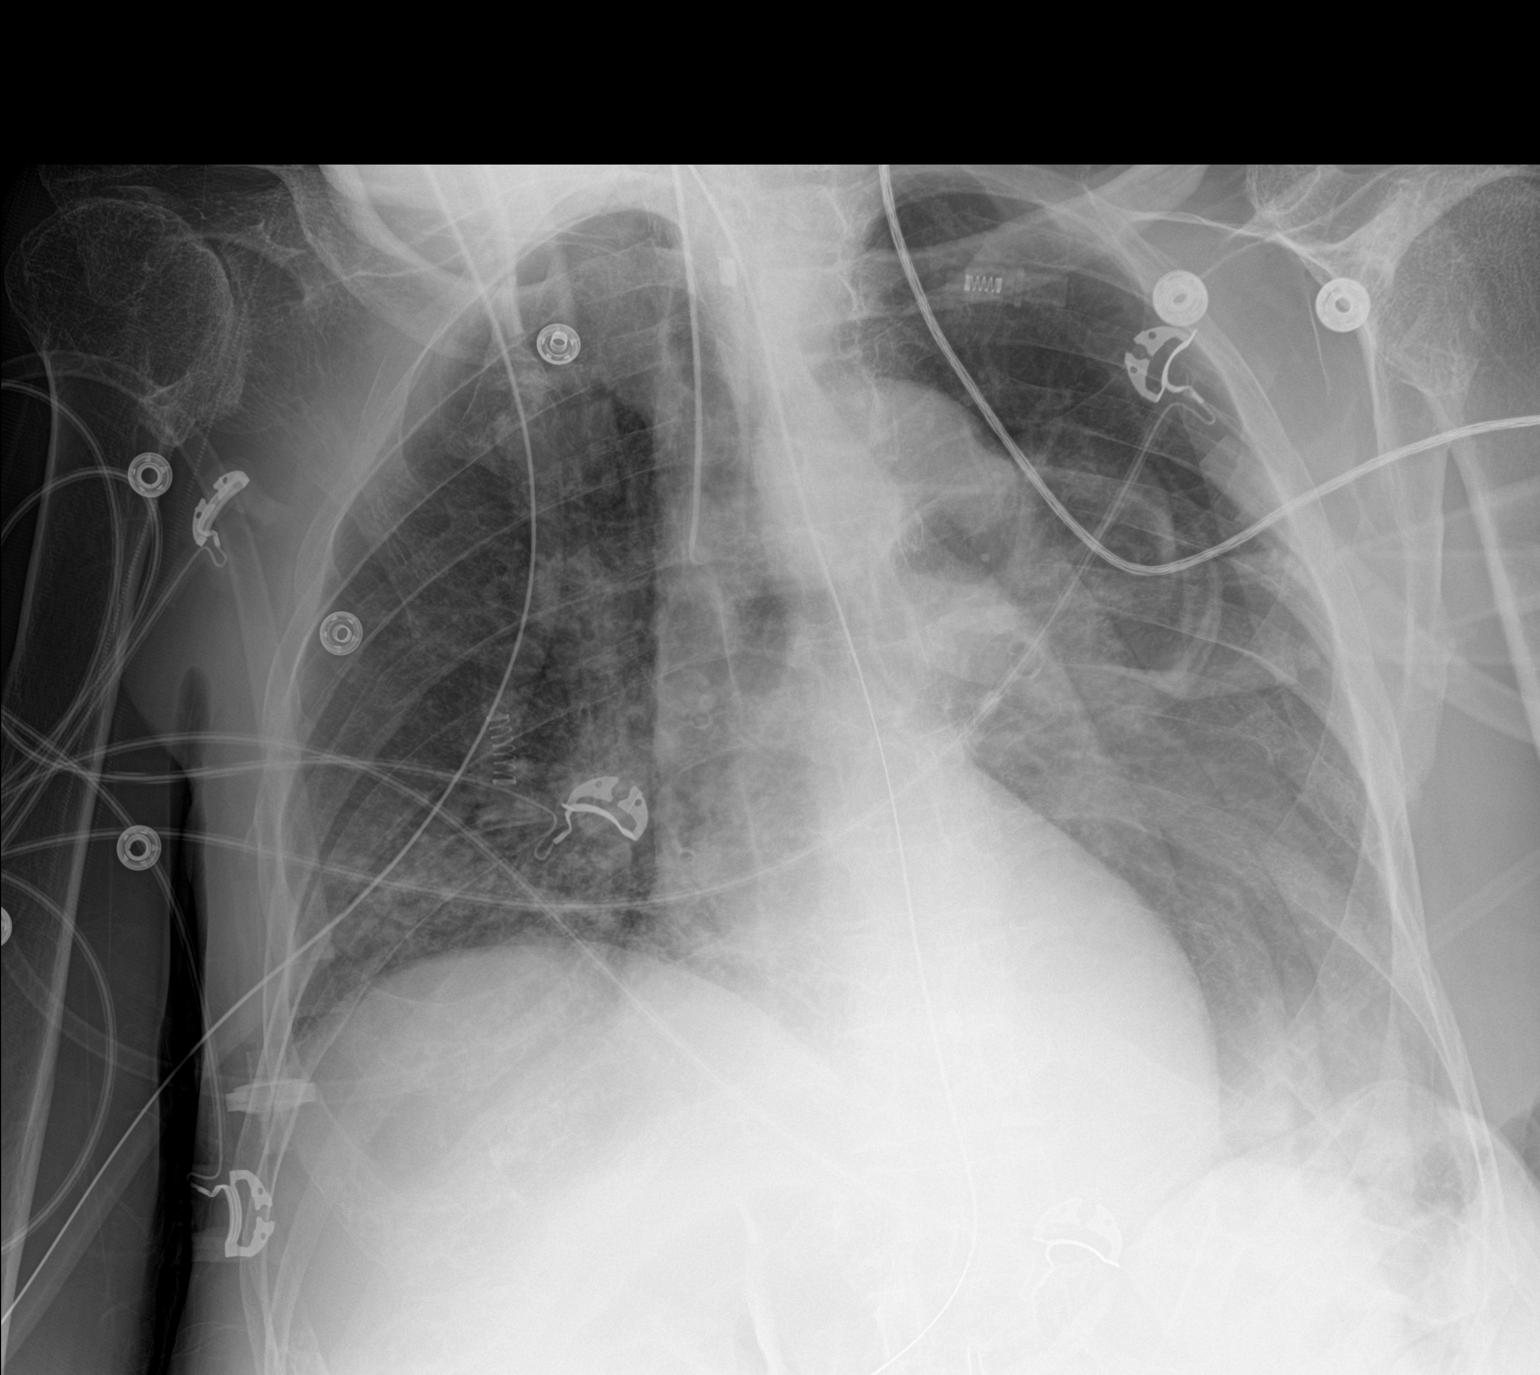

[1 of 1 positions shown; findings below may reference images not displayed]

FINDINGS: Interim advancement of the NG tube, its tip is below left
hemidiaphragm. Endotracheal tube in stable position. Heart size
stable. Interim partial clearing of bilateral pulmonary
infiltrates/edema. Interim improvement of bibasilar atelectasis. No
pleural effusion or pneumothorax .
IMPRESSION: 1. Interim advancement of NG tube, its tip is below left
hemidiaphragm. Endotracheal tube in stable position.

2. Interim partial clearing of bilateral pulmonary infiltrates/
edema. Interim improvement of bibasilar atelectasis.

## 2018-03-21 IMAGING — MR MR HEAD W/O CM
8 of 10 series · 36 of 48 positions shown · non-contrast
Comparison: Head CT 09/26/2015

CLINICAL DATA: Dementia and encephalopathy.

EXAM:
MRI HEAD WITHOUT CONTRAST
TECHNIQUE: Multiplanar, multiecho pulse sequences of the brain and surrounding
structures were obtained without intravenous contrast.

[Series 4: T1 · sagittal · 5.0mm · 0.47mm/px · 2 of 24 slices shown]
[im 1/24]
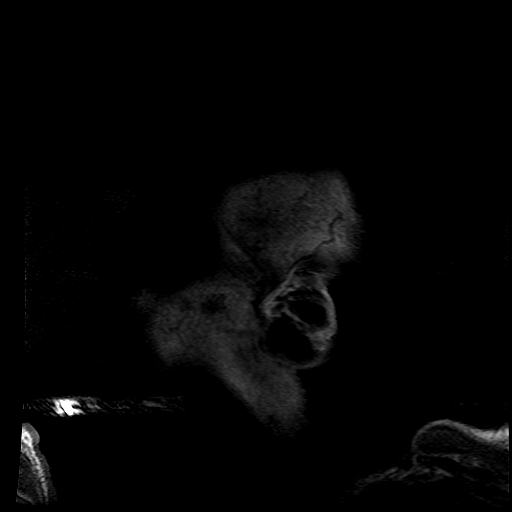
[im 8/24]
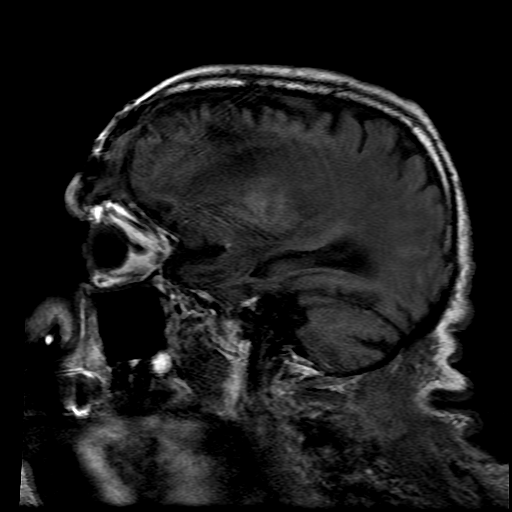

[Series 5: DWI · axial · 4.0mm · 1.17mm/px · z∈[-62,+80]mm · 9 of 68 slices shown (1 of 4)]
[im 1/68]
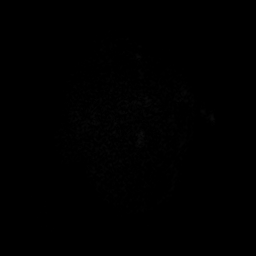
[im 9/68]
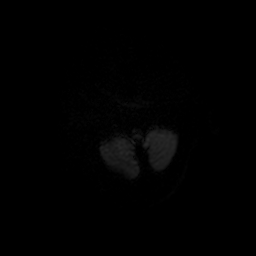
[im 17/68]
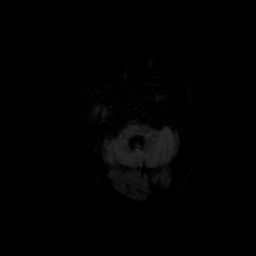
[im 26/68]
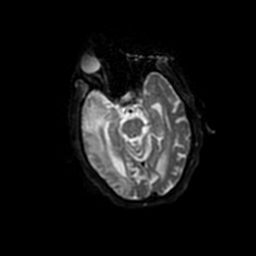
[im 34/68]
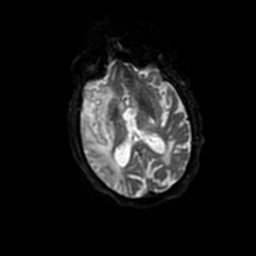
[im 42/68]
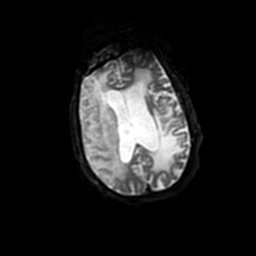
[im 51/68]
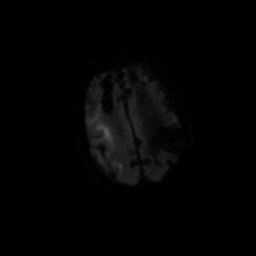
[im 59/68]
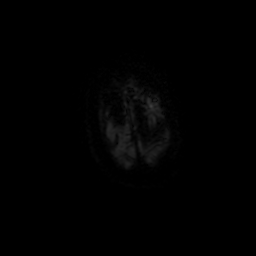
[im 68/68]
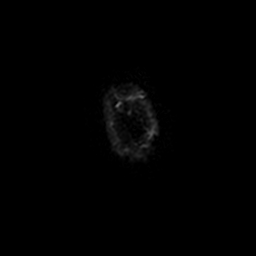

[Series 6: DWI · coronal · 5.0mm · 1.09mm/px · 8 of 68 slices shown (2 of 4)]
[im 1/68]
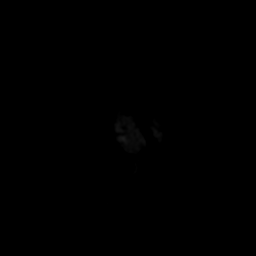
[im 10/68]
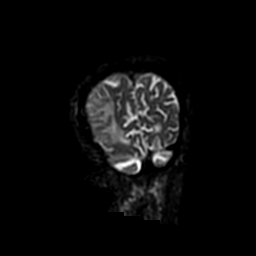
[im 20/68]
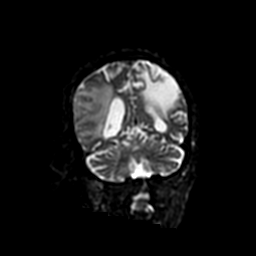
[im 29/68]
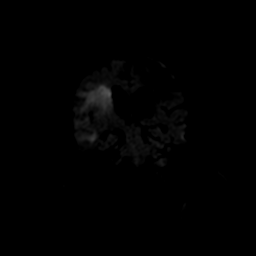
[im 39/68]
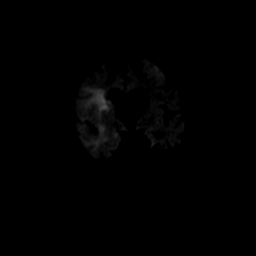
[im 48/68]
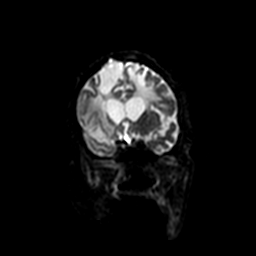
[im 58/68]
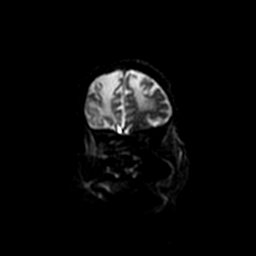
[im 68/68]
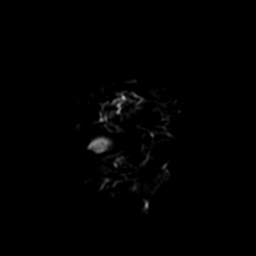

[Series 7: T2 · axial · 5.0mm · 0.43mm/px · z∈[-61,+87]mm · 3 of 27 slices shown (1 of 2)]
[im 1/27]
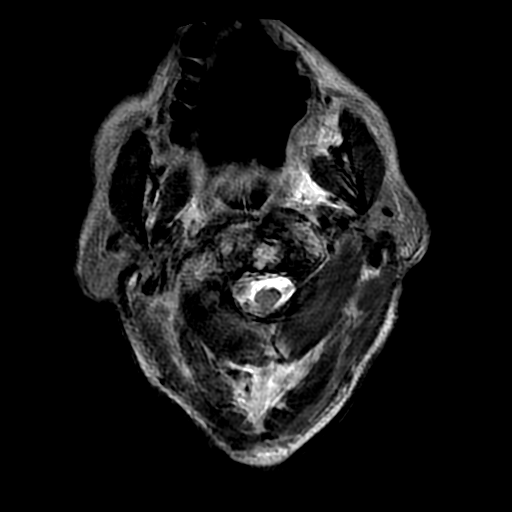
[im 14/27]
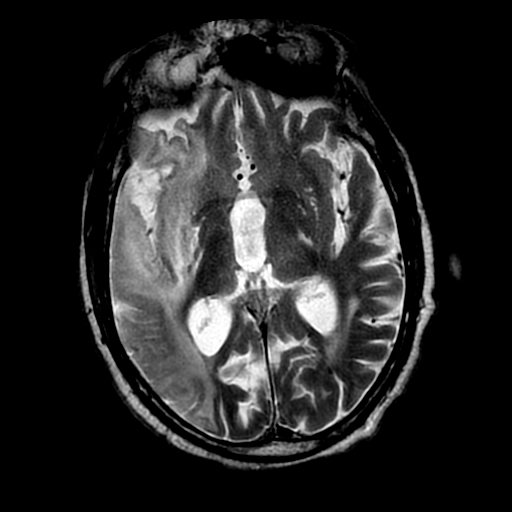
[im 27/27]
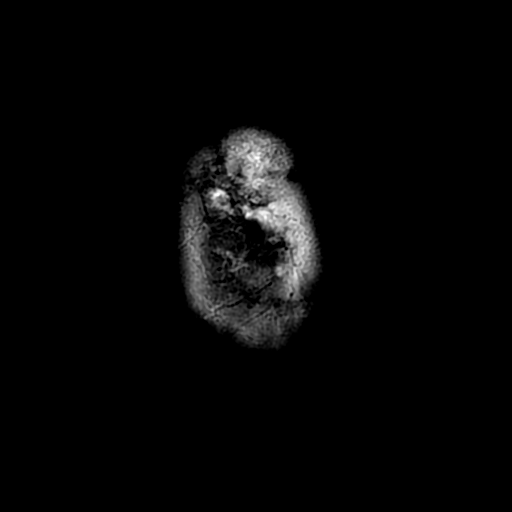

[Series 8: FLAIR · axial · 5.0mm · 0.43mm/px · z∈[-61,+87]mm · 3 of 27 slices shown]
[im 1/27]
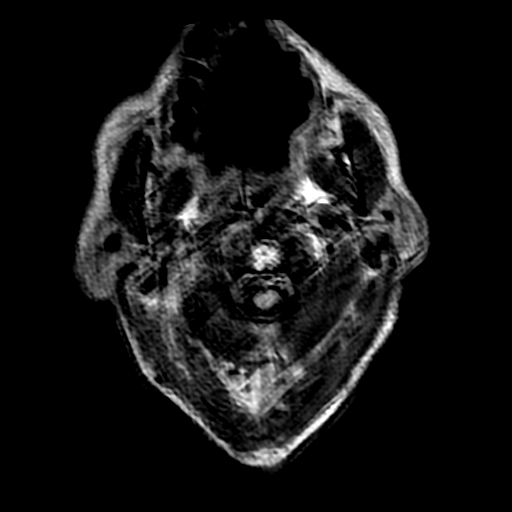
[im 14/27]
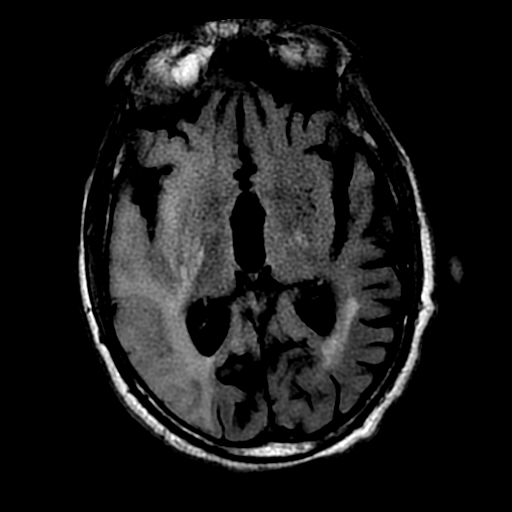
[im 27/27]
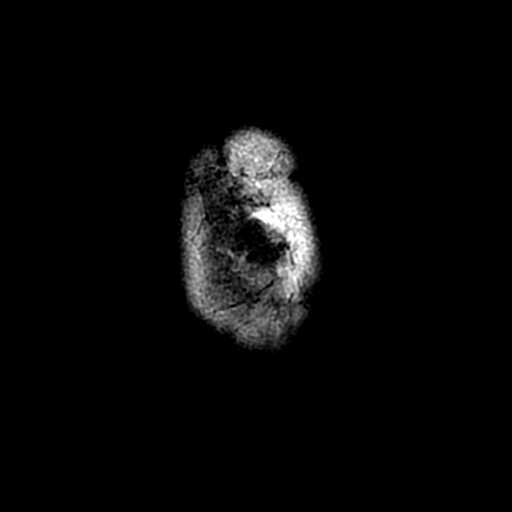

[Series 11: T2 · coronal · 5.0mm · 0.45mm/px · 3 of 27 slices shown (2 of 2)]
[im 1/27]
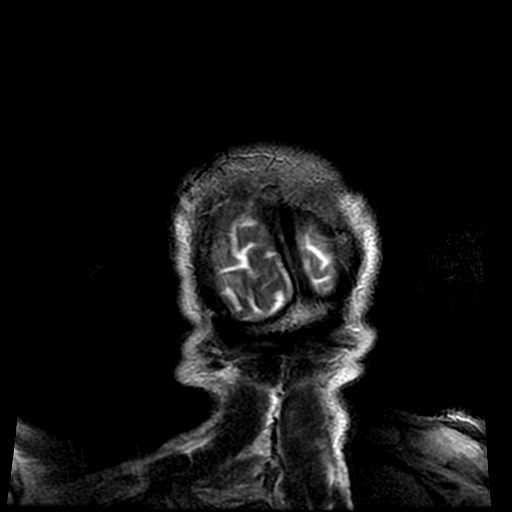
[im 14/27]
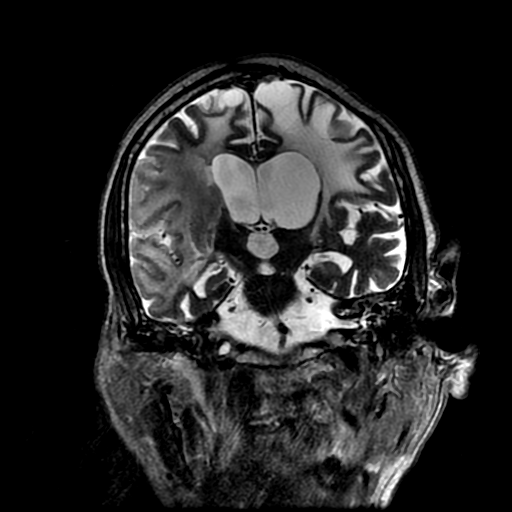
[im 27/27]
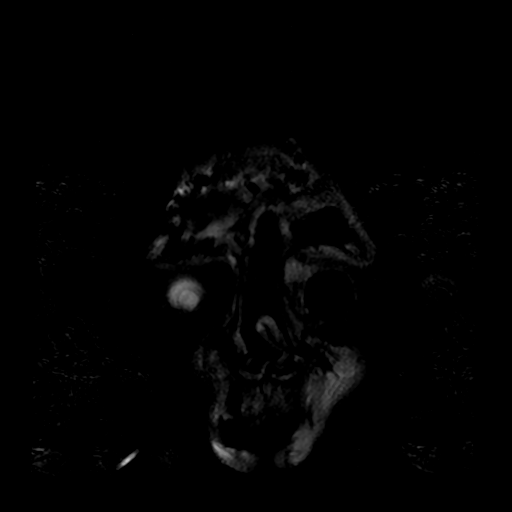

[Series 500: DWI · axial · 4.0mm · 1.17mm/px · z∈[-62,+80]mm · 4 of 34 slices shown (3 of 4)]
[im 1/34]
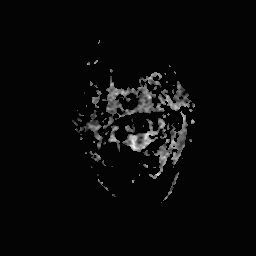
[im 12/34]
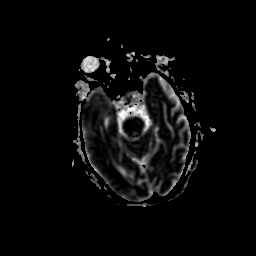
[im 23/34]
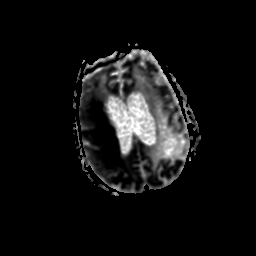
[im 34/34]
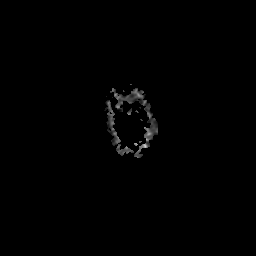

[Series 600: DWI · coronal · 5.0mm · 1.09mm/px · 4 of 34 slices shown (4 of 4)]
[im 1/34]
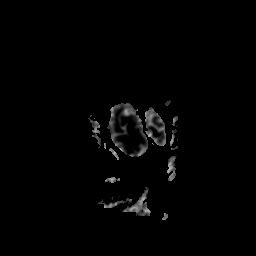
[im 12/34]
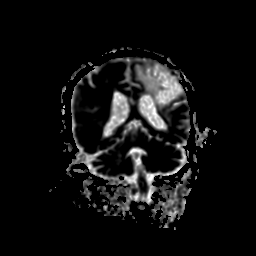
[im 23/34]
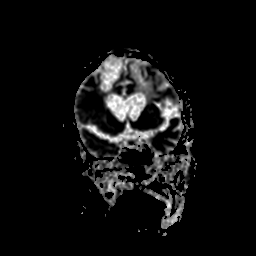
[im 34/34]
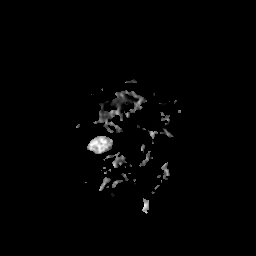

[36 of 48 positions shown; findings below may reference images not displayed]

FINDINGS: Brain: There is a large amount of diffusion restriction throughout
the right hemisphere, within the MCA territory. There is
hyperintense T2 weighted signal throughout the right MCA
distribution and much of the left hemispheric white matter. There is
a large area of encephalomalacia within the left parietal lobe.
There is diffuse volume loss and ex vacuo dilatation of ventricles.
Bifrontal encephalomalacia is also present. No intraparenchymal
hematoma. No mass effect or midline shift. The dura is normal and
there is no extra-axial collection.

Vascular: Diminished appearance of the right MCA flow void.

Skull and upper cervical spine: Remote bifrontal craniotomy.

Sinuses/Orbits: No fluid levels or advanced mucosal thickening. No
mastoid effusion. Left-sided enucleation.
IMPRESSION: 1. Large, acute right MCA territory infarct without associated mass
effect or intraparenchymal hematoma.
2. Severe atrophy and multifocal encephalomalacia.
These results will be called to the ordering clinician or
representative by the Radiologist Assistant, and communication
documented in the PACS or zVision Dashboard.
